# Patient Record
Sex: Female | Born: 1940
Health system: Southern US, Community
[De-identification: ages and names within clinical notes are randomized; demographics above are authoritative.]

## PROBLEM LIST (undated history)

## (undated) DIAGNOSIS — I739 Peripheral vascular disease, unspecified: Secondary | ICD-10-CM

## (undated) DIAGNOSIS — I809 Phlebitis and thrombophlebitis of unspecified site: Secondary | ICD-10-CM

## (undated) DIAGNOSIS — J189 Pneumonia, unspecified organism: Secondary | ICD-10-CM

## (undated) DIAGNOSIS — K219 Gastro-esophageal reflux disease without esophagitis: Secondary | ICD-10-CM

## (undated) DIAGNOSIS — I10 Essential (primary) hypertension: Secondary | ICD-10-CM

## (undated) DIAGNOSIS — D649 Anemia, unspecified: Secondary | ICD-10-CM

## (undated) DIAGNOSIS — Z87828 Personal history of other (healed) physical injury and trauma: Secondary | ICD-10-CM

## (undated) DIAGNOSIS — T7840XA Allergy, unspecified, initial encounter: Secondary | ICD-10-CM

## (undated) DIAGNOSIS — M1990 Unspecified osteoarthritis, unspecified site: Secondary | ICD-10-CM

## (undated) DIAGNOSIS — L0390 Cellulitis, unspecified: Secondary | ICD-10-CM

## (undated) DIAGNOSIS — S46009A Unspecified injury of muscle(s) and tendon(s) of the rotator cuff of unspecified shoulder, initial encounter: Secondary | ICD-10-CM

## (undated) HISTORY — DX: Phlebitis and thrombophlebitis of unspecified site: I80.9

## (undated) HISTORY — PX: COLONOSCOPY: SHX174

## (undated) HISTORY — DX: Allergy, unspecified, initial encounter: T78.40XA

## (undated) HISTORY — DX: Essential (primary) hypertension: I10

## (undated) HISTORY — DX: Pneumonia, unspecified organism: J18.9

## (undated) HISTORY — DX: Unspecified injury of muscle(s) and tendon(s) of the rotator cuff of unspecified shoulder, initial encounter: S46.009A

## (undated) HISTORY — DX: Personal history of other (healed) physical injury and trauma: Z87.828

---

## 1979-12-13 HISTORY — PX: ABDOMINAL HYSTERECTOMY: SHX81

## 2005-01-18 ENCOUNTER — Ambulatory Visit: Payer: Self-pay

## 2005-03-17 ENCOUNTER — Ambulatory Visit: Payer: Self-pay

## 2005-12-02 ENCOUNTER — Ambulatory Visit: Payer: Self-pay

## 2006-05-10 ENCOUNTER — Ambulatory Visit: Payer: Self-pay | Admitting: Internal Medicine

## 2006-07-18 ENCOUNTER — Ambulatory Visit: Payer: Self-pay | Admitting: Gastroenterology

## 2007-01-12 HISTORY — PX: OTHER SURGICAL HISTORY: SHX169

## 2007-04-09 ENCOUNTER — Ambulatory Visit: Payer: Self-pay | Admitting: Internal Medicine

## 2008-05-16 ENCOUNTER — Ambulatory Visit: Payer: Self-pay | Admitting: Internal Medicine

## 2008-05-16 ENCOUNTER — Ambulatory Visit: Payer: Self-pay | Admitting: Family Medicine

## 2008-08-25 ENCOUNTER — Ambulatory Visit: Payer: Self-pay | Admitting: Internal Medicine

## 2010-11-08 ENCOUNTER — Ambulatory Visit: Payer: Self-pay | Admitting: Internal Medicine

## 2011-09-25 ENCOUNTER — Ambulatory Visit: Payer: Self-pay | Admitting: Orthopedic Surgery

## 2011-12-03 ENCOUNTER — Emergency Department: Payer: Self-pay | Admitting: Emergency Medicine

## 2012-12-12 DIAGNOSIS — I809 Phlebitis and thrombophlebitis of unspecified site: Secondary | ICD-10-CM

## 2012-12-12 HISTORY — DX: Phlebitis and thrombophlebitis of unspecified site: I80.9

## 2013-08-28 ENCOUNTER — Encounter: Payer: Self-pay | Admitting: General Surgery

## 2013-08-28 ENCOUNTER — Ambulatory Visit (INDEPENDENT_AMBULATORY_CARE_PROVIDER_SITE_OTHER): Payer: Medicare Other | Admitting: General Surgery

## 2013-08-28 VITALS — BP 150/90 | HR 84 | Resp 16 | Ht 64.0 in | Wt 208.0 lb

## 2013-08-28 DIAGNOSIS — I83893 Varicose veins of bilateral lower extremities with other complications: Secondary | ICD-10-CM

## 2013-08-28 NOTE — Progress Notes (Signed)
Patient ID: Ann Spencer, female   DOB: 05-04-1941, 72 y.o.   MRN: [id number]  Chief Complaint  Patient presents with  . Other    phlebitis    HPI Ann Spencer is a 72 y.o. female. Here today for lower leg evaluation.  Was treated with meloxicam at [facility] for phlebitis in both legs left > right on 08-02-13. States her lower legs were red swollen, inflamed and sore. She states they are still sore and not as inflamed. She continues to wear her compression hose.   HPI  Past Medical History  Diagnosis Date  . Phlebitis 2014    Past Surgical History  Procedure Laterality Date  . Abdominal hysterectomy  1981  . Vein closure Bilateral Feb 2008    History reviewed. No pertinent family history.  Social History History  Substance Use Topics  . Smoking status: Former Smoker -- 30 years    Quit date: 12/12/1993  . Smokeless tobacco: Never Used  . Alcohol Use: No    Allergies  Allergen Reactions  . Penicillins     Current Outpatient Prescriptions  Medication Sig Dispense Refill  . aspirin 81 MG tablet Take 81 mg by mouth daily.      . Calcium Carbonate (CALTRATE 600 PO) Take 1 tablet by mouth daily.      . ferrous sulfate 325 (65 FE) MG EC tablet Take 325 mg by mouth daily with breakfast.      . Multiple Vitamin (MULTIVITAMIN) capsule Take 1 capsule by mouth daily.      Marland Kitchen omeprazole (PRILOSEC) 20 MG capsule Take 20 mg by mouth daily.       No current facility-administered medications for this visit.    Review of Systems Review of Systems  Constitutional: Negative.   Respiratory: Negative.   Cardiovascular: Negative.     Blood pressure 150/90, pulse 84, resp. rate 16, height 5\' 4"  (1.626 m), weight 208 lb (94.348 kg).  Physical Exam Physical Exam  Constitutional: She is oriented to person, place, and time. She appears well-developed and well-nourished.  Neck: No tracheal deviation present. No thyromegaly present.  Cardiovascular: Normal rate, regular  rhythm and normal heart sounds.   Pulses:      Dorsalis pedis pulses are 2+ on the right side, and 2+ on the left side.       Posterior tibial pulses are 2+ on the right side, and 2+ on the left side.  Edema noted left leg.  Left side inner aspect has an area of induration with mild discoloration and tenderness.  Pulmonary/Chest: Effort normal and breath sounds normal.  Neurological: She is alert and oriented to person, place, and time.  Skin: Skin is warm and dry.    Data Reviewed Duplex study of left leg shows no DVT but she has recanalised GSV with reflux.  Assessment    Stasis dermatitis left leg with potential for ulceration.      Plan    Discussed Ann Spencer boot -she was agreeable. This is to be changed next week. Will reassess in 2 weeks.      Venous Duplex study of left lower extremity. Was performed. The deep system -CFV,FV and PV- are all normal with no clots seen, normal compression and doppler flow . Left GSV has recanalised and has reflux in excess of 3.75 secs.  LSV is normal.  Ann Spencer G 09/03/2013, 7:57 AM

## 2013-08-28 NOTE — Patient Instructions (Addendum)
The patient is aware to call back for any questions or concerns. Left leg Unna boot change in 1 week

## 2013-09-03 ENCOUNTER — Encounter: Payer: Self-pay | Admitting: General Surgery

## 2013-09-03 DIAGNOSIS — I8393 Asymptomatic varicose veins of bilateral lower extremities: Secondary | ICD-10-CM | POA: Insufficient documentation

## 2013-09-04 ENCOUNTER — Ambulatory Visit (INDEPENDENT_AMBULATORY_CARE_PROVIDER_SITE_OTHER): Payer: Medicare Other | Admitting: *Deleted

## 2013-09-04 DIAGNOSIS — I83893 Varicose veins of bilateral lower extremities with other complications: Secondary | ICD-10-CM

## 2013-09-04 NOTE — Progress Notes (Signed)
The patient came in today for unna boot dressing change.  Left  leg were washed with soap and water.  Unna boot, kerlix and coban applied.  Edema improving.The area of concern is improving. Less redness noted. Follow up as scheduled.

## 2013-09-04 NOTE — Patient Instructions (Signed)
The patient is aware to call back for any questions or concerns.  

## 2013-09-11 ENCOUNTER — Telehealth: Payer: Self-pay | Admitting: *Deleted

## 2013-09-11 ENCOUNTER — Ambulatory Visit (INDEPENDENT_AMBULATORY_CARE_PROVIDER_SITE_OTHER): Payer: Medicare Other | Admitting: General Surgery

## 2013-09-11 ENCOUNTER — Encounter: Payer: Self-pay | Admitting: General Surgery

## 2013-09-11 VITALS — BP 164/94 | HR 78 | Resp 16 | Ht 64.0 in | Wt 206.0 lb

## 2013-09-11 DIAGNOSIS — I83893 Varicose veins of bilateral lower extremities with other complications: Secondary | ICD-10-CM

## 2013-09-11 NOTE — Telephone Encounter (Signed)
Need to talk with her about appointments

## 2013-09-11 NOTE — Progress Notes (Signed)
Patient ID: Ann Spencer, female   DOB: Mar 11, 1941, 72 y.o.   MRN: [id number]  Chief Complaint  Patient presents with  . Follow-up    2 week follow up of unnaboot    HPI Ann Spencer is a 72 y.o. female who presents for a 2 week follow up of unnaboot. The patient states the soreness is almost completely gone. She is doing well overall.   HPI  Past Medical History  Diagnosis Date  . Phlebitis 2014    Past Surgical History  Procedure Laterality Date  . Abdominal hysterectomy  1981  . Vein closure Bilateral Feb 2008    History reviewed. No pertinent family history.  Social History History  Substance Use Topics  . Smoking status: Former Smoker -- 30 years    Quit date: 12/12/1993  . Smokeless tobacco: Never Used  . Alcohol Use: No    Allergies  Allergen Reactions  . Penicillins     Current Outpatient Prescriptions  Medication Sig Dispense Refill  . aspirin 81 MG tablet Take 81 mg by mouth daily.      . Calcium Carbonate (CALTRATE 600 PO) Take 1 tablet by mouth daily.      . ferrous sulfate 325 (65 FE) MG EC tablet Take 325 mg by mouth daily with breakfast.      . Multiple Vitamin (MULTIVITAMIN) capsule Take 1 capsule by mouth daily.      Marland Kitchen omeprazole (PRILOSEC) 20 MG capsule Take 20 mg by mouth daily.       No current facility-administered medications for this visit.    Review of Systems Review of Systems  Constitutional: Negative.   Respiratory: Negative.   Cardiovascular: Negative.     Blood pressure 164/94, pulse 78, resp. rate 16, height 5\' 4"  (1.626 m), weight 206 lb (93.441 kg).  Physical Exam Physical Exam  Constitutional: She is oriented to person, place, and time. She appears well-developed and well-nourished.  Neurological: She is alert and oriented to person, place, and time.  Skin: Skin is warm and dry.  Left leg with a complete resolution of edema. The involved medial skin induration and redness markedly improved. Feet are warm with strong  pedal pulses.  Data Reviewed Recent left leg Duplex study showed recanalisation of left GSV-prior RF ablation few yrs ago. The GSV has abnormal reflux.   Assessment    Recurrence of complications associated with GSV incompetence. VV CEAP class 5.    Plan    Repeat RF ablation of GSV. Discussed with pt and she is agreeable.       Su Duma G 09/11/2013, 6:36 PM

## 2013-09-11 NOTE — Patient Instructions (Addendum)
Patient advised to continue the use of compression hose.

## 2013-09-12 NOTE — Telephone Encounter (Signed)
Appointments are ok for the patient for the venous procedure and f/u ultrasound.

## 2013-10-15 ENCOUNTER — Ambulatory Visit (INDEPENDENT_AMBULATORY_CARE_PROVIDER_SITE_OTHER): Payer: Medicare Other | Admitting: General Surgery

## 2013-10-15 ENCOUNTER — Encounter: Payer: Self-pay | Admitting: General Surgery

## 2013-10-15 VITALS — BP 120/78 | HR 76 | Resp 14 | Ht 66.0 in | Wt 208.0 lb

## 2013-10-15 DIAGNOSIS — I83893 Varicose veins of bilateral lower extremities with other complications: Secondary | ICD-10-CM

## 2013-10-15 NOTE — Patient Instructions (Addendum)
Appointment for office procedure as discussed.

## 2013-10-15 NOTE — Progress Notes (Signed)
Patient ID: Ann Spencer, female   DOB: June 21, 1941, 72 y.o.   MRN: [id number] Patient here today for venous closure procedure left leg.    Patient was prepared for performing the venous ablation. After the left thigh was prepped and draped out ultrasound was used to locate the saphenous vein in the midthigh area where they became superficial. After instillation of local anesthetic attempt was made to enter this vein with a needle and this was to unsuccessful given that there was significant fibrotic thickening of the vein and the needle kept sliding off after needle was successfully entered into the vein the ligament was noted to be extremely small and the needle would not stay within.  At this point it is felt that this vein had been so fibrotic and narrowed in its psyllium and is not suitable for repeat ablation. Likely the best option is to ligate the greater saphenous vein at the saphenofemoral junction. This was discussed with the patient she is agreeable.

## 2013-10-17 ENCOUNTER — Ambulatory Visit: Payer: Self-pay | Admitting: General Surgery

## 2013-10-23 ENCOUNTER — Encounter: Payer: Self-pay | Admitting: General Surgery

## 2013-11-06 ENCOUNTER — Encounter: Payer: Self-pay | Admitting: General Surgery

## 2013-11-06 ENCOUNTER — Ambulatory Visit (INDEPENDENT_AMBULATORY_CARE_PROVIDER_SITE_OTHER): Payer: Medicare Other | Admitting: General Surgery

## 2013-11-06 VITALS — BP 126/70 | HR 84 | Resp 14 | Ht 65.0 in | Wt 208.0 lb

## 2013-11-06 DIAGNOSIS — I83893 Varicose veins of bilateral lower extremities with other complications: Secondary | ICD-10-CM

## 2013-11-06 NOTE — Patient Instructions (Addendum)
Stay moving throughout today. Wear compression hose. Don't sit for long periods of time. Tramadol prescription 1 tab every 6 hours #20 given to take as needed for pain

## 2013-11-06 NOTE — Progress Notes (Signed)
Left leg ligation saphenous vein.  Procedure: Ligaion left GSV near SF junction.  Anesthetic: 1% xylocaine mixed with 0.5% marcaine, 13ml.  Prep- Chloraprep.  Procedure: Transverse incision 4 cm made over the proximal GSV on left identified with [place]. Dissected down to expose the GSV. Bleeding controlled with cautery.                     The GSV was clamped, cut and doubly ligated with 3-0 vicryl on both sides. Deeper tissue closed with 3-0 vicryl. Skin closed with 4-0 vicryl subcuticular stitch.  Dressing: Dermabond.  Complications : none.  Rx given for Tramadol 50 mg , 1 q6h prn, # 20.

## 2013-11-14 ENCOUNTER — Ambulatory Visit: Payer: Medicare Other | Admitting: General Surgery

## 2013-11-20 ENCOUNTER — Encounter: Payer: Self-pay | Admitting: General Surgery

## 2013-11-20 ENCOUNTER — Ambulatory Visit (INDEPENDENT_AMBULATORY_CARE_PROVIDER_SITE_OTHER): Payer: Medicare Other | Admitting: General Surgery

## 2013-11-20 ENCOUNTER — Encounter: Payer: Self-pay | Admitting: *Deleted

## 2013-11-20 VITALS — BP 130/68 | HR 76 | Resp 12 | Ht 65.0 in | Wt 204.0 lb

## 2013-11-20 DIAGNOSIS — I83893 Varicose veins of bilateral lower extremities with other complications: Secondary | ICD-10-CM

## 2013-11-20 NOTE — Patient Instructions (Signed)
Patient to return in 2 days  

## 2013-11-20 NOTE — Progress Notes (Signed)
This is a 72 year old female here today for her post op left leg ligation saphenous vein. Patient states her right ankle is red and swollen.  Left leg is healing well.  Exam shows clean healing incision left groin/upper thight area. No signs of infection. Left thigh and leg with redness or tenderness. In right leg posterolateral aboce ankle there is a 5cm area mild erythema. Skin induration and tenderness. Consistent with stasi dermatitis.  Pt advise use of una boot and compression . 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

## 2013-11-22 ENCOUNTER — Ambulatory Visit (INDEPENDENT_AMBULATORY_CARE_PROVIDER_SITE_OTHER): Payer: Medicare Other | Admitting: General Surgery

## 2013-11-22 ENCOUNTER — Encounter: Payer: Self-pay | Admitting: General Surgery

## 2013-11-22 VITALS — BP 124/70 | HR 80 | Resp 14 | Ht 66.0 in | Wt 206.0 lb

## 2013-11-22 DIAGNOSIS — I83893 Varicose veins of bilateral lower extremities with other complications: Secondary | ICD-10-CM

## 2013-11-22 NOTE — Patient Instructions (Signed)
Patient to return in one week to see nurse

## 2013-11-22 NOTE — Progress Notes (Signed)
This is a 72 year old female here today for right leg unnaboot. Unaboot applied to the right leg from the foot to just below the knee and reenforced with a Coban. She is to followup next week on Thursday 11/28/13

## 2013-11-28 ENCOUNTER — Ambulatory Visit (INDEPENDENT_AMBULATORY_CARE_PROVIDER_SITE_OTHER): Payer: Medicare Other | Admitting: General Surgery

## 2013-11-28 ENCOUNTER — Encounter: Payer: Self-pay | Admitting: General Surgery

## 2013-11-28 VITALS — BP 120/70 | HR 76 | Resp 14 | Ht 66.0 in | Wt 212.0 lb

## 2013-11-28 DIAGNOSIS — I83893 Varicose veins of bilateral lower extremities with other complications: Secondary | ICD-10-CM

## 2013-11-28 NOTE — Progress Notes (Signed)
This is a 72 year old female here today for her unna boots.  Right leg stasis has improved. The area of skin induration lateral above ankle has resolved.  Pt can now resume her compression hose.

## 2013-11-28 NOTE — Patient Instructions (Addendum)
Patient to return in one month. 

## 2013-11-29 ENCOUNTER — Encounter: Payer: Self-pay | Admitting: General Surgery

## 2013-12-12 DIAGNOSIS — S46009A Unspecified injury of muscle(s) and tendon(s) of the rotator cuff of unspecified shoulder, initial encounter: Secondary | ICD-10-CM

## 2013-12-12 HISTORY — DX: Unspecified injury of muscle(s) and tendon(s) of the rotator cuff of unspecified shoulder, initial encounter: S46.009A

## 2013-12-20 LAB — HM MAMMOGRAPHY: HM MAMMO: NORMAL

## 2013-12-23 ENCOUNTER — Encounter: Payer: Self-pay | Admitting: General Surgery

## 2013-12-23 ENCOUNTER — Ambulatory Visit (INDEPENDENT_AMBULATORY_CARE_PROVIDER_SITE_OTHER): Payer: Medicare Other | Admitting: General Surgery

## 2013-12-23 VITALS — BP 140/76 | HR 74 | Resp 14 | Ht 66.0 in

## 2013-12-23 DIAGNOSIS — I83893 Varicose veins of bilateral lower extremities with other complications: Secondary | ICD-10-CM

## 2013-12-23 NOTE — Patient Instructions (Addendum)
Patient to count ine to use her compression hose. Patient to return in three months -exam and venous Duplex.

## 2013-12-23 NOTE — Progress Notes (Signed)
This is a 73 year old female here today following up from left GSV ligation. Patient states her right leg is doing well. Pt reports no leg pain. Continues to wear her compression hose. Exam shows no visible VV in left thigh/leg. Scant edema bilateral.  Left groin incision is well healed. Mild skin change in left leg lower 1/3 unchanged from before-non tender.  Venous insufficiency, under control,atbpreent with comperssion hose.

## 2013-12-24 ENCOUNTER — Encounter: Payer: Self-pay | Admitting: General Surgery

## 2013-12-27 DIAGNOSIS — L738 Other specified follicular disorders: Secondary | ICD-10-CM | POA: Diagnosis not present

## 2013-12-27 DIAGNOSIS — L678 Other hair color and hair shaft abnormalities: Secondary | ICD-10-CM | POA: Diagnosis not present

## 2014-01-07 DIAGNOSIS — J209 Acute bronchitis, unspecified: Secondary | ICD-10-CM | POA: Diagnosis not present

## 2014-01-09 ENCOUNTER — Emergency Department: Payer: Self-pay | Admitting: Emergency Medicine

## 2014-01-09 DIAGNOSIS — S0100XA Unspecified open wound of scalp, initial encounter: Secondary | ICD-10-CM | POA: Diagnosis not present

## 2014-01-09 DIAGNOSIS — S0990XA Unspecified injury of head, initial encounter: Secondary | ICD-10-CM | POA: Diagnosis not present

## 2014-01-09 DIAGNOSIS — S0083XA Contusion of other part of head, initial encounter: Secondary | ICD-10-CM | POA: Diagnosis not present

## 2014-01-09 DIAGNOSIS — S0993XA Unspecified injury of face, initial encounter: Secondary | ICD-10-CM | POA: Diagnosis not present

## 2014-01-09 DIAGNOSIS — T07XXXA Unspecified multiple injuries, initial encounter: Secondary | ICD-10-CM | POA: Diagnosis not present

## 2014-01-09 DIAGNOSIS — S0003XA Contusion of scalp, initial encounter: Secondary | ICD-10-CM | POA: Diagnosis not present

## 2014-01-09 DIAGNOSIS — S199XXA Unspecified injury of neck, initial encounter: Secondary | ICD-10-CM | POA: Diagnosis not present

## 2014-01-09 DIAGNOSIS — S0180XA Unspecified open wound of other part of head, initial encounter: Secondary | ICD-10-CM | POA: Diagnosis not present

## 2014-02-14 ENCOUNTER — Emergency Department: Payer: Self-pay | Admitting: Emergency Medicine

## 2014-02-14 DIAGNOSIS — Z87891 Personal history of nicotine dependence: Secondary | ICD-10-CM | POA: Diagnosis not present

## 2014-02-14 DIAGNOSIS — R05 Cough: Secondary | ICD-10-CM | POA: Diagnosis not present

## 2014-02-14 DIAGNOSIS — J209 Acute bronchitis, unspecified: Secondary | ICD-10-CM | POA: Diagnosis not present

## 2014-02-14 DIAGNOSIS — R059 Cough, unspecified: Secondary | ICD-10-CM | POA: Diagnosis not present

## 2014-02-14 DIAGNOSIS — R0602 Shortness of breath: Secondary | ICD-10-CM | POA: Diagnosis not present

## 2014-02-14 LAB — CBC
HCT: 40.3 % (ref 35.0–47.0)
HGB: 13.4 g/dL (ref 12.0–16.0)
MCH: 32.8 pg (ref 26.0–34.0)
MCHC: 33.2 g/dL (ref 32.0–36.0)
MCV: 99 fL (ref 80–100)
Platelet: 212 10*3/uL (ref 150–440)
RBC: 4.08 10*6/uL (ref 3.80–5.20)
RDW: 12.6 % (ref 11.5–14.5)
WBC: 6.2 10*3/uL (ref 3.6–11.0)

## 2014-02-14 LAB — BASIC METABOLIC PANEL
ANION GAP: 8 (ref 7–16)
BUN: 13 mg/dL (ref 4–21)
BUN: 13 mg/dL (ref 7–18)
CALCIUM: 9.4 mg/dL (ref 8.5–10.1)
CO2: 26 mmol/L (ref 21–32)
Chloride: 104 mmol/L (ref 98–107)
Creatinine: 0.8 mg/dL (ref 0.5–1.1)
Creatinine: 0.8 mg/dL (ref 0.60–1.30)
EGFR (African American): >60
GLUCOSE: 105 mg/dL — AB (ref 65–99)
Osmolality: 276 (ref 275–301)
Potassium: 4.2 mmol/L (ref 3.4–5.3)
Potassium: 4.2 mmol/L (ref 3.5–5.1)
Sodium: 138 mmol/L (ref 136–145)
Sodium: 138 mmol/L (ref 137–147)

## 2014-02-14 LAB — TROPONIN I: Troponin-I: <0.02 ng/mL

## 2014-02-14 LAB — CBC AND DIFFERENTIAL
HCT: 40 % (ref 36–46)
Hemoglobin: 13.4 g/dL (ref 12.0–16.0)
PLATELETS: 212 10*3/uL (ref 150–399)
WBC: 6.2 10*3/mL

## 2014-02-14 LAB — PRO B NATRIURETIC PEPTIDE: B-TYPE NATIURETIC PEPTID: 51 pg/mL (ref 0–125)

## 2014-03-24 ENCOUNTER — Ambulatory Visit (INDEPENDENT_AMBULATORY_CARE_PROVIDER_SITE_OTHER): Payer: Medicare Other | Admitting: General Surgery

## 2014-03-24 ENCOUNTER — Encounter: Payer: Self-pay | Admitting: General Surgery

## 2014-03-24 VITALS — BP 140/80 | HR 68 | Resp 12 | Ht 66.0 in | Wt 211.0 lb

## 2014-03-24 DIAGNOSIS — I83893 Varicose veins of bilateral lower extremities with other complications: Secondary | ICD-10-CM | POA: Diagnosis not present

## 2014-03-24 NOTE — Patient Instructions (Signed)
Patient to return in six months  

## 2014-03-24 NOTE — Progress Notes (Signed)
This is a 72 year old female here today following up from left GSV ligation. Patient states her right leg is doing well. She has not had any left leg symptoms since the procedure. Left leg shows mild stasis changes in inner area above ankle-no skin induration or tenderness. VV in thigh appear markedly reduced. No edema noted. Overall good results.  Continue with compression hose. RTC in 6 mos.

## 2014-03-26 ENCOUNTER — Encounter: Payer: Self-pay | Admitting: General Surgery

## 2014-06-26 DIAGNOSIS — M5030 Other cervical disc degeneration, unspecified cervical region: Secondary | ICD-10-CM | POA: Diagnosis not present

## 2014-06-26 DIAGNOSIS — M75120 Complete rotator cuff tear or rupture of unspecified shoulder, not specified as traumatic: Secondary | ICD-10-CM | POA: Diagnosis not present

## 2014-07-11 DIAGNOSIS — J019 Acute sinusitis, unspecified: Secondary | ICD-10-CM | POA: Diagnosis not present

## 2014-07-24 DIAGNOSIS — M75120 Complete rotator cuff tear or rupture of unspecified shoulder, not specified as traumatic: Secondary | ICD-10-CM | POA: Diagnosis not present

## 2014-09-15 ENCOUNTER — Ambulatory Visit: Payer: Self-pay | Admitting: General Surgery

## 2014-10-22 DIAGNOSIS — R05 Cough: Secondary | ICD-10-CM | POA: Diagnosis not present

## 2014-10-22 DIAGNOSIS — J209 Acute bronchitis, unspecified: Secondary | ICD-10-CM | POA: Diagnosis not present

## 2014-10-29 ENCOUNTER — Encounter: Payer: Self-pay | Admitting: *Deleted

## 2015-01-23 DIAGNOSIS — M5032 Other cervical disc degeneration, mid-cervical region: Secondary | ICD-10-CM | POA: Diagnosis not present

## 2015-01-23 DIAGNOSIS — M7582 Other shoulder lesions, left shoulder: Secondary | ICD-10-CM | POA: Diagnosis not present

## 2015-01-27 ENCOUNTER — Ambulatory Visit: Payer: Self-pay | Admitting: Orthopedic Surgery

## 2015-01-27 DIAGNOSIS — M47892 Other spondylosis, cervical region: Secondary | ICD-10-CM | POA: Diagnosis not present

## 2015-01-27 DIAGNOSIS — M2578 Osteophyte, vertebrae: Secondary | ICD-10-CM | POA: Diagnosis not present

## 2015-01-27 DIAGNOSIS — M5032 Other cervical disc degeneration, mid-cervical region: Secondary | ICD-10-CM | POA: Diagnosis not present

## 2015-01-27 DIAGNOSIS — M19012 Primary osteoarthritis, left shoulder: Secondary | ICD-10-CM | POA: Diagnosis not present

## 2015-01-27 DIAGNOSIS — M5030 Other cervical disc degeneration, unspecified cervical region: Secondary | ICD-10-CM | POA: Diagnosis not present

## 2015-01-27 DIAGNOSIS — M75112 Incomplete rotator cuff tear or rupture of left shoulder, not specified as traumatic: Secondary | ICD-10-CM | POA: Diagnosis not present

## 2015-01-27 DIAGNOSIS — M4802 Spinal stenosis, cervical region: Secondary | ICD-10-CM | POA: Diagnosis not present

## 2015-01-27 DIAGNOSIS — M5031 Other cervical disc degeneration,  high cervical region: Secondary | ICD-10-CM | POA: Diagnosis not present

## 2015-02-05 DIAGNOSIS — M5032 Other cervical disc degeneration, mid-cervical region: Secondary | ICD-10-CM | POA: Diagnosis not present

## 2015-02-05 DIAGNOSIS — M75122 Complete rotator cuff tear or rupture of left shoulder, not specified as traumatic: Secondary | ICD-10-CM | POA: Diagnosis not present

## 2015-02-13 DIAGNOSIS — M75122 Complete rotator cuff tear or rupture of left shoulder, not specified as traumatic: Secondary | ICD-10-CM | POA: Diagnosis not present

## 2015-02-18 ENCOUNTER — Encounter: Payer: Self-pay | Admitting: General Surgery

## 2015-02-18 ENCOUNTER — Ambulatory Visit (INDEPENDENT_AMBULATORY_CARE_PROVIDER_SITE_OTHER): Payer: Medicare Other | Admitting: General Surgery

## 2015-02-18 VITALS — BP 160/92 | HR 97 | Resp 14 | Ht 66.0 in | Wt 223.0 lb

## 2015-02-18 DIAGNOSIS — I8312 Varicose veins of left lower extremity with inflammation: Secondary | ICD-10-CM

## 2015-02-18 NOTE — Progress Notes (Signed)
Patient ID: Ann Spencer, female   DOB: [birth year], 74 y.o.   MRN: [id number]  Chief Complaint  Patient presents with  . Follow-up    left leg knot    HPI Ann Spencer is a 74 y.o. female. here today for a evalaution of a knot on her left leg. Patient noticed this about 2 weeks ago. She reports the area as sore to touch. She does complain of right knee pain and swelling that has been ongoing for a while. She continues to wear her compression hose.   HPI  Past Medical History  Diagnosis Date  . Phlebitis 2014  . Rotator cuff injury 12/2013    Past Surgical History  Procedure Laterality Date  . Abdominal hysterectomy  1981  . Vein closure Bilateral Feb 2008    History reviewed. No pertinent family history.  Social History History  Substance Use Topics  . Smoking status: Former Smoker -- 30 years    Quit date: 12/12/1993  . Smokeless tobacco: Never Used  . Alcohol Use: No    Allergies  Allergen Reactions  . Penicillins     Current Outpatient Prescriptions  Medication Sig Dispense Refill  . aspirin 81 MG tablet Take 81 mg by mouth daily.    . Calcium Carb-Cholecalciferol [phone] MG-UNIT TABS Take 1 tablet by mouth daily.    . Calcium Carbonate (CALTRATE 600 PO) Take 1 tablet by mouth daily.    . ferrous sulfate 325 (65 FE) MG EC tablet Take 325 mg by mouth daily with breakfast.    . Multiple Vitamin (MULTIVITAMIN) capsule Take 1 capsule by mouth daily.    Marland Kitchen omeprazole (PRILOSEC) 20 MG capsule Take 20 mg by mouth daily.     No current facility-administered medications for this visit.    Review of Systems Review of Systems  Constitutional: Negative.   Respiratory: Negative.   Cardiovascular: Positive for leg swelling (right knee).    Blood pressure 160/92, pulse 97, resp. rate 14, height 5\' 6"  (1.676 m), weight 223 lb (101.152 kg).  Physical Exam Physical Exam  Constitutional: She is oriented to person, place, and time. She appears well-developed and  well-nourished.  Eyes: Conjunctivae are normal. No scleral icterus.  Cardiovascular: Regular rhythm.   Pulses:      Dorsalis pedis pulses are 2+ on the right side, and 2+ on the left side.       Posterior tibial pulses are 2+ on the right side, and 2+ on the left side.  Scant edema right leg. Moderate edema left leg. Brown discolored tender indurated skin inner aspect left leg lower one third.  Neurological: She is alert and oriented to person, place, and time.  Skin: Skin is warm and dry.    Data Reviewed none  Assessment    Stasis dermatitis left leg, potential for ulceration. She had similar episode in right leg before and responded to [place] boot and compression.    Plan    Una boot left leg applied with coban compression.Patient to return weekly for una boot change.  To see me in 3 weeks       Kileen Lange G 02/18/2015, 6:21 PM

## 2015-02-18 NOTE — Patient Instructions (Addendum)
Patient to return in one week to see the nurse

## 2015-02-20 ENCOUNTER — Encounter: Payer: Self-pay | Admitting: Nurse Practitioner

## 2015-02-20 ENCOUNTER — Ambulatory Visit (INDEPENDENT_AMBULATORY_CARE_PROVIDER_SITE_OTHER): Payer: Medicare Other | Admitting: Nurse Practitioner

## 2015-02-20 VITALS — BP 155/82 | HR 87 | Temp 97.7°F | Resp 14 | Ht 65.0 in | Wt 223.8 lb

## 2015-02-20 DIAGNOSIS — I10 Essential (primary) hypertension: Secondary | ICD-10-CM | POA: Diagnosis not present

## 2015-02-20 DIAGNOSIS — Z23 Encounter for immunization: Secondary | ICD-10-CM | POA: Diagnosis not present

## 2015-02-20 DIAGNOSIS — M75102 Unspecified rotator cuff tear or rupture of left shoulder, not specified as traumatic: Secondary | ICD-10-CM | POA: Diagnosis not present

## 2015-02-20 DIAGNOSIS — Z7189 Other specified counseling: Secondary | ICD-10-CM

## 2015-02-20 DIAGNOSIS — I8393 Asymptomatic varicose veins of bilateral lower extremities: Secondary | ICD-10-CM | POA: Diagnosis not present

## 2015-02-20 DIAGNOSIS — Z7689 Persons encountering health services in other specified circumstances: Secondary | ICD-10-CM

## 2015-02-20 LAB — COMPREHENSIVE METABOLIC PANEL
ALBUMIN: 4.2 g/dL (ref 3.5–5.2)
ALK PHOS: 53 U/L (ref 39–117)
ALT: 26 U/L (ref 0–35)
AST: 19 U/L (ref 0–37)
BUN: 17 mg/dL (ref 6–23)
CALCIUM: 9.7 mg/dL (ref 8.4–10.5)
CHLORIDE: 100 meq/L (ref 96–112)
CO2: 32 meq/L (ref 19–32)
Creatinine, Ser: 0.81 mg/dL (ref 0.40–1.20)
GFR: 73.78 mL/min (ref >60.00)
GLUCOSE: 102 mg/dL — AB (ref 70–99)
POTASSIUM: 4.5 meq/L (ref 3.5–5.1)
Sodium: 136 mEq/L (ref 135–145)
TOTAL PROTEIN: 7.4 g/dL (ref 6.0–8.3)
Total Bilirubin: 0.5 mg/dL (ref 0.2–1.2)

## 2015-02-20 LAB — CBC WITH DIFFERENTIAL/PLATELET
BASOS PCT: 0.7 % (ref 0.0–3.0)
Basophils Absolute: 0 10*3/uL (ref 0.0–0.1)
EOS PCT: 3.1 % (ref 0.0–5.0)
Eosinophils Absolute: 0.1 10*3/uL (ref 0.0–0.7)
HCT: 44.2 % (ref 36.0–46.0)
Hemoglobin: 15.1 g/dL — ABNORMAL HIGH (ref 12.0–15.0)
LYMPHS PCT: 26.5 % (ref 12.0–46.0)
Lymphs Abs: 1.1 10*3/uL (ref 0.7–4.0)
MCHC: 34.1 g/dL (ref 30.0–36.0)
MCV: 97.8 fl (ref 78.0–100.0)
MONOS PCT: 9.7 % (ref 3.0–12.0)
Monocytes Absolute: 0.4 10*3/uL (ref 0.1–1.0)
Neutro Abs: 2.4 10*3/uL (ref 1.4–7.7)
Neutrophils Relative %: 60 % (ref 43.0–77.0)
PLATELETS: 191 10*3/uL (ref 150.0–400.0)
RBC: 4.52 Mil/uL (ref 3.87–5.11)
RDW: 12.6 % (ref 11.5–15.5)
WBC: 4 10*3/uL (ref 4.0–10.5)

## 2015-02-20 MED ORDER — HYDROCHLOROTHIAZIDE 12.5 MG PO TABS: 12.5000 mg | ORAL_TABLET | Freq: Every day | ORAL | Status: DC

## 2015-02-20 NOTE — Progress Notes (Signed)
Pre visit review using our clinic review tool, if applicable. No additional management support is needed unless otherwise documented below in the visit note. 

## 2015-02-20 NOTE — Progress Notes (Signed)
Subjective:    Patient ID: Ann Spencer, female    DOB: 1941-11-07, 75 y.o.   MRN: [id number]  HPI  Ann Spencer is a 74 yo female establishing care today. She is accompanied by her daughter who helps with her history.   1) New Pt info:   Diet- Eats at work   Exercise- No formal   Immunizations- Needs prevnar   Mammogram- N/A  Pap- Full hysterectomy   Bone Density- 2007 or 2008, takes Caltrate  Colonoscopy- Over 10 years ago   Eye Exam- UTD  Dental Exam- Full upper plate   2) Chronic Problems-  23rd march Probable for surgery see acute...  Phlebitis- Being cared for by Dr. Jamal Collin  3) Acute Problems-  Clearance for shoulder surgery- Rotator cuff on left, left hand dominant   EKG- 6 months ago- [facility]   MRI- 01/27/15- Supraspinatus tear and subscapularis split tear with medial dislocation of biceps long head into split.   * I have personally reviewed the MRI report and the above is the result found.   Other Providers-  Dr. Mack Guise- Shoulder surgeon  Dr. Sharyne Richters- Neck surgery   Dr. Jamal Collin- Veins- currently using compression on left lower leg   Review of Systems  Constitutional: Negative for fever, chills, diaphoresis and fatigue.  HENT: Negative for tinnitus and trouble swallowing.   Eyes: Negative for visual disturbance.  Respiratory: Negative for chest tightness, shortness of breath and wheezing.   Cardiovascular: Negative for chest pain, palpitations and leg swelling.  Gastrointestinal: Negative for nausea, vomiting and diarrhea.  Musculoskeletal: Positive for arthralgias.       Left shoulder pain  Skin: Negative for rash.  Neurological: Negative for dizziness, weakness, numbness and headaches.  Psychiatric/Behavioral: Negative for suicidal ideas and sleep disturbance. The patient is not nervous/anxious.    Past Medical History  Diagnosis Date  . Phlebitis 2014  . Rotator cuff injury 12/2013    History   Social History  . Marital Status: Widowed    Spouse  Name: N/A  . Number of Children: N/A  . Years of Education: N/A   Occupational History  . Not on file.   Social History Main Topics  . Smoking status: Former Smoker -- 30 years    Quit date: 12/12/1993  . Smokeless tobacco: Never Used  . Alcohol Use: 4.2 oz/week    7 Standard drinks or equivalent per week     Comment: Wine   . Drug Use: No  . Sexual Activity: No   Other Topics Concern  . Not on file   Social History Narrative   [place]   Lives with daughter and grandaughter    2 dogs lives inside   Enjoys reading    Past Surgical History  Procedure Laterality Date  . Vein closure Bilateral Feb 2008  . Abdominal hysterectomy  1981    Full    Family History  Problem Relation Age of Onset  . Varicose Veins Brother     Allergies  Allergen Reactions  . Penicillins     Current Outpatient Prescriptions on File Prior to Visit  Medication Sig Dispense Refill  . aspirin 81 MG tablet Take 81 mg by mouth daily.    . Calcium Carb-Cholecalciferol [phone] MG-UNIT TABS Take 1 tablet by mouth daily.    . Calcium Carbonate (CALTRATE 600 PO) Take 1 tablet by mouth daily.    . ferrous sulfate 325 (65 FE) MG EC tablet Take 325 mg by mouth daily with breakfast.    .  Multiple Vitamin (MULTIVITAMIN) capsule Take 1 capsule by mouth daily.    Marland Kitchen omeprazole (PRILOSEC) 20 MG capsule Take 20 mg by mouth daily.     No current facility-administered medications on file prior to visit.      Objective:   Physical Exam  Constitutional: She is oriented to person, place, and time. She appears well-developed and well-nourished. No distress.  BP 155/82 mmHg  Pulse 87  Temp(Src) 97.7 F (36.5 C)  Resp 14  Ht 5\' 5"  (1.651 m)  Wt 223 lb 13.4 oz (101.533 kg)  BMI 37.25 kg/m2  SpO2 96%   HENT:  Head: Normocephalic and atraumatic.  Right Ear: External ear normal.  Left Ear: External ear normal.  Eyes: Right eye exhibits no discharge. Left eye exhibits no discharge. No  scleral icterus.  Neck: Normal range of motion. Neck supple. No thyromegaly present.  Cardiovascular: Normal rate, regular rhythm, normal heart sounds and intact distal pulses.  Exam reveals no gallop and no friction rub.   No murmur heard. Pulmonary/Chest: Effort normal and breath sounds normal. No respiratory distress. She has no wheezes. She has no rales. She exhibits no tenderness.  Musculoskeletal: She exhibits tenderness. She exhibits no edema.  Decreased ROM with weakness and pain in left shoulder anterior and posterior. Having surgery soon.   Lymphadenopathy:    She has no cervical adenopathy.  Neurological: She is alert and oriented to person, place, and time. No cranial nerve deficit. She exhibits normal muscle tone. Coordination normal.  Skin: Skin is warm and dry. No rash noted. She is not diaphoretic. There is erythema.  Left lower leg in Coban today.   Psychiatric: She has a normal mood and affect. Her behavior is normal. Judgment and thought content normal.      Assessment & Plan:

## 2015-02-20 NOTE — Patient Instructions (Addendum)
Please visit the lab before leaving today.   Welcome to [facility]!   Try the HCTZ for you blood pressure.   Nurse visit Next week to check BP.

## 2015-02-23 ENCOUNTER — Telehealth: Payer: Self-pay | Admitting: Nurse Practitioner

## 2015-02-23 NOTE — Telephone Encounter (Signed)
Emmi emailed

## 2015-02-24 ENCOUNTER — Ambulatory Visit (INDEPENDENT_AMBULATORY_CARE_PROVIDER_SITE_OTHER): Payer: Medicare Other | Admitting: *Deleted

## 2015-02-24 DIAGNOSIS — M545 Low back pain: Secondary | ICD-10-CM | POA: Diagnosis not present

## 2015-02-24 DIAGNOSIS — G8929 Other chronic pain: Secondary | ICD-10-CM | POA: Diagnosis not present

## 2015-02-24 DIAGNOSIS — Z7689 Persons encountering health services in other specified circumstances: Secondary | ICD-10-CM | POA: Insufficient documentation

## 2015-02-24 DIAGNOSIS — M75102 Unspecified rotator cuff tear or rupture of left shoulder, not specified as traumatic: Secondary | ICD-10-CM

## 2015-02-24 DIAGNOSIS — Z79899 Other long term (current) drug therapy: Secondary | ICD-10-CM | POA: Diagnosis not present

## 2015-02-24 DIAGNOSIS — M47812 Spondylosis without myelopathy or radiculopathy, cervical region: Secondary | ICD-10-CM | POA: Diagnosis not present

## 2015-02-24 DIAGNOSIS — Z01818 Encounter for other preprocedural examination: Secondary | ICD-10-CM | POA: Diagnosis not present

## 2015-02-24 DIAGNOSIS — I10 Essential (primary) hypertension: Secondary | ICD-10-CM | POA: Insufficient documentation

## 2015-02-24 DIAGNOSIS — M25511 Pain in right shoulder: Secondary | ICD-10-CM | POA: Diagnosis not present

## 2015-02-24 DIAGNOSIS — M75120 Complete rotator cuff tear or rupture of unspecified shoulder, not specified as traumatic: Secondary | ICD-10-CM | POA: Insufficient documentation

## 2015-02-24 DIAGNOSIS — M542 Cervicalgia: Secondary | ICD-10-CM | POA: Diagnosis not present

## 2015-02-24 NOTE — Assessment & Plan Note (Signed)
Having surgery this month with Dr. Mack Guise. Filled out form and faxed to their office. Asked daughter to find out if they need an EKG or can use one within 6 months.

## 2015-02-24 NOTE — Progress Notes (Signed)
Pt presents for EKG, needs for preoperative clearance per Morey Hummingbird. Pt doing well without complaints. EKG performed without difficulty, reviewed by Morey Hummingbird at appointment. Pt notified of results,  verbalized understanding

## 2015-02-24 NOTE — Assessment & Plan Note (Addendum)
Will try HCTZ for fluid and BP. Goal below 150/90.  Lab Results  Component Value Date   CREATININE 0.81 02/20/2015   Lab Results  Component Value Date   K 4.5 02/20/2015

## 2015-02-24 NOTE — Assessment & Plan Note (Signed)
Discussed acute and chronic issues. Reviewed health maintenance measures, PFSHx, and immunizations. Obtain labs for HTN CBC w/ diff and CMET.

## 2015-02-24 NOTE — Assessment & Plan Note (Signed)
Sees Dr. Jamal Collin for evaluation and treatment.

## 2015-02-25 ENCOUNTER — Ambulatory Visit: Payer: Self-pay | Admitting: Orthopedic Surgery

## 2015-02-25 ENCOUNTER — Ambulatory Visit (INDEPENDENT_AMBULATORY_CARE_PROVIDER_SITE_OTHER): Payer: Medicare Other | Admitting: *Deleted

## 2015-02-25 DIAGNOSIS — M25512 Pain in left shoulder: Secondary | ICD-10-CM | POA: Diagnosis not present

## 2015-02-25 DIAGNOSIS — Z01812 Encounter for preprocedural laboratory examination: Secondary | ICD-10-CM | POA: Diagnosis not present

## 2015-02-25 DIAGNOSIS — Z87891 Personal history of nicotine dependence: Secondary | ICD-10-CM | POA: Diagnosis not present

## 2015-02-25 DIAGNOSIS — M79602 Pain in left arm: Secondary | ICD-10-CM | POA: Diagnosis not present

## 2015-02-25 DIAGNOSIS — I8312 Varicose veins of left lower extremity with inflammation: Secondary | ICD-10-CM

## 2015-02-25 NOTE — Progress Notes (Signed)
This is a 74 year old female here today for a unna boot dressing change.

## 2015-02-27 ENCOUNTER — Ambulatory Visit (INDEPENDENT_AMBULATORY_CARE_PROVIDER_SITE_OTHER): Payer: Medicare Other | Admitting: Nurse Practitioner

## 2015-02-27 ENCOUNTER — Encounter: Payer: Self-pay | Admitting: Nurse Practitioner

## 2015-02-27 VITALS — BP 138/82 | HR 95 | Temp 97.2°F | Resp 14 | Ht 65.0 in | Wt 224.0 lb

## 2015-02-27 DIAGNOSIS — I10 Essential (primary) hypertension: Secondary | ICD-10-CM

## 2015-02-27 NOTE — Assessment & Plan Note (Signed)
BP improved on HCTZ 12.5 mg. Will continue this regimen.   BP Readings from Last 3 Encounters:  02/27/15 138/82  02/20/15 155/82  02/18/15 160/92

## 2015-02-27 NOTE — Progress Notes (Signed)
   Subjective:    Patient ID: Ann Spencer, female    DOB: Dec 15, 1940, 74 y.o.   MRN: [id number]  HPI  Ann Spencer is a 74 yo female here for a 1 week follow up of BP.   1) She is currently controlled on HCTZ 12.5 mg and is undergoing surgery next week for shoulder. 03/04/15 is the date for surgery.    BP Readings from Last 3 Encounters:  02/27/15 138/82  02/20/15 155/82  02/18/15 160/92   Review of Systems  Constitutional: Negative for fever, chills, diaphoresis and fatigue.  Respiratory: Negative for chest tightness, shortness of breath and wheezing.   Cardiovascular: Negative for chest pain, palpitations and leg swelling.  Gastrointestinal: Negative for nausea, vomiting and diarrhea.  Skin: Negative for rash.  Neurological: Negative for dizziness, weakness, numbness and headaches.  Psychiatric/Behavioral: The patient is not nervous/anxious.        Objective:   Physical Exam  Constitutional: She is oriented to person, place, and time. She appears well-developed and well-nourished. No distress.  HENT:  Head: Normocephalic and atraumatic.  Right Ear: External ear normal.  Left Ear: External ear normal.  Cardiovascular: Normal rate, regular rhythm and normal heart sounds.  Exam reveals no gallop and no friction rub.   No murmur heard. Pulmonary/Chest: Effort normal and breath sounds normal. No respiratory distress. She has no wheezes. She has no rales. She exhibits no tenderness.  Neurological: She is alert and oriented to person, place, and time. No cranial nerve deficit. She exhibits normal muscle tone. Coordination normal.  Skin: Skin is warm and dry. No rash noted. She is not diaphoretic.  Psychiatric: She has a normal mood and affect. Her behavior is normal. Judgment and thought content normal.      Assessment & Plan:

## 2015-02-27 NOTE — Progress Notes (Signed)
Pre visit review using our clinic review tool, if applicable. No additional management support is needed unless otherwise documented below in the visit note. 

## 2015-03-03 ENCOUNTER — Ambulatory Visit (INDEPENDENT_AMBULATORY_CARE_PROVIDER_SITE_OTHER): Payer: Medicare Other | Admitting: *Deleted

## 2015-03-03 DIAGNOSIS — I8312 Varicose veins of left lower extremity with inflammation: Secondary | ICD-10-CM

## 2015-03-03 HISTORY — PX: SHOULDER SURGERY: SHX246

## 2015-03-03 NOTE — Patient Instructions (Signed)
As scheduled

## 2015-03-04 ENCOUNTER — Observation Stay: Payer: Self-pay | Admitting: Orthopedic Surgery

## 2015-03-04 DIAGNOSIS — I10 Essential (primary) hypertension: Secondary | ICD-10-CM | POA: Diagnosis not present

## 2015-03-04 DIAGNOSIS — M75122 Complete rotator cuff tear or rupture of left shoulder, not specified as traumatic: Secondary | ICD-10-CM | POA: Diagnosis not present

## 2015-03-04 DIAGNOSIS — M7522 Bicipital tendinitis, left shoulder: Secondary | ICD-10-CM | POA: Diagnosis not present

## 2015-03-04 DIAGNOSIS — M25512 Pain in left shoulder: Secondary | ICD-10-CM | POA: Diagnosis not present

## 2015-03-04 DIAGNOSIS — M19012 Primary osteoarthritis, left shoulder: Secondary | ICD-10-CM | POA: Diagnosis not present

## 2015-03-04 DIAGNOSIS — G8918 Other acute postprocedural pain: Secondary | ICD-10-CM | POA: Diagnosis not present

## 2015-03-04 DIAGNOSIS — M5031 Other cervical disc degeneration,  high cervical region: Secondary | ICD-10-CM | POA: Diagnosis not present

## 2015-03-04 DIAGNOSIS — S46212A Strain of muscle, fascia and tendon of other parts of biceps, left arm, initial encounter: Secondary | ICD-10-CM | POA: Diagnosis not present

## 2015-03-04 DIAGNOSIS — S43422A Sprain of left rotator cuff capsule, initial encounter: Secondary | ICD-10-CM | POA: Diagnosis not present

## 2015-03-04 DIAGNOSIS — M7542 Impingement syndrome of left shoulder: Secondary | ICD-10-CM | POA: Diagnosis not present

## 2015-03-04 DIAGNOSIS — Z79899 Other long term (current) drug therapy: Secondary | ICD-10-CM | POA: Diagnosis not present

## 2015-03-04 DIAGNOSIS — Z880 Allergy status to penicillin: Secondary | ICD-10-CM | POA: Diagnosis not present

## 2015-03-05 DIAGNOSIS — M7542 Impingement syndrome of left shoulder: Secondary | ICD-10-CM | POA: Diagnosis not present

## 2015-03-05 DIAGNOSIS — M75122 Complete rotator cuff tear or rupture of left shoulder, not specified as traumatic: Secondary | ICD-10-CM | POA: Diagnosis not present

## 2015-03-05 DIAGNOSIS — M7522 Bicipital tendinitis, left shoulder: Secondary | ICD-10-CM | POA: Diagnosis not present

## 2015-03-05 DIAGNOSIS — M5031 Other cervical disc degeneration,  high cervical region: Secondary | ICD-10-CM | POA: Diagnosis not present

## 2015-03-05 DIAGNOSIS — I10 Essential (primary) hypertension: Secondary | ICD-10-CM | POA: Diagnosis not present

## 2015-03-05 DIAGNOSIS — M19012 Primary osteoarthritis, left shoulder: Secondary | ICD-10-CM | POA: Diagnosis not present

## 2015-03-05 LAB — CREATININE, SERUM
CREATININE: 1 mg/dL
EGFR (African American): >60
EGFR (Non-African Amer.): 56 — ABNORMAL LOW

## 2015-03-06 DIAGNOSIS — Z4889 Encounter for other specified surgical aftercare: Secondary | ICD-10-CM | POA: Diagnosis not present

## 2015-03-06 DIAGNOSIS — I10 Essential (primary) hypertension: Secondary | ICD-10-CM | POA: Diagnosis not present

## 2015-03-06 DIAGNOSIS — M6281 Muscle weakness (generalized): Secondary | ICD-10-CM | POA: Diagnosis not present

## 2015-03-06 DIAGNOSIS — M810 Age-related osteoporosis without current pathological fracture: Secondary | ICD-10-CM | POA: Diagnosis not present

## 2015-03-12 ENCOUNTER — Encounter: Payer: Self-pay | Admitting: General Surgery

## 2015-03-12 ENCOUNTER — Ambulatory Visit (INDEPENDENT_AMBULATORY_CARE_PROVIDER_SITE_OTHER): Payer: Medicare Other | Admitting: General Surgery

## 2015-03-12 VITALS — BP 140/70 | HR 74 | Resp 14 | Ht 66.0 in | Wt 220.0 lb

## 2015-03-12 DIAGNOSIS — I8312 Varicose veins of left lower extremity with inflammation: Secondary | ICD-10-CM

## 2015-03-12 NOTE — Progress Notes (Signed)
This is a 74 year old female following up with dermatitis.  Left leg discoloration and edema in lower third is markedly decreased. No edema noted. Pulses are intact. Marked improvement of stasis changes in left leg. Resume use of compression hose.  4yr f/u

## 2015-03-13 DIAGNOSIS — Z4889 Encounter for other specified surgical aftercare: Secondary | ICD-10-CM | POA: Diagnosis not present

## 2015-03-13 DIAGNOSIS — I10 Essential (primary) hypertension: Secondary | ICD-10-CM | POA: Diagnosis not present

## 2015-03-13 DIAGNOSIS — M810 Age-related osteoporosis without current pathological fracture: Secondary | ICD-10-CM | POA: Diagnosis not present

## 2015-03-13 DIAGNOSIS — M6281 Muscle weakness (generalized): Secondary | ICD-10-CM | POA: Diagnosis not present

## 2015-03-17 DIAGNOSIS — I10 Essential (primary) hypertension: Secondary | ICD-10-CM | POA: Diagnosis not present

## 2015-03-17 DIAGNOSIS — Z4889 Encounter for other specified surgical aftercare: Secondary | ICD-10-CM | POA: Diagnosis not present

## 2015-03-17 DIAGNOSIS — M810 Age-related osteoporosis without current pathological fracture: Secondary | ICD-10-CM | POA: Diagnosis not present

## 2015-03-17 DIAGNOSIS — M6281 Muscle weakness (generalized): Secondary | ICD-10-CM | POA: Diagnosis not present

## 2015-03-19 DIAGNOSIS — M6281 Muscle weakness (generalized): Secondary | ICD-10-CM | POA: Diagnosis not present

## 2015-03-19 DIAGNOSIS — I10 Essential (primary) hypertension: Secondary | ICD-10-CM | POA: Diagnosis not present

## 2015-03-19 DIAGNOSIS — M810 Age-related osteoporosis without current pathological fracture: Secondary | ICD-10-CM | POA: Diagnosis not present

## 2015-03-19 DIAGNOSIS — Z4889 Encounter for other specified surgical aftercare: Secondary | ICD-10-CM | POA: Diagnosis not present

## 2015-03-24 DIAGNOSIS — M25512 Pain in left shoulder: Secondary | ICD-10-CM | POA: Diagnosis not present

## 2015-03-24 DIAGNOSIS — M25612 Stiffness of left shoulder, not elsewhere classified: Secondary | ICD-10-CM | POA: Diagnosis not present

## 2015-03-27 DIAGNOSIS — M25612 Stiffness of left shoulder, not elsewhere classified: Secondary | ICD-10-CM | POA: Diagnosis not present

## 2015-03-27 DIAGNOSIS — M25512 Pain in left shoulder: Secondary | ICD-10-CM | POA: Diagnosis not present

## 2015-03-30 DIAGNOSIS — M5412 Radiculopathy, cervical region: Secondary | ICD-10-CM | POA: Diagnosis not present

## 2015-03-30 DIAGNOSIS — M542 Cervicalgia: Secondary | ICD-10-CM | POA: Diagnosis not present

## 2015-03-30 DIAGNOSIS — M47812 Spondylosis without myelopathy or radiculopathy, cervical region: Secondary | ICD-10-CM | POA: Diagnosis not present

## 2015-03-31 DIAGNOSIS — M25512 Pain in left shoulder: Secondary | ICD-10-CM | POA: Diagnosis not present

## 2015-03-31 DIAGNOSIS — M25612 Stiffness of left shoulder, not elsewhere classified: Secondary | ICD-10-CM | POA: Diagnosis not present

## 2015-04-02 DIAGNOSIS — M25612 Stiffness of left shoulder, not elsewhere classified: Secondary | ICD-10-CM | POA: Diagnosis not present

## 2015-04-02 DIAGNOSIS — M25512 Pain in left shoulder: Secondary | ICD-10-CM | POA: Diagnosis not present

## 2015-04-07 DIAGNOSIS — M25512 Pain in left shoulder: Secondary | ICD-10-CM | POA: Diagnosis not present

## 2015-04-07 DIAGNOSIS — M25612 Stiffness of left shoulder, not elsewhere classified: Secondary | ICD-10-CM | POA: Diagnosis not present

## 2015-04-09 DIAGNOSIS — M25512 Pain in left shoulder: Secondary | ICD-10-CM | POA: Diagnosis not present

## 2015-04-09 DIAGNOSIS — M25612 Stiffness of left shoulder, not elsewhere classified: Secondary | ICD-10-CM | POA: Diagnosis not present

## 2015-04-12 NOTE — Op Note (Signed)
PATIENT NAME:  Ann Spencer, Ann Spencer MR#:  [id number] DATE OF BIRTH:  07/12/1941  DATE OF PROCEDURE:  03/04/2015   PREOPERATIVE DIAGNOSIS: Left full-thickness rotator cuff tear with shoulder impingement and acromioclavicular joint arthrosis.   POSTOPERATIVE DIAGNOSIS: Left shoulder full-thickness rotator cuff tear involving the supra and infraspinatus, partial tear of the biceps tendon with advance biceps tendinitis, subacromial impingement, and acromioclavicular arthrosis.   PROCEDURE: Left shoulder arthroscopic subacromial decompression, distal clavicle excision, and mini open rotator cuff repair and biceps tenodesis.   ANESTHESIA: General with left interscalene block.   SURGEON: Thornton Park, MD,     ESTIMATED BLOOD LOSS: Minimal.   COMPLICATIONS: None.   IMPLANTS: ArthroCare Magnum 2 anchors x 6 and Magnum M anchors x 2.   INDICATIONS FOR PROCEDURE:  Ann Spencer is a 74 year old left-hand dominant female who has had tremendous left shoulder pain with significant limitation of motion. The patient had an MRI of the left shoulder performed on 01/27/2015 at [facility].  This showed a full-thickness tear involving the supraspinatus with retraction.  The patient wants to proceed with rotator cuff repair given her pain and disability.  I reviewed the risks and benefits of the procedure with the patient and her daughter in my office prior to the date of surgery.  I explained to them the details of surgery and the postoperative course. They understand that the risks include, but are not limited to, infection, bleeding, nerve or blood vessel injury, shoulder stiffness, persistent pain or limitation of motion of the left shoulder, hardware failure, re-tear of the tendon and the need for further surgery.  Medical risks include, but are not limited to DVT and pulmonary embolism, myocardial infarction, stroke, pneumonia, respiratory failure and death. The patient has been given medical clearance from her primary  care office.  I have reviewed all preoperative labs and radiographic studies in preparation for this case.   PROCEDURE NOTE: The patient was met in the preoperative area. Her daughters and granddaughters were at the bedside.  I answered all of their questions. I signed the patient's left shoulder with the word "yes" and my initials according to the hospital's correct site of surgery protocol.  I verbally confirmed with the patient that this was the correct site of surgery, as well as confirmed it with my office notes and the radiographic studies.  The patient was brought to the Operating Room and underwent a left interscalene block.  She then underwent general endotracheal intubation. She was positioned in a beach chair position. All bony prominences were padded including the lower extremities. A spider arm positioner was used for this case. A timeout was performed to verify the patient's name, date of birth, medical record number, correct site of surgery and correct procedure to be performed.  It was also used to verify the patient had received antibiotics and that all appropriate instruments, implants, and radiographic studies were available in the room. Once all in attendance were in agreement, the case began.   The patient had an examination under anesthesia. She had full passive range of motion without instability to load and shift testing, both anteriorly and posteriorly. She had a negative sulcus sign.   The patient had her bony landmarks drawn out with a surgical marker along with the proposed arthroscopy incisions.  These were pre-injected with 1% lidocaine plain.  An 11 blade was used to establish the posterior portal through which the arthroscope was placed into the glenohumeral joint. An anterior portal was then established using an 18-gauge spinal  needle for localization. A 5.5 mm arthroscopic cannula was placed through the anterior portal and a full diagnostic examination of the shoulder was  undertaken.   Findings on arthroscopy included extensive fraying of the labrum. The patient had severe biceps tendinosis with partial-thickness tear. There is extensive tendinosis of the subscapularis with a longitudinal split tear within it, but no full-thickness tear causing retraction that I could see. The patient also had a full-thickness tear with retraction of the supraspinatus extending into the infraspinatus. The glenohumeral joint surfaces had mild chondromalacia but no full-thickness chondral loss.  The inferior recess had no HAGL lesion or loose bodies.   A 4-0 resector shaver blade was placed through the anterior portal. The frayed edges of the labrum were debrided.  The lateral portal was then established again using an 18-gauge spinal needle for localization. This allowed for placement of a Smart stitch within the biceps tendon. An arthroscopic scissor was then placed into the anterior portal and a biceps tenotomy was performed.  The biceps was sutured to allow for identification for later biceps tenodesis. The 4-0 resector shaver blade was used to debride the torn edges of the rotator cuff from the articular side. The arthroscope was withdrawn from the glenohumeral joint and placed in the subacromial space. Extensive bursitis was encountered. Through the lateral portal, a 4-0 resector shaver blade and 90 degree ArthroCare wand were used to perform extensive bursectomy.  A 5.5 mm resector shaver blade was then used to perform a subacromial decompression from the lateral portal.   The 5.5 mm resector shaver blade was then placed through the anterior portal and a distal clavicle excision was performed. Arthroscopic images of these of the subacromial decompression and distal clavicle excision were taken.   Three smart stitches were then placed within the lateral border of the rotator cuff.  Images of the rotator cuff were taken from the posterior and lateral portals to understand its morphology.  This was a large crescentic tear.  The subacromial space was then copiously irrigated and lavaged and all arthroscopic instruments were then removed.   A saber-type incision was made along the lateral border of the acromion. The deltoid muscle was then identified and split in line with its fibers. The Smart stitches which had been placed into the biceps and the lateral border of the rotator cuff were then brought out through this deltoid split.   A second Smart stitch was placed lower down in the biceps tendon. The intra-articular portion of the biceps, which was frayed and included advanced tendinosis was then resected using a #15 blade.  A biceps tenodesis was then performed through the mini open incision. The biceps was reattached at the top of intertuberous groove using a Magnum 2 anchor.  A 5.5 mm resector shaver blade was then used to debride the greater tuberosity of any remaining torn fibers of the rotator cuff.  The crescentic tear involved not only the supraspinatus, but extended into the infraspinatus. The 2 Magnum M anchors were placed at the articular margin of the humeral head. The 4 suture limbs from each of these Magnum M anchors were then passed medially through the rotator cuff using a first-pass suture passer.  These sutures were then clamped with a hemostat for later medial row repair.  A fourth Smart stitch was then placed into the lateral border of the rotator cuff.  The lateral row required 4 Magnum 2 anchors for adequate fixation. One Magnum 2 anchor misfired during placement, so a total of  5 anchors were placed lateral to the greater tuberosity but only 4 were required for fixation.  The 2 central Smart stitches were placed laterally and the posterior-most Smart stitch was brought across and fixed anteriorly. The anterior most was crisscrossed with this 1 to be anchored posteriorly. This allowed for the best approximation of the rotator cuff on the greater tuberosity footprint without  any dog ears.  Once the lateral aspect of the rotator cuff was well fixed, the medial row fixation was then performed using an arthroscopic knot-tying technique, using sutures from the 2 Magnum M anchors. Once all sutures were in place, final arthroscopic images of the rotator cuff repair were taken through the deltoid split incision as well as from the glenohumeral joint arthroscopically.  The repair had excellent approximation to the greater tuberosity.  The biceps tenodesis was well fixated and arthroscopic images of this was also performed.  The wound was copiously irrigated.  All instruments were removed including the self-retaining shoulder retractor which had been used for visualization of the rotator cuff through the mini open incision.  The deltoid fascia was closed with an interrupted 0 Vicryl suture.  The subcutaneous tissue of all incisions including the arthroscopic portals was closed with 2-0 Vicryl.  The skin of the arthroscopic portals were then closed with 4-0 nylon and the saber incision was closed with a running 4-0 undyed Monocryl. Steri-Strips were applied along with Xeroform.  The patient had 0.25% Marcaine plain infused into the subacromial space and the glenohumeral joint.  A dry sterile dressing was applied along with TENS unit leads and a Polar Care sleeve and an abduction sling. The patient was then awoken and brought to the PACU in stable condition.  She is being admitted postoperatively overnight for pain control.  I spoke with her daughter in the postoperative consultation room to let her know the case had gone without complication.  The patient was stable in the recovery room.      ____________________________ Timoteo Gaul, MD klk:DT D: 03/05/2015 11:09:43 ET T: 03/05/2015 11:51:50 ET JOB#: [id number]  cc: Timoteo Gaul, MD, <Dictator> Timoteo Gaul MD ELECTRONICALLY SIGNED 03/09/2015 11:48

## 2015-04-13 DIAGNOSIS — M47812 Spondylosis without myelopathy or radiculopathy, cervical region: Secondary | ICD-10-CM | POA: Diagnosis not present

## 2015-04-13 DIAGNOSIS — M542 Cervicalgia: Secondary | ICD-10-CM | POA: Diagnosis not present

## 2015-04-13 DIAGNOSIS — Z79899 Other long term (current) drug therapy: Secondary | ICD-10-CM | POA: Diagnosis not present

## 2015-04-13 DIAGNOSIS — M545 Low back pain: Secondary | ICD-10-CM | POA: Diagnosis not present

## 2015-04-13 DIAGNOSIS — G8929 Other chronic pain: Secondary | ICD-10-CM | POA: Diagnosis not present

## 2015-04-13 DIAGNOSIS — M25511 Pain in right shoulder: Secondary | ICD-10-CM | POA: Diagnosis not present

## 2015-04-14 DIAGNOSIS — M25512 Pain in left shoulder: Secondary | ICD-10-CM | POA: Diagnosis not present

## 2015-04-14 DIAGNOSIS — M25612 Stiffness of left shoulder, not elsewhere classified: Secondary | ICD-10-CM | POA: Diagnosis not present

## 2015-04-16 DIAGNOSIS — M25612 Stiffness of left shoulder, not elsewhere classified: Secondary | ICD-10-CM | POA: Diagnosis not present

## 2015-04-16 DIAGNOSIS — M25512 Pain in left shoulder: Secondary | ICD-10-CM | POA: Diagnosis not present

## 2015-04-21 DIAGNOSIS — M25612 Stiffness of left shoulder, not elsewhere classified: Secondary | ICD-10-CM | POA: Diagnosis not present

## 2015-04-21 DIAGNOSIS — M25512 Pain in left shoulder: Secondary | ICD-10-CM | POA: Diagnosis not present

## 2015-04-23 DIAGNOSIS — M25612 Stiffness of left shoulder, not elsewhere classified: Secondary | ICD-10-CM | POA: Diagnosis not present

## 2015-04-23 DIAGNOSIS — M25512 Pain in left shoulder: Secondary | ICD-10-CM | POA: Diagnosis not present

## 2015-04-24 DIAGNOSIS — D1809 Hemangioma of other sites: Secondary | ICD-10-CM | POA: Diagnosis not present

## 2015-04-24 DIAGNOSIS — M545 Low back pain: Secondary | ICD-10-CM | POA: Diagnosis not present

## 2015-04-24 DIAGNOSIS — M5127 Other intervertebral disc displacement, lumbosacral region: Secondary | ICD-10-CM | POA: Diagnosis not present

## 2015-04-24 DIAGNOSIS — M47816 Spondylosis without myelopathy or radiculopathy, lumbar region: Secondary | ICD-10-CM | POA: Diagnosis not present

## 2015-04-24 DIAGNOSIS — M47897 Other spondylosis, lumbosacral region: Secondary | ICD-10-CM | POA: Diagnosis not present

## 2015-04-24 DIAGNOSIS — N281 Cyst of kidney, acquired: Secondary | ICD-10-CM | POA: Diagnosis not present

## 2015-04-24 DIAGNOSIS — M5126 Other intervertebral disc displacement, lumbar region: Secondary | ICD-10-CM | POA: Diagnosis not present

## 2015-04-24 DIAGNOSIS — M4696 Unspecified inflammatory spondylopathy, lumbar region: Secondary | ICD-10-CM | POA: Diagnosis not present

## 2015-05-05 DIAGNOSIS — M25512 Pain in left shoulder: Secondary | ICD-10-CM | POA: Diagnosis not present

## 2015-05-05 DIAGNOSIS — M25612 Stiffness of left shoulder, not elsewhere classified: Secondary | ICD-10-CM | POA: Diagnosis not present

## 2015-05-07 DIAGNOSIS — M25512 Pain in left shoulder: Secondary | ICD-10-CM | POA: Diagnosis not present

## 2015-05-07 DIAGNOSIS — M25612 Stiffness of left shoulder, not elsewhere classified: Secondary | ICD-10-CM | POA: Diagnosis not present

## 2015-05-12 DIAGNOSIS — M25562 Pain in left knee: Secondary | ICD-10-CM | POA: Diagnosis not present

## 2015-05-12 DIAGNOSIS — M179 Osteoarthritis of knee, unspecified: Secondary | ICD-10-CM | POA: Diagnosis not present

## 2015-05-12 DIAGNOSIS — M25569 Pain in unspecified knee: Secondary | ICD-10-CM | POA: Diagnosis not present

## 2015-05-12 DIAGNOSIS — R2991 Unspecified symptoms and signs involving the musculoskeletal system: Secondary | ICD-10-CM | POA: Diagnosis not present

## 2015-05-12 DIAGNOSIS — M545 Low back pain: Secondary | ICD-10-CM | POA: Diagnosis not present

## 2015-05-12 DIAGNOSIS — M129 Arthropathy, unspecified: Secondary | ICD-10-CM | POA: Diagnosis not present

## 2015-05-14 DIAGNOSIS — M25512 Pain in left shoulder: Secondary | ICD-10-CM | POA: Diagnosis not present

## 2015-05-14 DIAGNOSIS — M25612 Stiffness of left shoulder, not elsewhere classified: Secondary | ICD-10-CM | POA: Diagnosis not present

## 2015-05-29 DIAGNOSIS — M25612 Stiffness of left shoulder, not elsewhere classified: Secondary | ICD-10-CM | POA: Diagnosis not present

## 2015-05-29 DIAGNOSIS — M25812 Other specified joint disorders, left shoulder: Secondary | ICD-10-CM | POA: Diagnosis not present

## 2015-06-04 DIAGNOSIS — M25512 Pain in left shoulder: Secondary | ICD-10-CM | POA: Diagnosis not present

## 2015-06-04 DIAGNOSIS — M25612 Stiffness of left shoulder, not elsewhere classified: Secondary | ICD-10-CM | POA: Diagnosis not present

## 2015-06-08 DIAGNOSIS — M25612 Stiffness of left shoulder, not elsewhere classified: Secondary | ICD-10-CM | POA: Diagnosis not present

## 2015-06-08 DIAGNOSIS — M25512 Pain in left shoulder: Secondary | ICD-10-CM | POA: Diagnosis not present

## 2015-06-12 DIAGNOSIS — M25512 Pain in left shoulder: Secondary | ICD-10-CM | POA: Diagnosis not present

## 2015-06-12 DIAGNOSIS — M25612 Stiffness of left shoulder, not elsewhere classified: Secondary | ICD-10-CM | POA: Diagnosis not present

## 2015-06-14 ENCOUNTER — Other Ambulatory Visit: Payer: Self-pay | Admitting: Nurse Practitioner

## 2015-06-16 DIAGNOSIS — M25512 Pain in left shoulder: Secondary | ICD-10-CM | POA: Diagnosis not present

## 2015-06-16 DIAGNOSIS — M25612 Stiffness of left shoulder, not elsewhere classified: Secondary | ICD-10-CM | POA: Diagnosis not present

## 2015-06-19 DIAGNOSIS — M25612 Stiffness of left shoulder, not elsewhere classified: Secondary | ICD-10-CM | POA: Diagnosis not present

## 2015-06-19 DIAGNOSIS — M25512 Pain in left shoulder: Secondary | ICD-10-CM | POA: Diagnosis not present

## 2015-06-24 DIAGNOSIS — M25512 Pain in left shoulder: Secondary | ICD-10-CM | POA: Diagnosis not present

## 2015-06-24 DIAGNOSIS — M25612 Stiffness of left shoulder, not elsewhere classified: Secondary | ICD-10-CM | POA: Diagnosis not present

## 2015-06-26 DIAGNOSIS — M25512 Pain in left shoulder: Secondary | ICD-10-CM | POA: Diagnosis not present

## 2015-06-26 DIAGNOSIS — M25612 Stiffness of left shoulder, not elsewhere classified: Secondary | ICD-10-CM | POA: Diagnosis not present

## 2015-07-06 DIAGNOSIS — M2390 Unspecified internal derangement of unspecified knee: Secondary | ICD-10-CM | POA: Diagnosis not present

## 2015-07-06 DIAGNOSIS — M1990 Unspecified osteoarthritis, unspecified site: Secondary | ICD-10-CM | POA: Diagnosis not present

## 2015-07-06 DIAGNOSIS — M25562 Pain in left knee: Secondary | ICD-10-CM | POA: Diagnosis not present

## 2015-07-06 DIAGNOSIS — S0990XA Unspecified injury of head, initial encounter: Secondary | ICD-10-CM | POA: Diagnosis not present

## 2015-07-06 DIAGNOSIS — M542 Cervicalgia: Secondary | ICD-10-CM | POA: Diagnosis not present

## 2015-07-06 DIAGNOSIS — M545 Low back pain: Secondary | ICD-10-CM | POA: Diagnosis not present

## 2015-07-06 DIAGNOSIS — M47812 Spondylosis without myelopathy or radiculopathy, cervical region: Secondary | ICD-10-CM | POA: Diagnosis not present

## 2015-07-06 DIAGNOSIS — M179 Osteoarthritis of knee, unspecified: Secondary | ICD-10-CM | POA: Diagnosis not present

## 2015-07-06 DIAGNOSIS — G8929 Other chronic pain: Secondary | ICD-10-CM | POA: Diagnosis not present

## 2015-07-06 DIAGNOSIS — M129 Arthropathy, unspecified: Secondary | ICD-10-CM | POA: Diagnosis not present

## 2015-07-06 DIAGNOSIS — M25569 Pain in unspecified knee: Secondary | ICD-10-CM | POA: Diagnosis not present

## 2015-07-06 DIAGNOSIS — Z79899 Other long term (current) drug therapy: Secondary | ICD-10-CM | POA: Diagnosis not present

## 2015-07-08 DIAGNOSIS — M25612 Stiffness of left shoulder, not elsewhere classified: Secondary | ICD-10-CM | POA: Diagnosis not present

## 2015-07-08 DIAGNOSIS — M25512 Pain in left shoulder: Secondary | ICD-10-CM | POA: Diagnosis not present

## 2015-07-10 DIAGNOSIS — M25612 Stiffness of left shoulder, not elsewhere classified: Secondary | ICD-10-CM | POA: Diagnosis not present

## 2015-07-10 DIAGNOSIS — M25512 Pain in left shoulder: Secondary | ICD-10-CM | POA: Diagnosis not present

## 2015-07-22 ENCOUNTER — Encounter: Payer: Self-pay | Admitting: Family Medicine

## 2015-07-22 ENCOUNTER — Ambulatory Visit (INDEPENDENT_AMBULATORY_CARE_PROVIDER_SITE_OTHER): Payer: Medicare Other | Admitting: Family Medicine

## 2015-07-22 VITALS — BP 116/76 | HR 76 | Temp 97.7°F | Ht 66.0 in

## 2015-07-22 DIAGNOSIS — B349 Viral infection, unspecified: Secondary | ICD-10-CM

## 2015-07-22 DIAGNOSIS — R05 Cough: Secondary | ICD-10-CM | POA: Diagnosis not present

## 2015-07-22 DIAGNOSIS — B9789 Other viral agents as the cause of diseases classified elsewhere: Secondary | ICD-10-CM

## 2015-07-22 DIAGNOSIS — R059 Cough, unspecified: Secondary | ICD-10-CM | POA: Insufficient documentation

## 2015-07-22 DIAGNOSIS — J988 Other specified respiratory disorders: Secondary | ICD-10-CM

## 2015-07-22 MED ORDER — HYDROCODONE-HOMATROPINE 5-1.5 MG/5ML PO SYRP: 5.0000 mL | ORAL_SOLUTION | Freq: Three times a day (TID) | ORAL | Status: DC | PRN

## 2015-07-22 NOTE — Progress Notes (Signed)
   Subjective:  Patient ID: Ann Spencer, female    DOB: [birth year]  Age: 74 y.o. MRN: [id number]  CC: Congestion/Sore throat  HPI  74 year old female with a past medical history of hypertension presents to clinic today with complaints of congestion and sore throat.  Patient reports that she has had a cough and congestion for the past 2-3 days. She states the cough is productive. She states that her symptoms appear to be worsening. No exacerbating factors. She has had some relief with over-the-counter Mucinex. No associated fevers, chills, shortness of breath. Patient does report that she has had recent sick contact in her 52-year-old great granddaughter. She states she was recently diagnosed with a respiratory infection and treated with amoxicillin.  Social Hx - Former smoker.   Review of Systems  Constitutional: Negative for fever and appetite change.  HENT: Positive for congestion and sore throat.   Respiratory: Positive for cough. Negative for shortness of breath.    Objective:  Ht 5\' 6"  (1.676 m)  BP/Weight 03/12/2015 02/27/2015 08/06/4157  Systolic BP 309 407 680  Diastolic BP 70 82 82  Wt. (Lbs) 220 224 223.84  BMI 35.53 37.28 37.25   Physical Exam  Constitutional: She appears well-developed and well-nourished. No distress.  HENT:  Head: Normocephalic and atraumatic.  Right Ear: External ear normal.  Left Ear: External ear normal.  Mouth/Throat: No oropharyngeal exudate.  Normal TMs bilaterally. Oropharynx mildly erythematous.  Neck: Neck supple.  Cardiovascular: Normal rate and regular rhythm.   No murmur heard. Pulmonary/Chest: Effort normal and breath sounds normal. No respiratory distress. She has no wheezes. She has no rales.  Abdominal: Soft. She exhibits no distension. There is no tenderness. There is no rebound and no guarding.  Musculoskeletal: She exhibits no edema.  Lymphadenopathy:    She has no cervical adenopathy.  Vitals reviewed.   Lab Results    Component Value Date   WBC 4.0 02/20/2015   HGB 15.1* 02/20/2015   HCT 44.2 02/20/2015   PLT 191.0 02/20/2015   GLUCOSE 102* 02/20/2015   ALT 26 02/20/2015   AST 19 02/20/2015   NA 136 02/20/2015   K 4.5 02/20/2015   CL 100 02/20/2015   CREATININE 1.00 03/05/2015   BUN 17 02/20/2015   CO2 32 02/20/2015    Assessment & Plan:   Problem List Items Addressed This Visit    None      No orders of the defined types were placed in this encounter.     Follow-up: No Follow-up on file.    Thersa Salt, DO

## 2015-07-22 NOTE — Assessment & Plan Note (Signed)
New problem. History consistent with viral illness. Physical exam unremarkable today. Advised supportive care with increased fluid intake and rest. Mucinex as needed. Patient given prescription for Hycodan for cough. Patient to call she worsens or fails to improve.

## 2015-07-22 NOTE — Progress Notes (Signed)
Pre visit review using our clinic review tool, if applicable. No additional management support is needed unless otherwise documented below in the visit note. 

## 2015-07-22 NOTE — Patient Instructions (Signed)
Use the cough syrup as indicated.  Call if you worsen or fail to improve over the next several days.  Take care  Dr. Lacinda Axon   Viral Infections A viral infection can be caused by different types of viruses.Most viral infections are not serious and resolve on their own. However, some infections may cause severe symptoms and may lead to further complications. SYMPTOMS Viruses can frequently cause:  Minor sore throat.  Aches and pains.  Headaches.  Runny nose.  Different types of rashes.  Watery eyes.  Tiredness.  Cough.  Loss of appetite.  Gastrointestinal infections, resulting in nausea, vomiting, and diarrhea. These symptoms do not respond to antibiotics because the infection is not caused by bacteria. However, you might catch a bacterial infection following the viral infection. This is sometimes called a "superinfection." Symptoms of such a bacterial infection may include:  Worsening sore throat with pus and difficulty swallowing.  Swollen neck glands.  Chills and a high or persistent fever.  Severe headache.  Tenderness over the sinuses.  Persistent overall ill feeling (malaise), muscle aches, and tiredness (fatigue).  Persistent cough.  Yellow, green, or brown mucus production with coughing. HOME CARE INSTRUCTIONS   Only take over-the-counter or prescription medicines for pain, discomfort, diarrhea, or fever as directed by your caregiver.  Drink enough water and fluids to keep your urine clear or pale yellow. Sports drinks can provide valuable electrolytes, sugars, and hydration.  Get plenty of rest and maintain proper nutrition. Soups and broths with crackers or rice are fine. SEEK IMMEDIATE MEDICAL CARE IF:   You have severe headaches, shortness of breath, chest pain, neck pain, or an unusual rash.  You have uncontrolled vomiting, diarrhea, or you are unable to keep down fluids.  You or your child has an oral temperature above 102 F (38.9 C), not  controlled by medicine.  Your baby is older than 3 months with a rectal temperature of 102 F (38.9 C) or higher.  Your baby is 56 months old or younger with a rectal temperature of 100.4 F (38 C) or higher. MAKE SURE YOU:   Understand these instructions.  Will watch your condition.  Will get help right away if you are not doing well or get worse. Document Released: 09/07/2005 Document Revised: 02/20/2012 Document Reviewed: 04/04/2011 [facility] Patient Information 2015 [place], [place]. This information is not intended to replace advice given to you by your health care provider. Make sure you discuss any questions you have with your health care provider.

## 2015-07-30 DIAGNOSIS — M179 Osteoarthritis of knee, unspecified: Secondary | ICD-10-CM | POA: Diagnosis not present

## 2015-07-30 DIAGNOSIS — M545 Low back pain: Secondary | ICD-10-CM | POA: Diagnosis not present

## 2015-07-30 DIAGNOSIS — M542 Cervicalgia: Secondary | ICD-10-CM | POA: Diagnosis not present

## 2015-07-30 DIAGNOSIS — G8929 Other chronic pain: Secondary | ICD-10-CM | POA: Diagnosis not present

## 2015-07-30 DIAGNOSIS — S0990XA Unspecified injury of head, initial encounter: Secondary | ICD-10-CM | POA: Diagnosis not present

## 2015-07-30 DIAGNOSIS — Z79899 Other long term (current) drug therapy: Secondary | ICD-10-CM | POA: Diagnosis not present

## 2015-07-30 DIAGNOSIS — M25562 Pain in left knee: Secondary | ICD-10-CM | POA: Diagnosis not present

## 2015-07-30 DIAGNOSIS — M1990 Unspecified osteoarthritis, unspecified site: Secondary | ICD-10-CM | POA: Diagnosis not present

## 2015-07-30 DIAGNOSIS — M25569 Pain in unspecified knee: Secondary | ICD-10-CM | POA: Diagnosis not present

## 2015-07-30 DIAGNOSIS — M129 Arthropathy, unspecified: Secondary | ICD-10-CM | POA: Diagnosis not present

## 2015-07-30 DIAGNOSIS — M47812 Spondylosis without myelopathy or radiculopathy, cervical region: Secondary | ICD-10-CM | POA: Diagnosis not present

## 2015-07-30 DIAGNOSIS — M2390 Unspecified internal derangement of unspecified knee: Secondary | ICD-10-CM | POA: Diagnosis not present

## 2015-11-04 ENCOUNTER — Encounter: Payer: Self-pay | Admitting: Family Medicine

## 2015-11-04 ENCOUNTER — Ambulatory Visit (INDEPENDENT_AMBULATORY_CARE_PROVIDER_SITE_OTHER): Payer: Medicare Other | Admitting: Family Medicine

## 2015-11-04 VITALS — BP 128/88 | HR 93 | Temp 98.0°F | Ht 66.0 in | Wt 223.4 lb

## 2015-11-04 DIAGNOSIS — J069 Acute upper respiratory infection, unspecified: Secondary | ICD-10-CM | POA: Diagnosis not present

## 2015-11-04 LAB — POCT RAPID STREP A (OFFICE): Rapid Strep A Screen: NEGATIVE

## 2015-11-04 NOTE — Progress Notes (Signed)
   Subjective:  Patient ID: Ann Spencer, female    DOB: 1941/05/06  Age: 74 y.o. MRN: [id number]  CC: Sore throat, congestion, chills, headache  HPI:  74 year old female presents to clinic today with the above complaints.  Patient states that she began not feeling well and yesterday she developed sore throat. She subsequently developed chills, congestion, and headache. No known fever. Patient is concerned that she may have strep throat.  She is taking Sudafed for her symptoms with little relief. No known exacerbating factors.  Social Hx   Social History   Social History  . Marital Status: Widowed    Spouse Name: N/A  . Number of Children: N/A  . Years of Education: N/A   Social History Main Topics  . Smoking status: Former Smoker -- 30 years    Quit date: 12/12/1993  . Smokeless tobacco: Never Used  . Alcohol Use: 4.2 oz/week    7 Standard drinks or equivalent per week     Comment: Wine   . Drug Use: No  . Sexual Activity: No   Other Topics Concern  . None   Social History Narrative   [employer]   Lives with daughter and grandaughter    2 dogs lives inside   Enjoys reading   Review of Systems  Constitutional: Positive for chills. Negative for fever.  HENT: Positive for congestion and sore throat.   Neurological: Positive for headaches.   Objective:  BP 128/88 mmHg  Pulse 93  Temp(Src) 98 F (36.7 C) (Oral)  Ht 5\' 6"  (1.676 m)  Wt 223 lb 6 oz (101.322 kg)  BMI 36.07 kg/m2  SpO2 96%  BP/Weight 11/04/2015 07/22/2015 [id number]  Systolic BP 0000000 [id number]  Diastolic BP 88 76 70  Wt. (Lbs) 223.38 - 220  BMI 36.07 - 35.53   Physical Exam  Constitutional: She appears well-developed. No distress.  HENT:  Head: Normocephalic and atraumatic.  Normal TMs bilaterally. Oropharynx with moderate erythema. No exudate.  Eyes: Conjunctivae are normal.  Neck: Neck supple.  Cardiovascular: Normal rate and regular rhythm.   Pulmonary/Chest: Effort normal and  breath sounds normal. No respiratory distress. She has no wheezes. She has no rales.  Lymphadenopathy:    She has no cervical adenopathy.  Neurological: She is alert.  Vitals reviewed.  Rapid strep - negative.  Assessment & Plan:   Problem List Items Addressed This Visit    URI (upper respiratory infection) - Primary    Patient's exam unremarkable except for moderate oropharyngeal erythema. Given duration of symptoms and negative strep test this is likely viral in origin. Advised supportive care. Sending throat culture.      Relevant Orders   POCT rapid strep A (Completed)   Culture, Group A Strep     Follow-up: Return if symptoms worsen or fail to improve.  [place]

## 2015-11-04 NOTE — Assessment & Plan Note (Signed)
Patient's exam unremarkable except for moderate oropharyngeal erythema. Given duration of symptoms and negative strep test this is likely viral in origin. Advised supportive care. Sending throat culture.

## 2015-11-04 NOTE — Progress Notes (Signed)
Pre visit review using our clinic review tool, if applicable. No additional management support is needed unless otherwise documented below in the visit note. 

## 2015-11-04 NOTE — Patient Instructions (Signed)
Your strep test was negative.  This is likely viral in origin.  I advise supportive care with rest, tylenol/motrin.  Follow up if you fail to improve or worsen.  Take care  Dr. Lacinda Axon

## 2015-11-06 LAB — CULTURE, GROUP A STREP: Organism ID, Bacteria: NORMAL

## 2015-11-17 ENCOUNTER — Ambulatory Visit: Payer: Medicare Other | Attending: Pain Medicine | Admitting: Pain Medicine

## 2015-11-17 ENCOUNTER — Encounter: Payer: Self-pay | Admitting: Pain Medicine

## 2015-11-17 VITALS — BP 158/100 | HR 90 | Temp 97.8°F | Resp 18 | Ht 63.0 in | Wt 223.0 lb

## 2015-11-17 DIAGNOSIS — I868 Varicose veins of other specified sites: Secondary | ICD-10-CM | POA: Insufficient documentation

## 2015-11-17 DIAGNOSIS — Z87891 Personal history of nicotine dependence: Secondary | ICD-10-CM | POA: Diagnosis not present

## 2015-11-17 DIAGNOSIS — M1712 Unilateral primary osteoarthritis, left knee: Secondary | ICD-10-CM

## 2015-11-17 DIAGNOSIS — M2392 Unspecified internal derangement of left knee: Secondary | ICD-10-CM

## 2015-11-17 DIAGNOSIS — M545 Low back pain, unspecified: Secondary | ICD-10-CM

## 2015-11-17 DIAGNOSIS — M25562 Pain in left knee: Secondary | ICD-10-CM | POA: Insufficient documentation

## 2015-11-17 DIAGNOSIS — M810 Age-related osteoporosis without current pathological fracture: Secondary | ICD-10-CM | POA: Diagnosis not present

## 2015-11-17 DIAGNOSIS — Z87828 Personal history of other (healed) physical injury and trauma: Secondary | ICD-10-CM

## 2015-11-17 DIAGNOSIS — I10 Essential (primary) hypertension: Secondary | ICD-10-CM | POA: Diagnosis not present

## 2015-11-17 DIAGNOSIS — I70209 Unspecified atherosclerosis of native arteries of extremities, unspecified extremity: Secondary | ICD-10-CM | POA: Diagnosis not present

## 2015-11-17 DIAGNOSIS — M75102 Unspecified rotator cuff tear or rupture of left shoulder, not specified as traumatic: Secondary | ICD-10-CM | POA: Insufficient documentation

## 2015-11-17 DIAGNOSIS — M47812 Spondylosis without myelopathy or radiculopathy, cervical region: Secondary | ICD-10-CM

## 2015-11-17 DIAGNOSIS — M47816 Spondylosis without myelopathy or radiculopathy, lumbar region: Secondary | ICD-10-CM

## 2015-11-17 DIAGNOSIS — X58XXXA Exposure to other specified factors, initial encounter: Secondary | ICD-10-CM | POA: Insufficient documentation

## 2015-11-17 DIAGNOSIS — M542 Cervicalgia: Secondary | ICD-10-CM

## 2015-11-17 DIAGNOSIS — M25561 Pain in right knee: Secondary | ICD-10-CM

## 2015-11-17 DIAGNOSIS — M25569 Pain in unspecified knee: Secondary | ICD-10-CM | POA: Diagnosis present

## 2015-11-17 DIAGNOSIS — G8929 Other chronic pain: Secondary | ICD-10-CM | POA: Diagnosis not present

## 2015-11-17 DIAGNOSIS — M129 Arthropathy, unspecified: Secondary | ICD-10-CM | POA: Diagnosis not present

## 2015-11-17 HISTORY — DX: Personal history of other (healed) physical injury and trauma: Z87.828

## 2015-11-17 NOTE — Progress Notes (Signed)
Patient's Name: Ann Spencer MRN: [id number] DOB: 06/30/1941 DOS: 11/17/2015  Primary Reason(s) for Visit: Evaluation of uncontrolled established, chronic problem CC: Knee Pain   HPI:   Ann Spencer is a 74 y.o. year old, female patient, who returns today as an established patient. She has Varicose veins of both lower extremities; Encounter to establish care; Essential hypertension; Rotator cuff tear (Left); URI (upper respiratory infection); Chronic knee pain (Left); Chronic knee osteoarthritis (Left); Chronic pain; Arthritis of knee (Left); Arthropathy of knee (Left); Knee derangement (Left); Chronic low back pain; Chronic neck pain; Cervical spondylosis; Lumbar spondylosis; History of head injury; Osteoporosis; and Atherosclerotic peripheral vascular disease ([place]) on her problem list.. Her primarily concern today is the Knee Pain     The patient comes into clinic today after last time being seen on 07/30/2015. Previously she had a left intra-articular knee injection done with steroids which did provide patient with significant relief of the pain. Unfortunately, this has come back. The patient is here today to see if we can schedule her for a repeat injection. She has only had one of these injections in the past 12 months and therefore we can go ahead and proceed with it. The patient has been warned about the downside of repeating the injection to often. She understands the risks and possible complications. We'll go ahead and schedule her for a left-sided intra-articular steroid knee injection under fluoroscopic guidance, no sedation.  Today's Pain Score: 4  Reported level of pain is compatible with clinical observation Pain Type: Chronic pain Pain Location: Knee Pain Orientation: Left Pain Descriptors / Indicators: Aching (when walking, feels like bone on bone) Pain Frequency: Intermittent       Pharmacotherapy Review: The patient's medication management is currently not under our  care.  Last Available Lab Work: Office Visit on 11/04/2015  Component Date Value Ref Range Status  . Rapid Strep A Screen 11/04/2015 Negative  Negative Final  . Organism ID, Bacteria 11/04/2015 Normal Upper Respiratory Flora   Final  . Organism ID, Bacteria 11/04/2015 No Beta Hemolytic Streptococci Isolated   Final    Allergies: Ann Spencer is allergic to penicillins.  Meds: The patient has a current medication list which includes the following prescription(s): aspirin, calcium carb-cholecalciferol, calcium carbonate, cholecalciferol, ferrous sulfate, hydrochlorothiazide, multivitamin, and omeprazole. Requested Prescriptions    No prescriptions requested or ordered in this encounter    ROS: Constitutional: Afebrile, no chills, well hydrated and well nourished Gastrointestinal: negative Musculoskeletal:negative Neurological: negative Behavioral/Psych: negative  PFSH: Medical:  Ann Spencer  has a past medical history of Phlebitis (2014) and Rotator cuff injury (12/2013). Family: family history includes Varicose Veins in her brother. Surgical:  has past surgical history that includes vein closure (Bilateral, Feb 2008); Abdominal hysterectomy (1981); and Shoulder surgery (Left, 03/03/15). Tobacco:  reports that she quit smoking about 21 years ago. She has never used smokeless tobacco. Alcohol:  reports that she drinks about 4.2 oz of alcohol per week. Drug:  reports that she does not use illicit drugs.  Physical Exam: Vitals:  Today's Vitals   11/17/15 1119 11/17/15 1120  BP:  158/100  Pulse: 90   Temp: 97.8 F (36.6 C)   Resp: 18   Height: 5\' 3"  (1.6 m)   Weight: 223 lb (101.152 kg)   SpO2: 93%   PainSc: 4  4   PainLoc: Knee   Calculated BMI: Body mass index is 39.51 kg/(m^2). General appearance: alert, cooperative, appears stated age, mild distress and morbidly obese Eyes: PERLA  Respiratory: No evidence respiratory distress, no audible rales or ronchi and no use of  accessory muscles of respiration Neck: no adenopathy, no carotid bruit, no JVD, supple, symmetrical, trachea midline and thyroid not enlarged, symmetric, no tenderness/mass/nodules  Cervical Spine ROM: Adequate for flexion, extension, rotation, and lateral bending Palpation: No palpable trigger points  Upper Extremities ROM: Adequate bilaterally Strength: 5/5 for all flexors and extensors of the upper extremity, bilaterally Pulses: Palpable bilaterally Neurologic: No allodynia, No hyperesthesia, No hyperpathia and No sensory abnormalities  Lumbar Spine ROM: Adequate for flexion, extension, rotation, and lateral bending Palpation: No palpable trigger points Lumbar Hyperextension and rotation: Non-contributory Patrick's Maneuver: Non-contributory  Lower Extremities ROM: Adequate bilaterally Strength: 5/5 for all flexors and extensors of the lower extremity, bilaterally Pulses: Palpable bilaterally Neurologic: No allodynia, No hyperesthesia, No hyperpathia, No sensory abnormalities and No antalgic gait or posture. The patient percent with left lower extremity swelling with some evidence of fluid accumulation. There is no redness, increased temperature, or discharge.  Assessment: Encounter Diagnosis:  Primary Diagnosis: Chronic pain of left knee [M25.562, G89.29]  Plan: Interventional: Return to clinic's for a left-sided intra-articular knee injection. No sedation. Fluoroscopic guidance may not be necessary.  Ann Spencer was seen today for knee pain.  Diagnoses and all orders for this visit:  Chronic pain of left knee -     KNEE INJECTION; Future  Primary osteoarthritis of left knee -     KNEE INJECTION; Future  Chronic pain  Arthritis of knee (Left)  Arthropathy of knee (Left)  Knee derangement (Left)  Chronic low back pain  Chronic neck pain  Cervical spondylosis  Lumbar spondylosis  History of head injury  Osteoporosis  Atherosclerotic peripheral vascular  disease [facility])     Patient Instructions  Knee Injection A knee injection is a procedure to get medicine into your knee joint. Your health care provider puts a needle into the joint and injects medicine with an attached syringe. The injected medicine may relieve the pain, swelling, and stiffness of arthritis. The injected medicine may also help to lubricate and cushion your knee joint. You may need more than one injection. LET [facility] CARE PROVIDER KNOW ABOUT:  Any allergies you have.  All medicines you are taking, including vitamins, herbs, eye drops, creams, and over-the-counter medicines.  Previous problems you or members of your family have had with the use of anesthetics.  Any blood disorders you have.  Previous surgeries you have had.  Any medical conditions you may have. RISKS AND COMPLICATIONS Generally, this is a safe procedure. However, problems may occur, including:  Infection.  Bleeding.  Worsening symptoms.  Damage to the area around your knee.  Allergic reaction to any of the medicines.  Skin reactions from repeated injections. BEFORE THE PROCEDURE  Ask your health care provider about changing or stopping your regular medicines. This is especially important if you are taking diabetes medicines or blood thinners.  Plan to have someone take you home after the procedure. PROCEDURE  You will sit or lie down in a position for your knee to be treated.  The skin over your kneecap will be cleaned with a germ-killing solution (antiseptic).  You will be given a medicine that numbs the area (local anesthetic). You may feel some stinging.  After your knee becomes numb, you will have a second injection. This is the medicine. This needle is carefully placed between your kneecap and your knee. The medicine is injected into the joint space.  At the end of  the procedure, the needle will be removed.  A bandage (dressing) may be placed over the injection site. The  procedure may vary among health care providers and hospitals. AFTER THE PROCEDURE  You may have to move your knee through its full range of motion. This helps to get all of the medicine into your joint space.  Your blood pressure, heart rate, breathing rate, and blood oxygen level will be monitored often until the medicines you were given have worn off.  You will be watched to make sure that you do not have a reaction to the injected medicine.   This information is not intended to replace advice given to you by your health care provider. Make sure you discuss any questions you have with your health care provider.   Document Released: 02/19/2007 Document Revised: 12/19/2014 Document Reviewed: 10/08/2014 Elsevier Interactive Patient Education 2016 [employer].    Medications discontinued today:  Medications Discontinued During This Encounter  Medication Reason  . HYDROcodone-homatropine (HYCODAN) 5-1.5 MG/5ML syrup Error  . TURMERIC PO Error   Medications administered today:  Ann Spencer had no medications administered during this visit.  Primary Care Physician: Rubbie Battiest, NP Location: [facility] Facility Note by: Kathlen Brunswick. Dossie Arbour, M.D, DABA, DABAPM, DABPM, DABIPP, FIPP

## 2015-11-17 NOTE — Patient Instructions (Signed)
Knee Injection A knee injection is a procedure to get medicine into your knee joint. Your health care provider puts a needle into the joint and injects medicine with an attached syringe. The injected medicine may relieve the pain, swelling, and stiffness of arthritis. The injected medicine may also help to lubricate and cushion your knee joint. You may need more than one injection. LET YOUR HEALTH CARE PROVIDER KNOW ABOUT:  Any allergies you have.  All medicines you are taking, including vitamins, herbs, eye drops, creams, and over-the-counter medicines.  Previous problems you or members of your family have had with the use of anesthetics.  Any blood disorders you have.  Previous surgeries you have had.  Any medical conditions you may have. RISKS AND COMPLICATIONS Generally, this is a safe procedure. However, problems may occur, including:  Infection.  Bleeding.  Worsening symptoms.  Damage to the area around your knee.  Allergic reaction to any of the medicines.  Skin reactions from repeated injections. BEFORE THE PROCEDURE  Ask your health care provider about changing or stopping your regular medicines. This is especially important if you are taking diabetes medicines or blood thinners.  Plan to have someone take you home after the procedure. PROCEDURE  You will sit or lie down in a position for your knee to be treated.  The skin over your kneecap will be cleaned with a germ-killing solution (antiseptic).  You will be given a medicine that numbs the area (local anesthetic). You may feel some stinging.  After your knee becomes numb, you will have a second injection. This is the medicine. This needle is carefully placed between your kneecap and your knee. The medicine is injected into the joint space.  At the end of the procedure, the needle will be removed.  A bandage (dressing) may be placed over the injection site. The procedure may vary among health care providers  and hospitals. AFTER THE PROCEDURE  You may have to move your knee through its full range of motion. This helps to get all of the medicine into your joint space.  Your blood pressure, heart rate, breathing rate, and blood oxygen level will be monitored often until the medicines you were given have worn off.  You will be watched to make sure that you do not have a reaction to the injected medicine.   This information is not intended to replace advice given to you by your health care provider. Make sure you discuss any questions you have with your health care provider.   Document Released: 02/19/2007 Document Revised: 12/19/2014 Document Reviewed: 10/08/2014 Elsevier Interactive Patient Education 2016 [employer]  

## 2015-11-17 NOTE — Progress Notes (Signed)
Safety precautions to be maintained throughout the outpatient stay will include: orient to surroundings, keep bed in low position, maintain call bell within reach at all times, provide assistance with transfer out of bed and ambulation.  

## 2015-11-19 ENCOUNTER — Other Ambulatory Visit: Payer: Self-pay

## 2015-11-24 ENCOUNTER — Encounter: Payer: Self-pay | Admitting: Pain Medicine

## 2015-11-24 ENCOUNTER — Ambulatory Visit: Payer: Medicare Other | Attending: Pain Medicine | Admitting: Pain Medicine

## 2015-11-24 VITALS — BP 149/93 | HR 86 | Temp 97.8°F | Resp 18 | Ht 63.0 in | Wt 223.0 lb

## 2015-11-24 DIAGNOSIS — M810 Age-related osteoporosis without current pathological fracture: Secondary | ICD-10-CM | POA: Insufficient documentation

## 2015-11-24 DIAGNOSIS — M545 Low back pain: Secondary | ICD-10-CM | POA: Insufficient documentation

## 2015-11-24 DIAGNOSIS — M25569 Pain in unspecified knee: Secondary | ICD-10-CM | POA: Diagnosis not present

## 2015-11-24 DIAGNOSIS — M75102 Unspecified rotator cuff tear or rupture of left shoulder, not specified as traumatic: Secondary | ICD-10-CM | POA: Diagnosis not present

## 2015-11-24 DIAGNOSIS — I868 Varicose veins of other specified sites: Secondary | ICD-10-CM | POA: Diagnosis not present

## 2015-11-24 DIAGNOSIS — X58XXXA Exposure to other specified factors, initial encounter: Secondary | ICD-10-CM | POA: Insufficient documentation

## 2015-11-24 DIAGNOSIS — M542 Cervicalgia: Secondary | ICD-10-CM | POA: Insufficient documentation

## 2015-11-24 DIAGNOSIS — M2392 Unspecified internal derangement of left knee: Secondary | ICD-10-CM

## 2015-11-24 DIAGNOSIS — I10 Essential (primary) hypertension: Secondary | ICD-10-CM | POA: Diagnosis not present

## 2015-11-24 DIAGNOSIS — M25562 Pain in left knee: Secondary | ICD-10-CM

## 2015-11-24 DIAGNOSIS — G8929 Other chronic pain: Secondary | ICD-10-CM | POA: Insufficient documentation

## 2015-11-24 DIAGNOSIS — M1712 Unilateral primary osteoarthritis, left knee: Secondary | ICD-10-CM | POA: Insufficient documentation

## 2015-11-24 MED ORDER — ROPIVACAINE HCL 2 MG/ML IJ SOLN: 4.0000 mL | Freq: Once | INTRAMUSCULAR | Status: DC

## 2015-11-24 MED ORDER — LIDOCAINE HCL (PF) 1 % IJ SOLN: 10.0000 mL | Freq: Once | INTRAMUSCULAR | Status: DC

## 2015-11-24 MED ORDER — METHYLPREDNISOLONE ACETATE 80 MG/ML IJ SUSP: 80.0000 mg | Freq: Once | INTRAMUSCULAR | Status: DC

## 2015-11-24 MED ORDER — METHYLPREDNISOLONE ACETATE 80 MG/ML IJ SUSP
INTRAMUSCULAR | Status: AC
  Administered 2015-11-24: 11:00:00
  Filled 2015-11-24: qty 1

## 2015-11-24 MED ORDER — ROPIVACAINE HCL 2 MG/ML IJ SOLN
INTRAMUSCULAR | Status: AC
Start: 2015-11-24 — End: 2015-11-24
  Administered 2015-11-24: 11:00:00
  Filled 2015-11-24: qty 10

## 2015-11-24 MED ORDER — LIDOCAINE HCL (PF) 1 % IJ SOLN
INTRAMUSCULAR | Status: AC
  Administered 2015-11-24: 11:00:00
  Filled 2015-11-24: qty 5

## 2015-11-24 NOTE — Patient Instructions (Signed)

## 2015-11-24 NOTE — Progress Notes (Signed)
Safety precautions to be maintained throughout the outpatient stay will include: orient to surroundings, keep bed in low position, maintain call bell within reach at all times, provide assistance with transfer out of bed and ambulation.  

## 2015-11-24 NOTE — Progress Notes (Signed)
Patient's Name: Ann Spencer MRN: [id number] DOB: 21-Nov-1941 DOS: 11/24/2015  Primary Reason(s) for Visit: Interventional Pain Management Treatment. CC: Knee Pain I did  Pre-Procedure Assessment: Ann Spencer is a 74 y.o. year old, female patient, seen today for interventional treatment. She has Varicose veins of both lower extremities; Encounter to establish care; Essential hypertension; Rotator cuff tear (Left); URI (upper respiratory infection); Chronic knee pain (Left); Chronic knee osteoarthritis (Left); Chronic pain; Arthritis of knee (Left); Arthropathy of knee (Left); Knee derangement (Left); Chronic low back pain; Chronic neck pain; Cervical spondylosis; Lumbar spondylosis; History of head injury; Osteoporosis; and Atherosclerotic peripheral vascular disease ([place]) on her problem list.. Her primarily concern today is the Knee Pain Verification of the correct person, correct site (including marking of site), and correct procedure were performed and confirmed by the patient. Today's Vitals   11/24/15 1045 11/24/15 1047 11/24/15 1128  BP: 173/91  149/93  Pulse: 84  86  Temp: 97.8 F (36.6 C)    TempSrc: Oral    Resp: 20  18  Height: $Remove'5\' 3"'WuetiPq$  (1.6 m)    Weight: 223 lb (101.152 kg)    SpO2: 97%  98%  PainSc:  5  5   Calculated BMI: Body mass index is 39.51 kg/(m^2). Allergies: She is allergic to penicillins.. Primary Diagnosis: Derangement of left knee [M23.92]  Procedure: Type: Palliative Intra-Articular Knee Injection Region: Lateral  Knee Region Level: Knee Joint Laterality: Left  Indications: Chronic Pain Knee Joint Pain  Consent: Secured. Under the influence of no sedatives a written informed consent was obtained, after having provided information on the risks and possible complications. To fulfill our ethical and legal obligations, as recommended by the American Medical Association's Code of Ethics, we have provided information to the patient about our clinical  impression; the nature and purpose of the treatment or procedure; the risks, benefits, and possible complications of the intervention; alternatives; the risk(s) and benefit(s) of the alternative treatment(s) or procedure(s); and the risk(s) and benefit(s) of doing nothing. The patient was provided information about the risks and possible complications associated with the procedure. These include, but are not limited to, failure to achieve desired goals, infection, bleeding, organ or nerve damage, allergic reactions, paralysis, and death. In the case of intra- or periarticular procedures these may include, but are not limited to, failure to achieve desired goals, infection, bleeding (hemarthrosis), organ or nerve damage, allergic reactions, and death. In addition, the patient was informed that Medicine is not an exact science; therefore, there is also the possibility of unforeseen risks and possible complications that may result in a catastrophic outcome. The patient indicated having understood very clearly. We have given the patient no guarantees and we have made no promises. Enough time was given to the patient to ask questions, all of which were answered to the patient's satisfaction.  Pre-Procedure Preparation: Safety Precautions: Allergies reviewed. Appropriate site, procedure, and patient were confirmed by following the Joint Commission's Universal Protocol (UP.01.01.01), in the form of a "Time Out". The patient was asked to confirm marked site and procedure, before commencing. The patient was asked about blood thinners, or active infections, both of which were denied. Patient was assessed for positional comfort and all pressure points were checked before starting procedure. Monitoring:  As per clinic protocol. Infection Control Precautions: Sterile technique used. Standard Universal Precautions were taken as recommended by the Department of [facility] for Disease Control and Prevention (CDC).  Standard pre-surgical skin prep was conducted. Respiratory hygiene and cough etiquette  was practiced. Hand hygiene observed. Safe injection practices and needle disposal techniques followed. SDV (single dose vial) medications used. Medications properly checked for expiration dates and contaminants. Personal protective equipment (PPE) used: Sterile surgical gloves.  Anesthesia, Analgesia, Anxiolysis: Type: Local Anesthesia Local Anesthetic(s): Lidocaine 1% Route: Subcutaneous IV Access: None. Sedation: Declined. Indication(s): Patient declined.  Description of Procedure Process:  Time-out: "Time-out" completed before starting procedure, as per protocol. Position: Sitting Target Area: Knee Joint Approach: Lateral approach. Area Prepped: Entire knee area, from the mid-thigh to the mid-shin. Prepping solution: ChloraPrep (2% chlorhexidine gluconate and 70% isopropyl alcohol) Safety Precautions: Aspiration looking for blood return was conducted prior to all injections. At no point did we inject any substances, as a needle was being advanced. No attempts were made at seeking any paresthesias. Safe injection practices and needle disposal techniques used. Medications properly checked for expiration dates. SDV (single dose vial) medications used. Description of the Procedure: Protocol guidelines were followed. The patient was placed in position over the fluoroscopy table. The target area was identified and the area prepped in the usual manner. Skin desensitized using vapocoolant spray. Skin & deeper tissues infiltrated with local anesthetic. Appropriate amount of time allowed to pass for local anesthetics to take effect. The procedure needles were then advanced to the target area. Proper needle placement secured. Negative aspiration confirmed. Solution injected in intermittent fashion, asking for systemic symptoms every 0.5cc of injectate. The needles were then removed and the area cleansed, making sure to  leave some of the prepping solution back to take advantage of its long term bactericidal properties. EBL: None Materials & Medications Used:  Needle(s) Used: 22g - 1.5" Needle(s) Medications Administered today: We administered lidocaine (PF), ropivacaine (PF) 2 mg/ml (0.2%), and methylPREDNISolone acetate.Please see chart orders for dosing details.  Imaging Guidance:  Type of Imaging Technique: None Indication(s): Not Applicable Exposure Time: Not Applicable Contrast: None used. Fluoroscopic Guidance: Not used or required. Ultrasound Guidance: Not used or required. Interpretation: N/A  Antibiotics:  Type:  Antibiotics Given (last 72 hours)    None      Indication(s): No indications identified.  Post-operative Assessment:  Complications: No immediate post-treatment complications were observed. Disposition: Return to clinic for follow-up evaluation. The patient tolerated the entire procedure well. A repeat set of vitals were taken after the procedure and the patient was kept under observation following institutional policy, for this type of procedure. The patient was discharged home, once institutional criteria were met. The patient was provided with post-procedure discharge instructions, including a section on how to identify potential problems. Should any problems arise concerning this procedure, the patient was given instructions to immediately contact us, at any time, without hesitation. In any case, we plan to contact the patient by telephone for a follow-up status report regarding this interventional procedure. Comments:  No additional relevant information.  Primary Care Physician: Rubbie Battiest, NP Location: [facility] Out[facility] Pain Management Facility Note by: Kathlen Brunswick. Dossie Arbour, M.D, DABA, DABAPM, DABPM, DABIPP, FIPP  Disclaimer:  Medicine is not an exact science. The only guarantee in medicine is that nothing is guaranteed. It is important to note that the decision to proceed  with this intervention was based on the information collected from the patient. The Data and conclusions were drawn from the patient's questionnaire, the interview, and the physical examination. Because the information was provided in large part by the patient, it cannot be guaranteed that it has not been purposely or unconsciously manipulated. Every effort has been made to obtain  as much relevant data as possible for this evaluation. It is important to note that the conclusions that lead to this procedure are derived in large part from the available data. Always take into account that the treatment will also be dependent on availability of resources and existing treatment guidelines, considered by other Pain Management Practitioners as being common knowledge and practice, at the time of the intervention. For Medico-Legal purposes, it is also important to point out that variation in procedural techniques and pharmacological choices are the acceptable norm. The indications, contraindications, technique, and results of the above procedure should only be interpreted and judged by a Board-Certified Interventional Pain Specialist with extensive familiarity and expertise in the same exact procedure and technique. Attempts at providing opinions without similar or greater experience and expertise than that of the treating physician will be considered as inappropriate and unethical, and shall result in a formal complaint to the state medical board and applicable specialty societies. Daisyreviewed the middle ear

## 2015-11-25 ENCOUNTER — Telehealth: Payer: Self-pay | Admitting: *Deleted

## 2015-11-25 NOTE — Telephone Encounter (Signed)
Left voice mail

## 2015-12-17 ENCOUNTER — Ambulatory Visit: Payer: Medicare Other | Admitting: Pain Medicine

## 2015-12-24 ENCOUNTER — Encounter: Payer: Self-pay | Admitting: *Deleted

## 2016-02-03 ENCOUNTER — Ambulatory Visit
Admission: RE | Admit: 2016-02-03 | Discharge: 2016-02-03 | Disposition: A | Discharge status: Home / Self Care | Payer: Medicare Other | Source: Ambulatory Visit | Attending: Family Medicine | Admitting: Family Medicine

## 2016-02-03 ENCOUNTER — Encounter: Payer: Self-pay | Admitting: Family Medicine

## 2016-02-03 ENCOUNTER — Ambulatory Visit (INDEPENDENT_AMBULATORY_CARE_PROVIDER_SITE_OTHER): Payer: Medicare Other | Admitting: Family Medicine

## 2016-02-03 VITALS — BP 124/82 | HR 87 | Temp 97.7°F | Wt 227.2 lb

## 2016-02-03 DIAGNOSIS — S8991XA Unspecified injury of right lower leg, initial encounter: Secondary | ICD-10-CM | POA: Diagnosis not present

## 2016-02-03 DIAGNOSIS — M7989 Other specified soft tissue disorders: Secondary | ICD-10-CM

## 2016-02-03 DIAGNOSIS — M79661 Pain in right lower leg: Secondary | ICD-10-CM | POA: Diagnosis not present

## 2016-02-03 DIAGNOSIS — R600 Localized edema: Secondary | ICD-10-CM | POA: Diagnosis not present

## 2016-02-03 DIAGNOSIS — S99911A Unspecified injury of right ankle, initial encounter: Secondary | ICD-10-CM | POA: Diagnosis not present

## 2016-02-03 DIAGNOSIS — M25571 Pain in right ankle and joints of right foot: Secondary | ICD-10-CM | POA: Diagnosis not present

## 2016-02-03 MED ORDER — DOXYCYCLINE HYCLATE 100 MG PO TABS: 100.0000 mg | ORAL_TABLET | Freq: Two times a day (BID) | ORAL | Status: DC

## 2016-02-03 NOTE — Progress Notes (Signed)
Pre visit review using our clinic review tool, if applicable. No additional management support is needed unless otherwise documented below in the visit note. 

## 2016-02-03 NOTE — Patient Instructions (Signed)
Nice to meet you. Your leg swelling is concerning as it is not improved since your injury. We'll obtain an ultrasound to evaluate for blood clot. We will additionally treat you for a cellulitis with doxycycline. We will also obtain x-rays of your right lower leg and ankle to ensure that there is no fracture.  please take a probiotic while you are on the antibiotic for the skin infection. If you develop worsening pain or swelling , or chest pain, shortness of breath, fevers, or any new or changing symptoms medical attention.

## 2016-02-03 NOTE — Progress Notes (Signed)
Patient ID: Ann Spencer, female   DOB: 1941-02-16, 75 y.o.   MRN: [id number]  Ann Rumps, MD Phone: [phone]  Ann Spencer is a 75 y.o. female who presents today for  Same-day visit.  Right leg pain: notes she hurt herself 2 weeks ago when she walked in to her trash can. She notes immediate pain at that time. She did not fall or have a head injury or LOC. Notes the pain has persisted since that time. She has developed swelling in her lower right leg and calf. She notes erythema surrounding the wound and warmth to this area. No fevers. Denies history of DVT. Notes she is able to walk on it though it is painful. Has been using Neosporin on it.  PMH: former smoker.   ROS see HPI  Objective  Physical Exam Filed Vitals:   02/03/16 1058  BP: 124/82  Pulse: 87  Temp: 97.7 F (36.5 C)    BP Readings from Last 3 Encounters:  02/03/16 124/82  11/24/15 149/93  11/17/15 158/100   Wt Readings from Last 3 Encounters:  02/03/16 227 lb 3.2 oz (103.057 kg)  11/24/15 223 lb (101.152 kg)  11/17/15 223 lb (101.152 kg)    Physical Exam  Constitutional: She is well-developed, well-nourished, and in no distress.  HENT:  Head: Normocephalic and atraumatic.  Cardiovascular: Normal rate, regular rhythm and normal heart sounds.  Exam reveals no gallop and no friction rub.   No murmur heard. Pulmonary/Chest: Effort normal and breath sounds normal. No respiratory distress. She has no wheezes. She has no rales.  Musculoskeletal:  Right lower leg with swelling noted in distal portion of leg and calf, there is an anterior wound noted with surrounding erythema and warmth of ~5-6 cm diameter, mild induration, no fluctuance, there is tenderness in this area, some mild calf tenderness, no cords palpated, 2+ DP pulses on the right, left lower extremity normal on evaluation, 2+ DP pulses on the left Right calf 44 cm, left calf 43 cm  Neurological:  5 out of 5 strength in bilateral plantar  flexion and dorsiflexion, sensation to light touch intact in bilateral lower extremities  Skin: Skin is warm and dry. She is not diaphoretic.    Assessment/Plan: Please see individual problem list.  Right leg swelling Patient is status post injury 2 weeks ago to her right lower extremity. She's had significant swelling and some pain with this since that time. Concerning causes would be cellulitis given exam findings, DVT given swelling, and fracture given persistence of pain. Patient does appear to have a likely skin infection and thus we will treat with doxycycline given her penicillin allergy. She was advised on probiotics and yogurt while on antibiotics. We will obtain ultrasound of her right lower extremity to evaluate for DVT. We will obtain x-ray imaging of her right tibia/fibula and right ankle to rule out fracture. She is given return precautions.    Orders Placed This Encounter  Procedures  . US Venous Img Lower Unilateral Right    Standing Status: Future     Number of Occurrences:      Standing Expiration Date: 04/02/2017    Order Specific Question:  Reason for Exam (SYMPTOM  OR DIAGNOSIS REQUIRED)    Answer:  RLE swelling and pain s/p injury 2 weeks ago    Order Specific Question:  Preferred imaging location?    Answer:  [facility]  . DG Tibia/Fibula Right    Standing Status: Future     Number  of Occurrences:      Standing Expiration Date: 04/02/2017    Order Specific Question:  Reason for Exam (SYMPTOM  OR DIAGNOSIS REQUIRED)    Answer:  RLE anterior shin pain    Order Specific Question:  Preferred imaging location?    Answer:  [facility]  . DG Ankle Complete Right    Standing Status: Future     Number of Occurrences:      Standing Expiration Date: 04/02/2017    Order Specific Question:  Reason for Exam (SYMPTOM  OR DIAGNOSIS REQUIRED)    Answer:  right leg pain s/p injury 2 weeks ago    Order Specific Question:  Preferred imaging location?    Answer:   [facility] ordered this encounter  Medications  . doxycycline (VIBRA-TABS) 100 MG tablet    Sig: Take 1 tablet (100 mg total) by mouth 2 (two) times daily.    Dispense:  14 tablet    Refill:  0     Ann Spencer

## 2016-02-03 NOTE — Assessment & Plan Note (Addendum)
Patient is status post injury 2 weeks ago to her right lower extremity. She's had significant swelling and some pain with this since that time. Concerning causes would be cellulitis given exam findings, DVT given swelling, and fracture given persistence of pain. Patient does appear to have a likely skin infection and thus we will treat with doxycycline given her penicillin allergy. She was advised on probiotics and yogurt while on antibiotics. We will obtain ultrasound of her right lower extremity to evaluate for DVT. We will obtain x-ray imaging of her right tibia/fibula and right ankle to rule out fracture. She is given return precautions.

## 2016-02-08 ENCOUNTER — Encounter: Payer: Self-pay | Admitting: Nurse Practitioner

## 2016-02-08 ENCOUNTER — Ambulatory Visit (INDEPENDENT_AMBULATORY_CARE_PROVIDER_SITE_OTHER): Payer: Medicare Other | Admitting: Nurse Practitioner

## 2016-02-08 VITALS — BP 108/72 | HR 82 | Temp 97.9°F | Resp 16 | Ht 63.0 in | Wt 221.2 lb

## 2016-02-08 DIAGNOSIS — M7989 Other specified soft tissue disorders: Secondary | ICD-10-CM

## 2016-02-08 DIAGNOSIS — J069 Acute upper respiratory infection, unspecified: Secondary | ICD-10-CM | POA: Diagnosis not present

## 2016-02-08 MED ORDER — SULFAMETHOXAZOLE-TRIMETHOPRIM 800-160 MG PO TABS: 1.0000 | ORAL_TABLET | Freq: Two times a day (BID) | ORAL | Status: DC

## 2016-02-08 MED ORDER — FLUTICASONE PROPIONATE 50 MCG/ACT NA SUSP: 2.0000 | Freq: Every day | NASAL | Status: DC

## 2016-02-08 MED ORDER — PROMETHAZINE-CODEINE 6.25-10 MG/5ML PO SYRP: 5.0000 mL | ORAL_SOLUTION | Freq: Every evening | ORAL | Status: DC | PRN

## 2016-02-08 NOTE — Progress Notes (Signed)
Patient ID: Ann Spencer, female    DOB: 10-20-1941  Age: 75 y.o. MRN: [id number]  CC: Follow-up and Sinusitis   HPI Ann Spencer [facility] presents for follow up of follow up of leg wound and URI symptoms   1) Pt was seen by Dr. Caryl Bis on 02/03/16 for Right leg swelling after walking into her trash can. He was concerned for cellulitis. Doxycyline led to her feeling ill. She describes "making her sick", but denies vomiting. Feels nauseated. Denies diarrhea.  Pt was eating yogurt while on Doxycyline, denies lying down soon after taking.   [place] Right LE Venous- No DVT  X-rays- Soft tissue swelling, degenerative changes to right knee/ankle, PVD noted  2) Coughing a lot  Sinus pressure frontal and maxillary  Denies any treatment to date  Pt denies fever, chills, sweats   History Ann Spencer has a past medical history of Phlebitis (2014) and Rotator cuff injury (12/2013).   She has past surgical history that includes vein closure (Bilateral, Feb 2008); Abdominal hysterectomy (1981); and Shoulder surgery (Left, 03/03/15).   Her family history includes Varicose Veins in her brother.She reports that she quit smoking about 22 years ago. She has never used smokeless tobacco. She reports that she drinks about 4.2 oz of alcohol per week. She reports that she does not use illicit drugs.  Outpatient Prescriptions Prior to Visit  Medication Sig Dispense Refill  . aspirin 81 MG tablet Take 81 mg by mouth daily.    . Calcium Carb-Cholecalciferol [phone] MG-UNIT TABS Take 1 tablet by mouth daily.    . Calcium Carbonate (CALTRATE 600 PO) Take 1 tablet by mouth daily.    . Cholecalciferol (D3 ADULT PO) Take 1 tablet by mouth daily.    . ferrous sulfate 325 (65 FE) MG EC tablet Take 325 mg by mouth daily with breakfast.    . hydrochlorothiazide (HYDRODIURIL) 12.5 MG tablet TAKE ONE TABLET BY MOUTH ONCE DAILY 30 tablet 5  . Multiple Vitamin (MULTIVITAMIN) capsule Take 1 capsule by mouth daily.    Marland Kitchen omeprazole  (PRILOSEC) 20 MG capsule Take 20 mg by mouth daily.    Marland Kitchen doxycycline (VIBRA-TABS) 100 MG tablet Take 1 tablet (100 mg total) by mouth 2 (two) times daily. 14 tablet 0   Facility-Administered Medications Prior to Visit  Medication Dose Route Frequency Provider Last Rate Last Dose  . lidocaine (PF) (XYLOCAINE) 1 % injection 10 mL  10 mL Infiltration Once Milinda Pointer, MD      . methylPREDNISolone acetate (DEPO-MEDROL) injection 80 mg  80 mg Intra-articular Once Milinda Pointer, MD      . ropivacaine (PF) 2 mg/ml (0.2%) (NAROPIN) epidural 4 mL  4 mL Other Once Milinda Pointer, MD        ROS Review of Systems  Constitutional: Positive for fatigue. Negative for fever, chills and diaphoresis.  HENT: Positive for congestion and sinus pressure. Negative for ear pain, postnasal drip and rhinorrhea.   Respiratory: Positive for cough. Negative for chest tightness, shortness of breath and wheezing.   Cardiovascular: Negative for chest pain, palpitations and leg swelling.  Gastrointestinal: Positive for nausea. Negative for vomiting and diarrhea.  Skin: Positive for color change. Negative for rash.  Neurological: Negative for dizziness and headaches.    Objective:  BP 108/72 mmHg  Pulse 82  Temp(Src) 97.9 F (36.6 C) (Oral)  Resp 16  Ht 5\' 3"  (1.6 m)  Wt 221 lb 3.2 oz (100.336 kg)  BMI 39.19 kg/m2  SpO2 94% 97% on Repeat  Physical Exam  Constitutional: She is oriented to person, place, and time. She appears well-developed and well-nourished. No distress.  HENT:  Head: Normocephalic and atraumatic.  Right Ear: External ear normal.  Left Ear: External ear normal.  Mouth/Throat: No oropharyngeal exudate.  TM's clear bilaterally Maxillary sinuses tender to palpation   Eyes: EOM are normal. Pupils are equal, round, and reactive to light. Right eye exhibits no discharge. Left eye exhibits no discharge. No scleral icterus.  Neck: Normal range of motion. Neck supple.  Cardiovascular:  Normal rate, regular rhythm and normal heart sounds.  Exam reveals no gallop and no friction rub.   No murmur heard. Pulmonary/Chest: Effort normal and breath sounds normal. No respiratory distress. She has no wheezes. She has no rales. She exhibits no tenderness.  Lymphadenopathy:    She has no cervical adenopathy.  Neurological: She is alert and oriented to person, place, and time. No cranial nerve deficit. She exhibits normal muscle tone. Coordination normal.  Skin: Skin is warm and dry. No rash noted. She is not diaphoretic. There is erythema.  PVD noted R>L leg 3.5 cm x 4 cm of erythema left not warm 4 mm scab centrally  Psychiatric: She has a normal mood and affect. Her behavior is normal. Judgment and thought content normal.    Assessment & Plan:   Ann Spencer was seen today for follow-up and sinusitis.  Diagnoses and all orders for this visit:  Right leg swelling  URI (upper respiratory infection)  Other orders -     sulfamethoxazole-trimethoprim (BACTRIM DS,SEPTRA DS) 800-160 MG tablet; Take 1 tablet by mouth 2 (two) times daily. -     promethazine-codeine (PHENERGAN WITH CODEINE) 6.25-10 MG/5ML syrup; Take 5 mLs by mouth at bedtime as needed for cough. -     fluticasone (FLONASE) 50 MCG/ACT nasal spray; Place 2 sprays into both nostrils daily.   I have discontinued Ann Spencer doxycycline. I am also having her start on sulfamethoxazole-trimethoprim, promethazine-codeine, and fluticasone. Additionally, I am having her maintain her omeprazole, ferrous sulfate, multivitamin, Calcium Carbonate (CALTRATE 600 PO), aspirin, Calcium Carb-Cholecalciferol, Cholecalciferol (D3 ADULT PO), and hydrochlorothiazide. We will continue to administer methylPREDNISolone acetate, lidocaine (PF), and ropivacaine (PF) 2 mg/ml (0.2%).  Meds ordered this encounter  Medications  . sulfamethoxazole-trimethoprim (BACTRIM DS,SEPTRA DS) 800-160 MG tablet    Sig: Take 1 tablet by mouth 2 (two) times daily.     Dispense:  10 tablet    Refill:  0    Order Specific Question:  Supervising Provider    Answer:  Deborra Medina L [[id number]]  . promethazine-codeine (PHENERGAN WITH CODEINE) 6.25-10 MG/5ML syrup    Sig: Take 5 mLs by mouth at bedtime as needed for cough.    Dispense:  120 mL    Refill:  0    Order Specific Question:  Supervising Provider    Answer:  Deborra Medina L [[id number]]  . fluticasone (FLONASE) 50 MCG/ACT nasal spray    Sig: Place 2 sprays into both nostrils daily.    Dispense:  16 g    Refill:  6    Order Specific Question:  Supervising Provider    Answer:  Crecencio Mc [[id number]]     Follow-up: Return in about 2 weeks (around 02/22/2016) for Follow up.

## 2016-02-08 NOTE — Patient Instructions (Addendum)
Please take a probiotic ( Align, Floraque or Culturelle) while you are on the antibiotic to prevent a serious antibiotic associated diarrhea  Called clostirudium dificile colitis and a vaginal yeast infection. Generics are fine!  Bactrim DS twice daily for 5 days.   Flonase as needed for nasal concerns.   Follow up in 2 weeks.

## 2016-02-10 ENCOUNTER — Ambulatory Visit (INDEPENDENT_AMBULATORY_CARE_PROVIDER_SITE_OTHER): Payer: Medicare Other | Admitting: General Surgery

## 2016-02-10 ENCOUNTER — Encounter: Payer: Self-pay | Admitting: General Surgery

## 2016-02-10 VITALS — BP 130/74 | HR 86 | Resp 12 | Ht 66.0 in | Wt 217.0 lb

## 2016-02-10 DIAGNOSIS — I8312 Varicose veins of left lower extremity with inflammation: Secondary | ICD-10-CM

## 2016-02-10 NOTE — Patient Instructions (Signed)
Follow up 2 weeks.

## 2016-02-10 NOTE — Progress Notes (Signed)
Patient ID: Ann Spencer, female   DOB: July 12, 1941, 75 y.o.   MRN: [id number]  Chief Complaint  Patient presents with  . Follow-up    leg pain and redness    HPI Ann Spencer is a 75 y.o. female here today for a evaluation of redness and pain in her right leg. She injured her leg when she fell on 01-21-16.  Patient states it turned red and she has cellulitis. Today her left lower leg started turning red. She does wear her compression hose daily. Currently she is on septra. Hospital performed ultrasound and xray of ankle area.  Currently on an antibiotic for upper respiratory infection.  I have reviewed the history of present illness with the patient.    HPI  Past Medical History  Diagnosis Date  . Phlebitis 2014  . Rotator cuff injury 12/2013    Past Surgical History  Procedure Laterality Date  . Vein closure Bilateral Feb 2008  . Abdominal hysterectomy  1981    Full  . Shoulder surgery Left 03/03/15    Family History  Problem Relation Age of Onset  . Varicose Veins Brother     Social History Social History  Substance Use Topics  . Smoking status: Former Smoker -- 30 years    Quit date: 12/12/1993  . Smokeless tobacco: Never Used  . Alcohol Use: 4.2 oz/week    7 Standard drinks or equivalent per week     Comment: Wine     Allergies  Allergen Reactions  . Penicillins     Current Outpatient Prescriptions  Medication Sig Dispense Refill  . aspirin 81 MG tablet Take 81 mg by mouth daily.    . Calcium Carb-Cholecalciferol [phone] MG-UNIT TABS Take 1 tablet by mouth daily.    . Calcium Carbonate (CALTRATE 600 PO) Take 1 tablet by mouth daily.    . Cholecalciferol (D3 ADULT PO) Take 1 tablet by mouth daily.    . ferrous sulfate 325 (65 FE) MG EC tablet Take 325 mg by mouth daily with breakfast.    . fluticasone (FLONASE) 50 MCG/ACT nasal spray Place 2 sprays into both nostrils daily. 16 g 6  . hydrochlorothiazide (HYDRODIURIL) 12.5 MG tablet TAKE ONE TABLET BY  MOUTH ONCE DAILY 30 tablet 5  . Multiple Vitamin (MULTIVITAMIN) capsule Take 1 capsule by mouth daily.    Marland Kitchen omeprazole (PRILOSEC) 20 MG capsule Take 20 mg by mouth daily.    . promethazine-codeine (PHENERGAN WITH CODEINE) 6.25-10 MG/5ML syrup Take 5 mLs by mouth at bedtime as needed for cough. 120 mL 0  . sulfamethoxazole-trimethoprim (BACTRIM DS,SEPTRA DS) 800-160 MG tablet Take 1 tablet by mouth 2 (two) times daily. 10 tablet 0   Current Facility-Administered Medications  Medication Dose Route Frequency Provider Last Rate Last Dose  . lidocaine (PF) (XYLOCAINE) 1 % injection 10 mL  10 mL Infiltration Once Milinda Pointer, MD      . methylPREDNISolone acetate (DEPO-MEDROL) injection 80 mg  80 mg Intra-articular Once Milinda Pointer, MD      . ropivacaine (PF) 2 mg/ml (0.2%) (NAROPIN) epidural 4 mL  4 mL Other Once Milinda Pointer, MD        Review of Systems Review of Systems  Constitutional: Negative.   Respiratory: Negative.   Cardiovascular: Negative.     Blood pressure 130/74, pulse 86, resp. rate 12, height 5\' 6"  (1.676 m), weight 217 lb (98.431 kg).  Physical Exam Physical Exam  Constitutional: She is oriented to person, place, and time. She appears  well-developed and well-nourished.  Eyes: Conjunctivae are normal. No scleral icterus.  Neck: Neck supple.  Cardiovascular: Normal rate and normal heart sounds.   Pulses:      Dorsalis pedis pulses are 2+ on the right side, and 2+ on the left side.       Posterior tibial pulses are 2+ on the left side.  Bilateral edema   Lymphadenopathy:    She has no cervical adenopathy.  Neurological: She is alert and oriented to person, place, and time.  Skin: Skin is warm and dry.     both the areas of redness in the legs appear to be exacerbation of stasis dermatitis  Data Reviewed Notes reviewed  Assessment    Venous insufficiency with stasis changes in both legs.     Plan   Bilateral una boot and compression     PCP:   Lorane Gell  This information has been scribed by Karie Fetch RNBC. Marland Kitchen    Kyle Stansell G 02/10/2016, 1:37 PM

## 2016-02-14 NOTE — Assessment & Plan Note (Signed)
New onset Will treat conservatively due to probable viral nature Sent flonase to pharmacy Pt on bactrim DS for leg Phenergan w/ codeine (for nausea/cough) syrup printed, signed, and given to pt to take to pharmacy  Cautioned on drowsy effects and how much to take- STRONGLY advised her to use fall precautions at home. FU prn worsening/failure to improve.

## 2016-02-14 NOTE — Assessment & Plan Note (Signed)
Improving. Pt cannot tolerate Doxycyline. Switching to Bactrim DS. Advised oral probiotics vs. Yogurt. Pt seeing Dr. Jamal Collin soon. Will follow

## 2016-02-17 ENCOUNTER — Ambulatory Visit (INDEPENDENT_AMBULATORY_CARE_PROVIDER_SITE_OTHER): Payer: Medicare Other | Admitting: *Deleted

## 2016-02-17 DIAGNOSIS — I8312 Varicose veins of left lower extremity with inflammation: Secondary | ICD-10-CM | POA: Diagnosis not present

## 2016-02-17 NOTE — Progress Notes (Signed)
The patient came in today for unna boot dressing change.  Both legs were washed with soap and water.  Unna boots, kerlix and coban applied.  Edema improving.The area of concern is improving, no redness noted. Small scab right lower leg remains. Follow up as scheduled.

## 2016-02-17 NOTE — Patient Instructions (Signed)
The patient is aware to call back for any questions or concerns.  

## 2016-02-24 ENCOUNTER — Encounter: Payer: Self-pay | Admitting: General Surgery

## 2016-02-24 ENCOUNTER — Ambulatory Visit (INDEPENDENT_AMBULATORY_CARE_PROVIDER_SITE_OTHER): Payer: Medicare Other | Admitting: General Surgery

## 2016-02-24 VITALS — BP 132/78 | HR 80 | Resp 12 | Ht 66.0 in | Wt 222.0 lb

## 2016-02-24 DIAGNOSIS — I8312 Varicose veins of left lower extremity with inflammation: Secondary | ICD-10-CM

## 2016-02-24 NOTE — Patient Instructions (Signed)
Patient to return as needed. 

## 2016-02-24 NOTE — Progress Notes (Signed)
This is a 75 year old female here today for her follow up unna boot. Patient has no complaints I have reviewed the history of present illness with the patient. Marked reduction in edema in both legs. The stasis discoloration has also significantly reduced. The abrasion on the right leg has fully healed. Pulses equal and intact. Patient can resume use of compression hose. Follow up PRN  Patient to return as needed. PCP:  Lorane Gell  This information has been scribed by Gaspar Cola CMA.

## 2016-02-25 ENCOUNTER — Ambulatory Visit (INDEPENDENT_AMBULATORY_CARE_PROVIDER_SITE_OTHER): Payer: Medicare Other | Admitting: Nurse Practitioner

## 2016-02-25 ENCOUNTER — Encounter: Payer: Self-pay | Admitting: Nurse Practitioner

## 2016-02-25 VITALS — BP 116/80 | HR 92 | Temp 98.2°F | Wt 221.0 lb

## 2016-02-25 DIAGNOSIS — M810 Age-related osteoporosis without current pathological fracture: Secondary | ICD-10-CM

## 2016-02-25 DIAGNOSIS — Z1211 Encounter for screening for malignant neoplasm of colon: Secondary | ICD-10-CM

## 2016-02-25 DIAGNOSIS — M7989 Other specified soft tissue disorders: Secondary | ICD-10-CM

## 2016-02-25 NOTE — Progress Notes (Signed)
Pre visit review using our clinic review tool, if applicable. No additional management support is needed unless otherwise documented below in the visit note. 

## 2016-02-25 NOTE — Patient Instructions (Addendum)
You are doing great! I will see you when you need me.   We will get you set up for bone density and cologuard.

## 2016-02-29 ENCOUNTER — Ambulatory Visit: Payer: Medicare Other | Admitting: General Surgery

## 2016-03-06 DIAGNOSIS — Z1211 Encounter for screening for malignant neoplasm of colon: Secondary | ICD-10-CM | POA: Insufficient documentation

## 2016-03-06 NOTE — Assessment & Plan Note (Signed)
-   Ordered Cologuard.

## 2016-03-06 NOTE — Assessment & Plan Note (Signed)
Fully improved with help from Dr. Jamal Collin and the Ardelia Mems boot therapy Wearing compression hose and reports daily compliance  Very happy today

## 2016-03-06 NOTE — Progress Notes (Signed)
Patient ID: Ann Spencer, female    DOB: 1941/03/17  Age: 75 y.o. MRN: [id number]  CC: Follow-up   HPI Ann Spencer [facility] presents for follow up of stasis dermatitis.   1) Pt reports complete improvement of  leg stasis dermatitis with una boot per Dr. Jamal Collin She is wearing her compression hose She is very happy with everything and reports she is doing great  History Ann Spencer has a past medical history of Phlebitis (2014) and Rotator cuff injury (12/2013).   She has past surgical history that includes vein closure (Bilateral, Feb 2008); Abdominal hysterectomy (1981); and Shoulder surgery (Left, 03/03/15).   Her family history includes Varicose Veins in her brother.She reports that she quit smoking about 22 years ago. She has never used smokeless tobacco. She reports that she drinks about 4.2 oz of alcohol per week. She reports that she does not use illicit drugs.  Outpatient Prescriptions Prior to Visit  Medication Sig Dispense Refill  . aspirin 81 MG tablet Take 81 mg by mouth daily.    . Calcium Carbonate (CALTRATE 600 PO) Take 1 tablet by mouth daily.    . Cholecalciferol (D3 ADULT PO) Take 1 tablet by mouth daily.    . ferrous sulfate 325 (65 FE) MG EC tablet Take 325 mg by mouth daily with breakfast.    . fluticasone (FLONASE) 50 MCG/ACT nasal spray Place 2 sprays into both nostrils daily. 16 g 6  . hydrochlorothiazide (HYDRODIURIL) 12.5 MG tablet TAKE ONE TABLET BY MOUTH ONCE DAILY 30 tablet 5  . Multiple Vitamin (MULTIVITAMIN) capsule Take 1 capsule by mouth daily.    Marland Kitchen omeprazole (PRILOSEC) 20 MG capsule Take 20 mg by mouth daily.    . Calcium Carb-Cholecalciferol [phone] MG-UNIT TABS Take 1 tablet by mouth daily.    . promethazine-codeine (PHENERGAN WITH CODEINE) 6.25-10 MG/5ML syrup Take 5 mLs by mouth at bedtime as needed for cough. 120 mL 0  . sulfamethoxazole-trimethoprim (BACTRIM DS,SEPTRA DS) 800-160 MG tablet Take 1 tablet by mouth 2 (two) times daily. 10 tablet 0    Facility-Administered Medications Prior to Visit  Medication Dose Route Frequency Provider Last Rate Last Dose  . lidocaine (PF) (XYLOCAINE) 1 % injection 10 mL  10 mL Infiltration Once Milinda Pointer, MD      . methylPREDNISolone acetate (DEPO-MEDROL) injection 80 mg  80 mg Intra-articular Once Milinda Pointer, MD      . ropivacaine (PF) 2 mg/ml (0.2%) (NAROPIN) epidural 4 mL  4 mL Other Once Milinda Pointer, MD        ROS Review of Systems  Constitutional: Negative for fever, chills, diaphoresis and fatigue.  Cardiovascular: Negative for leg swelling.  Skin: Negative for color change, pallor, rash and wound.    Objective:  BP 116/80 mmHg  Pulse 92  Temp(Src) 98.2 F (36.8 C) (Oral)  Wt 221 lb (100.245 kg)  Physical Exam  Constitutional: She is oriented to person, place, and time. She appears well-developed and well-nourished. No distress.  Musculoskeletal: Normal range of motion. She exhibits no edema or tenderness.  Wearing compression hose today  Neurological: She is alert and oriented to person, place, and time.  Skin: Skin is warm and dry. She is not diaphoretic.  Psychiatric: She has a normal mood and affect. Her behavior is normal. Judgment and thought content normal.      Assessment & Plan:   Ann Spencer was seen today for follow-up.  Diagnoses and all orders for this visit:  Special screening for malignant neoplasms, colon -  Cologuard  Right leg swelling  Osteoporosis -     DG Bone Density; Future   I have discontinued Ms. Dowen's Calcium Carb-Cholecalciferol, sulfamethoxazole-trimethoprim, and promethazine-codeine. I am also having her maintain her omeprazole, ferrous sulfate, multivitamin, Calcium Carbonate (CALTRATE 600 PO), aspirin, Cholecalciferol (D3 ADULT PO), hydrochlorothiazide, and fluticasone. We will continue to administer methylPREDNISolone acetate, lidocaine (PF), and ropivacaine (PF) 2 mg/ml (0.2%).  No orders of the defined types  were placed in this encounter.     Follow-up: Return if symptoms worsen or fail to improve.

## 2016-03-06 NOTE — Assessment & Plan Note (Signed)
Needs repeat DXA

## 2016-04-01 ENCOUNTER — Encounter: Payer: Self-pay | Admitting: Nurse Practitioner

## 2016-04-08 ENCOUNTER — Telehealth: Payer: Self-pay | Admitting: Nurse Practitioner

## 2016-04-08 NOTE — Telephone Encounter (Signed)
Left msg to call office to schedule AWV with Denisa/msn °

## 2016-04-14 ENCOUNTER — Ambulatory Visit: Payer: Medicare Other

## 2016-04-20 NOTE — Telephone Encounter (Signed)
Left msg to call office to schedule AWV with Denisa/msn °

## 2016-05-26 ENCOUNTER — Telehealth: Payer: Self-pay

## 2016-05-31 ENCOUNTER — Encounter: Payer: Self-pay | Admitting: Pain Medicine

## 2016-05-31 ENCOUNTER — Ambulatory Visit: Payer: Medicare Other | Attending: Pain Medicine | Admitting: Pain Medicine

## 2016-05-31 VITALS — BP 135/86 | HR 87 | Temp 97.7°F | Resp 18 | Ht 65.0 in | Wt 220.0 lb

## 2016-05-31 DIAGNOSIS — M542 Cervicalgia: Secondary | ICD-10-CM | POA: Diagnosis not present

## 2016-05-31 DIAGNOSIS — M25552 Pain in left hip: Secondary | ICD-10-CM | POA: Insufficient documentation

## 2016-05-31 DIAGNOSIS — M25562 Pain in left knee: Secondary | ICD-10-CM | POA: Diagnosis not present

## 2016-05-31 DIAGNOSIS — M75102 Unspecified rotator cuff tear or rupture of left shoulder, not specified as traumatic: Secondary | ICD-10-CM | POA: Insufficient documentation

## 2016-05-31 DIAGNOSIS — M1712 Unilateral primary osteoarthritis, left knee: Secondary | ICD-10-CM | POA: Insufficient documentation

## 2016-05-31 DIAGNOSIS — M47812 Spondylosis without myelopathy or radiculopathy, cervical region: Secondary | ICD-10-CM | POA: Diagnosis not present

## 2016-05-31 DIAGNOSIS — I70209 Unspecified atherosclerosis of native arteries of extremities, unspecified extremity: Secondary | ICD-10-CM | POA: Diagnosis not present

## 2016-05-31 DIAGNOSIS — M810 Age-related osteoporosis without current pathological fracture: Secondary | ICD-10-CM | POA: Insufficient documentation

## 2016-05-31 DIAGNOSIS — I10 Essential (primary) hypertension: Secondary | ICD-10-CM | POA: Insufficient documentation

## 2016-05-31 DIAGNOSIS — M47816 Spondylosis without myelopathy or radiculopathy, lumbar region: Secondary | ICD-10-CM | POA: Diagnosis not present

## 2016-05-31 DIAGNOSIS — G8929 Other chronic pain: Secondary | ICD-10-CM | POA: Diagnosis not present

## 2016-05-31 DIAGNOSIS — I868 Varicose veins of other specified sites: Secondary | ICD-10-CM | POA: Diagnosis not present

## 2016-05-31 DIAGNOSIS — Z6836 Body mass index (BMI) 36.0-36.9, adult: Secondary | ICD-10-CM | POA: Diagnosis not present

## 2016-05-31 DIAGNOSIS — J069 Acute upper respiratory infection, unspecified: Secondary | ICD-10-CM | POA: Diagnosis not present

## 2016-05-31 DIAGNOSIS — M25569 Pain in unspecified knee: Secondary | ICD-10-CM | POA: Diagnosis present

## 2016-05-31 MED ORDER — ROPIVACAINE HCL 2 MG/ML IJ SOLN
INTRAMUSCULAR | Status: AC
  Administered 2016-05-31: 11:00:00
  Filled 2016-05-31: qty 10

## 2016-05-31 MED ORDER — ROPIVACAINE HCL 2 MG/ML IJ SOLN
4.0000 mL | Freq: Once | INTRAMUSCULAR | Status: DC
  Filled 2016-05-31: qty 10

## 2016-05-31 MED ORDER — LIDOCAINE HCL (PF) 1 % IJ SOLN
INTRAMUSCULAR | Status: AC
  Administered 2016-05-31: 11:00:00
  Filled 2016-05-31: qty 10

## 2016-05-31 MED ORDER — METHYLPREDNISOLONE ACETATE 80 MG/ML IJ SUSP
80.0000 mg | Freq: Once | INTRAMUSCULAR | Status: DC
  Filled 2016-05-31: qty 1

## 2016-05-31 MED ORDER — METHYLPREDNISOLONE ACETATE 80 MG/ML IJ SUSP
INTRAMUSCULAR | Status: AC
  Administered 2016-05-31: 11:00:00
  Filled 2016-05-31: qty 1

## 2016-05-31 MED ORDER — LIDOCAINE HCL (PF) 1 % IJ SOLN: 10.0000 mL | Freq: Once | INTRAMUSCULAR | Status: DC

## 2016-05-31 NOTE — Patient Instructions (Signed)
Knee Injection A knee injection is a procedure to get medicine into your knee joint. Your health care provider puts a needle into the joint and injects medicine with an attached syringe. The injected medicine may relieve the pain, swelling, and stiffness of arthritis. The injected medicine may also help to lubricate and cushion your knee joint. You may need more than one injection. LET [facility] CARE PROVIDER KNOW ABOUT:  Any allergies you have.  All medicines you are taking, including vitamins, herbs, eye drops, creams, and over-the-counter medicines.  Previous problems you or members of your family have had with the use of anesthetics.  Any blood disorders you have.  Previous surgeries you have had.  Any medical conditions you may have. RISKS AND COMPLICATIONS Generally, this is a safe procedure. However, problems may occur, including:  Infection.  Bleeding.  Worsening symptoms.  Damage to the area around your knee.  Allergic reaction to any of the medicines.  Skin reactions from repeated injections. BEFORE THE PROCEDURE  Ask your health care provider about changing or stopping your regular medicines. This is especially important if you are taking diabetes medicines or blood thinners.  Plan to have someone take you home after the procedure. PROCEDURE  You will sit or lie down in a position for your knee to be treated.  The skin over your kneecap will be cleaned with a germ-killing solution (antiseptic).  You will be given a medicine that numbs the area (local anesthetic). You may feel some stinging.  After your knee becomes numb, you will have a second injection. This is the medicine. This needle is carefully placed between your kneecap and your knee. The medicine is injected into the joint space.  At the end of the procedure, the needle will be removed.  A bandage (dressing) may be placed over the injection site. The procedure may vary among health care providers  and hospitals. AFTER THE PROCEDURE  You may have to move your knee through its full range of motion. This helps to get all of the medicine into your joint space.  Your blood pressure, heart rate, breathing rate, and blood oxygen level will be monitored often until the medicines you were given have worn off.  You will be watched to make sure that you do not have a reaction to the injected medicine.   This information is not intended to replace advice given to you by your health care provider. Make sure you discuss any questions you have with your health care provider.   Document Released: 02/19/2007 Document Revised: 12/19/2014 Document Reviewed: 10/08/2014 [employer] Interactive Patient Education 2016 [employer] Pain Management Discharge Instructions  General Discharge Instructions :  If you need to reach your doctor call: Monday-Friday 8:00 am - 4:00 pm at [phone] or toll free [phone].  After clinic hours [phone] to have operator reach doctor.  Bring all of your medication bottles to all your appointments in the pain clinic.  To cancel or reschedule your appointment with Pain Management please remember to call 24 hours in advance to avoid a fee.  Refer to the educational materials which you have been given on: General Risks, I had my Procedure. Discharge Instructions, Post Sedation.  Post Procedure Instructions:  The drugs you were given will stay in your system until tomorrow, so for the next 24 hours you should not drive, make any legal decisions or drink any alcoholic beverages.  You may eat anything you prefer, but it is better to start with liquids then  soups and crackers, and gradually work up to solid foods.  Please notify your doctor immediately if you have any unusual bleeding, trouble breathing or pain that is not related to your normal pain.  Depending on the type of procedure that was done, some parts of your body may feel week and/or numb.  This  usually clears up by tonight or the next day.  Walk with the use of an assistive device or accompanied by an adult for the 24 hours.  You may use ice on the affected area for the first 24 hours.  Put ice in a Ziploc bag and cover with a towel and place against area 15 minutes on 15 minutes off.  You may switch to heat after 24 hours.

## 2016-05-31 NOTE — Progress Notes (Signed)
Patient's Name: [facility]  Patient type: Established  MRN: [id number]  Service setting: Ambulatory outpatient  DOB: December 18, 1940  Location: [facility]  DOS: 05/31/2016  Primary Care Physician: Rubbie Battiest, NP  Note by: Kathlen Brunswick. Dossie Arbour, M.D, DABA, DABAPM, DABPM, DABIPP, FIPP  Referring Physician: Rubbie Battiest, NP  Specialty: Board-Certified Interventional Pain Management  Last Visit to Pain Management: 05/26/2016   Procedure Note: Left intra-articular Knee injection (Local + Steroid). Primary Reason(s) for Visit: Encounter for post-procedure evaluation of chronic illness with mild to moderate exacerbation CC: Knee Pain   HPI  Ms. Alcindor is a 75 y.o. year old, female patient, who returns today as an established patient. She has Varicose veins of both lower extremities; Encounter to establish care; Essential hypertension; Rotator cuff tear (Left); URI (upper respiratory infection); Chronic knee pain (Left); Chronic knee osteoarthritis (Left); Chronic pain; Arthritis of knee (Left); Arthropathy of knee (Left); Knee derangement (Left); Chronic low back pain; Chronic neck pain; Cervical spondylosis; Lumbar spondylosis; History of head injury; Osteoporosis; Atherosclerotic peripheral vascular disease ([place]); Right leg swelling; and Special screening for malignant neoplasms, colon on her problem list.. Her primarily concern today is the Knee Pain   Pain Assessment: Self-Reported Pain Score: 7/10  Reported level is compatible with observation       Pain Type: Chronic pain Pain Location: Knee Pain Orientation: Left Pain Descriptors / Indicators: Sharp, Shooting Pain Frequency: Intermittent  The patient comes into the clinics today for post-procedure evaluation on the interventional treatment done on 11/24/2015. The patient is here requesting a repeat left intra-articular steroid injection of her knee. Since the last one didn't work for her well for her we will  go ahead and repeat it. Today we cord the following topics: 1. Working on bringing her BMI below 30. 2. Limiting the local anesthetic and steroid injections of any joint to no more than 2 per year. 3. Looking at the possibility of doing a series of 5 Hyalgan knee injections (viscosupplementation therapy). 4. The patient asked my opinion about "Flexogenix" therapies. I looked at their website and covert the material that they have they're to offer. 5. We looked at the alternative of diagnostic genicular nerve blocks with possible follow-up using radiofrequency ablation.   Date of Last Visit: 11/24/15 Service Provided on Last Visit: Procedure (left knee injection)  Post-Procedure Assessment  Procedure done on last visit: Left intra-articular knee injection without fluoroscopy or IV sedation using local anesthetic and steroids. Done on 11/24/2015. Side-effects or Adverse reactions: None reported Sedation: No sedation used  Results: Ultra-Short Term Relief (First 1 hour after procedure): 100 %  Analgesia during this period is likely to be Local Anesthetic and/or IV Sedative (Analgesic/Anxiolitic) related Short Term Relief (Initial 4-6 hrs after procedure): 100 % Complete relief confirms area to be the source of pain Long Term Relief : 90 % Long-term benefit would suggest an inflammatory etiology to the pain  the patient indicates that it provided her relief of the pain that lasted for at least 4 months. She indicates that around April she began to feel some of the discomfort coming back. At this point is hurting again and she comes in to see what the alternatives are. Current Relief (Now): 25% according to the patient is still better than it was before but the pain is almost ready coming back. Recurrance of pain could suggest persistent aggravating factors. Today we spoke about her BMI and how she needs to bring it down to less  than 30. Interpretation of Results: Clearly this therapy continues to  be effective for her as long as she doesn't develop any major intra-articular pathology. For the time being it would seem that it is primarily inflammatory. The patient has been warned about doing more than 2 steroid injections in any large joint per year due to the theory that it may actually speed up the degeneration of the cartilage.  Laboratory Chemistry  Inflammation Markers No results found for: ESRSEDRATE, CRP  Renal Function Lab Results  Component Value Date   BUN 17 02/20/2015   CREATININE 1.00 03/05/2015   GFRAA >60 03/05/2015   GFRNONAA 56* 03/05/2015    Hepatic Function Lab Results  Component Value Date   AST 19 02/20/2015   ALT 26 02/20/2015   ALBUMIN 4.2 02/20/2015    Electrolytes Lab Results  Component Value Date   NA 136 02/20/2015   K 4.5 02/20/2015   CL 100 02/20/2015   CALCIUM 9.7 02/20/2015    Pain Modulating Vitamins No results found for: VD25OH, VD125OH2TOT, BW4665LD3, TT0177LT9, VITAMINB12  Coagulation Parameters Lab Results  Component Value Date   PLT 191.0 02/20/2015    Note: Labs Reviewed.  Recent Diagnostic Imaging  Dg Tibia/fibula Right  02/03/2016  CLINICAL DATA:  Injury.  Swelling and pain. EXAM: RIGHT TIBIA AND FIBULA - 2 VIEW COMPARISON:  No recent. FINDINGS: Diffuse soft tissue swelling. No acute bony joint abnormality identified. No evidence of fracture or dislocation. Degenerative changes right knee and ankle. Peripheral vascular calcification. IMPRESSION: 1. Diffuse soft tissue swelling. Degenerative changes right knee and ankle. No acute bony abnormality. 2.  Peripheral vascular disease. Electronically Signed   By: Marcello Moores  Register   On: 02/03/2016 14:48   Dg Ankle Complete Right  02/03/2016  CLINICAL DATA:  Injury 2 weeks ago to right lower extremity, fell on February 9th with significant swelling and pain remaining anterior and lateral ankle EXAM: RIGHT ANKLE - COMPLETE 3+ VIEW COMPARISON:  None. FINDINGS: Small bone fragments off  the tip of the lateral malleolus appear corticated. The do not appear to be of acute origin. The mortise is intact. No other osseous abnormalities. No significant soft tissue swelling or joint effusion. IMPRESSION: No acute abnormalities Electronically Signed   By: Skipper Cliche M.D.   On: 02/03/2016 14:45   US Venous Img Lower Unilateral Right  02/03/2016  CLINICAL DATA:  Right lower extremity pain and edema following trauma 2 weeks prior EXAM: RIGHT LOWER EXTREMITY VENOUS DUPLEX ULTRASOUND TECHNIQUE: Gray-scale sonography with graded compression, as well as color Doppler and duplex ultrasound were performed to evaluate the right lower extremity deep venous system from the level of the common femoral vein and including the common femoral, femoral, profunda femoral, popliteal and calf veins including the posterior tibial, peroneal and gastrocnemius veins when visible. The superficial great saphenous vein was also interrogated. Spectral Doppler was utilized to evaluate flow at rest and with distal augmentation maneuvers in the common femoral, femoral and popliteal veins. COMPARISON:  December 02, 2005 FINDINGS: Contralateral Common Femoral Vein: Respiratory phasicity is normal and symmetric with the symptomatic side. No evidence of thrombus. Normal compressibility. Common Femoral Vein: No evidence of thrombus. Normal compressibility, respiratory phasicity and response to augmentation. Saphenofemoral Junction: No evidence of thrombus. Normal compressibility and flow on color Doppler imaging. Profunda Femoral Vein: No evidence of thrombus. Normal compressibility and flow on color Doppler imaging. Femoral Vein: No evidence of thrombus. Normal compressibility, respiratory phasicity and response to augmentation. Popliteal Vein: No evidence of  thrombus. Normal compressibility, respiratory phasicity and response to augmentation. Calf Veins: No evidence of thrombus. Normal compressibility and flow on color Doppler  imaging. Superficial Great Saphenous Vein: No evidence of thrombus. Normal compressibility and flow on color Doppler imaging. Venous Reflux:  None. Other Findings:  None. IMPRESSION: No evidence of right lower extremity deep venous thrombosis. Left common femoral vein also patent. Electronically Signed   By: Lowella Grip III M.D.   On: 02/03/2016 13:52    Meds  The patient has a current medication list which includes the following prescription(s): aspirin, calcium carbonate, cholecalciferol, ferrous sulfate, hydrochlorothiazide, multivitamin, and omeprazole, and the following Facility-Administered Medications: lidocaine (pf), methylprednisolone acetate, and ropivacaine (pf) 2 mg/ml (0.2%).  Current Outpatient Prescriptions on File Prior to Visit  Medication Sig  . aspirin 81 MG tablet Take 81 mg by mouth daily.  . Calcium Carbonate (CALTRATE 600 PO) Take 1 tablet by mouth daily.  . Cholecalciferol (D3 ADULT PO) Take 1 tablet by mouth daily.  . ferrous sulfate 325 (65 FE) MG EC tablet Take 325 mg by mouth daily with breakfast.  . hydrochlorothiazide (HYDRODIURIL) 12.5 MG tablet TAKE ONE TABLET BY MOUTH ONCE DAILY  . Multiple Vitamin (MULTIVITAMIN) capsule Take 1 capsule by mouth daily.  Marland Kitchen omeprazole (PRILOSEC) 20 MG capsule Take 20 mg by mouth daily.   No current facility-administered medications on file prior to visit.    ROS  Constitutional: Denies any fever or chills Gastrointestinal: No reported hemesis, hematochezia, vomiting, or acute GI distress Musculoskeletal: Denies any acute onset joint swelling, redness, loss of ROM, or weakness Neurological: No reported episodes of acute onset apraxia, aphasia, dysarthria, agnosia, amnesia, paralysis, loss of coordination, or loss of consciousness  Allergies  Ms. Evans is allergic to penicillins.  [place]  Medical:  Ms. Alviar  has a past medical history of Phlebitis (2014) and Rotator cuff injury (12/2013). Family: family history  includes Varicose Veins in her brother. Surgical:  has past surgical history that includes vein closure (Bilateral, Feb 2008); Abdominal hysterectomy (1981); and Shoulder surgery (Left, 03/03/15). Tobacco:  reports that she quit smoking about 22 years ago. She has never used smokeless tobacco. Alcohol:  reports that she drinks about 4.2 oz of alcohol per week. Drug:  reports that she does not use illicit drugs.  Constitutional Exam  Vitals: Blood pressure 135/86, pulse 87, temperature 97.7 F (36.5 C), resp. rate 18, height '5\' 5"'$  (1.651 m), weight 220 lb (99.791 kg), SpO2 98 %. General appearance: Well nourished, well developed, and well hydrated. In no acute distress Calculated BMI/Body habitus: Body mass index is 36.61 kg/(m^2). (35-39.9 kg/m2) Severe obesity (Class II) - 136% higher incidence of chronic pain. Patient instructed to bring her BMI down below 30. Psych/Mental status: Alert and oriented x 3 (person, place, & time) Eyes: PERLA Respiratory: No evidence of acute respiratory distress  Cervical Spine Exam  Inspection: No masses, redness, or swelling Alignment: Symmetrical ROM: Functional: ROM is within functional limits [facility]) Stability: No instability detected Muscle strength & Tone: Functionally intact Sensory: Unimpaired Palpation: No complaints of tenderness  Upper Extremity (UE) Exam    Side: Right upper extremity  Side: Left upper extremity  Inspection: No masses, redness, swelling, or asymmetry  Inspection: No masses, redness, swelling, or asymmetry  ROM:  ROM:  Functional: ROM is within functional limits [facility])  Functional: ROM is within functional limits [facility])  Muscle strength & Tone: Functionally intact  Muscle strength & Tone: Functionally intact  Sensory: Unimpaired  Sensory: Unimpaired  Palpation: Non-contributory  Palpation: Non-contributory   Thoracic Spine Exam  Inspection: No masses, redness, or swelling Alignment: Symmetrical ROM: Functional: ROM is  within functional limits [facility]) Stability: No instability detected Sensory: Unimpaired Muscle strength & Tone: Functionally intact Palpation: No complaints of tenderness  Lumbar Spine Exam  Inspection: No masses, redness, or swelling Alignment: Symmetrical ROM: Functional: ROM is within functional limits [facility]) Stability: No instability detected Muscle strength & Tone: Functionally intact Sensory: Unimpaired Palpation: No complaints of tenderness Provocative Tests: Lumbar Hyperextension and rotation test: deferred Patrick's Maneuver: deferred  Gait & Posture Assessment  Ambulation: Unassisted Gait: Unaffected Posture: WNL  Lower Extremity Exam    Side: Right lower extremity  Side: Left lower extremity  Inspection: No masses, redness, swelling, or asymmetry ROM:  Inspection: No masses, redness, swelling, or asymmetry ROM:  Functional: ROM is within functional limits [facility])  Functional: ROM is within functional limits [facility])  Muscle strength & Tone: Functionally intact  Muscle strength & Tone: Functionally intact  Sensory: Unimpaired  Sensory: Unimpaired  Palpation: Non-contributory  Palpation: Non-contributory   Post-operative Assessment:   Complications: No immediate post-treatment complications were observed. Disposition: Return to clinic for follow-up evaluation. The patient tolerated the entire procedure well. A repeat set of vitals were taken after the procedure and the patient was kept under observation following institutional policy, for this type of procedure. The patient was discharged home, once institutional criteria were met. The patient was provided with post-procedure discharge instructions, including a section on how to identify potential problems. Should any problems arise concerning this procedure, the patient was given instructions to immediately contact us, at any time, without hesitation. In any case, we plan to contact the patient by telephone for a follow-up  status report regarding this interventional procedure. Comments:  No additional relevant information.  Medications administered during this visit: We administered ropivacaine (PF) 2 mg/ml (0.2%), lidocaine (PF), and methylPREDNISolone acetate.  Assessment & Plan  Primary Diagnosis & Pertinent Problem List: The primary encounter diagnosis was Primary osteoarthritis of left knee. A diagnosis of Chronic knee pain (Left) was also pertinent to this visit.  Visit Diagnosis: 1. Primary osteoarthritis of left knee   2. Chronic knee pain (Left)     Problem-specific Plan(s): No problem-specific assessment & plan notes found for this encounter.   Plan of Care   Problem List Items Addressed This Visit      High   Chronic knee osteoarthritis (Left) - Primary (Chronic)   Relevant Medications   methylPREDNISolone acetate (DEPO-MEDROL) injection 80 mg   ropivacaine (PF) 2 mg/ml (0.2%) (NAROPIN) epidural 4 mL   lidocaine (PF) (XYLOCAINE) 1 % injection 10 mL   methylPREDNISolone acetate (DEPO-MEDROL) 80 MG/ML injection (Completed)   Other Relevant Orders   KNEE INJECTION   GENICULAR NERVE BLOCK   KNEE INJECTION   Chronic knee pain (Left) (Chronic)   Relevant Medications   methylPREDNISolone acetate (DEPO-MEDROL) injection 80 mg   ropivacaine (PF) 2 mg/ml (0.2%) (NAROPIN) epidural 4 mL   lidocaine (PF) (XYLOCAINE) 1 % injection 10 mL   ropivacaine (PF) 2 mg/ml (0.2%) (NAROPIN) 2 MG/ML epidural (Completed)   lidocaine (PF) (XYLOCAINE) 1 % injection (Completed)   methylPREDNISolone acetate (DEPO-MEDROL) 80 MG/ML injection (Completed)   Other Relevant Orders   GENICULAR NERVE BLOCK   KNEE INJECTION       Pharmacotherapy (Medications Ordered): Meds ordered this encounter  Medications  . methylPREDNISolone acetate (DEPO-MEDROL) injection 80 mg    Sig:   . ropivacaine (PF) 2 mg/ml (0.2%) (  NAROPIN) epidural 4 mL    Sig:   . lidocaine (PF) (XYLOCAINE) 1 % injection 10 mL    Sig:   .  ropivacaine (PF) 2 mg/ml (0.2%) (NAROPIN) 2 MG/ML epidural    Sig:     Hensley, Norma: cabinet override  . lidocaine (PF) (XYLOCAINE) 1 % injection    Sig:     Hensley, Norma: cabinet override  . methylPREDNISolone acetate (DEPO-MEDROL) 80 MG/ML injection    Sig:     Hensley, Norma: cabinet override    [employer] & Procedure Ordered: Orders Placed This Encounter  Procedures  . KNEE INJECTION    Scheduling Instructions:     Side: Left-sided (local anesthetic plus steroids)     Sedation: No Sedation.     Timeframe: Today    Order Specific Question:  Where will this procedure be performed?    Answer:  [facility]  . GENICULAR NERVE BLOCK    For knee pain.    Standing Status: Standing     Number of Occurrences: 1     Standing Expiration Date: 05/31/2017    Scheduling Instructions:     Side(s): Left Knee     Level(s): Superior-Lateral, Superior-Medial, and Inferior-Medial Genicular Nerves     Sedation: With Sedation.     Timeframe: PRN. The patient will call when ready to proceed.    Order Specific Question:  Where will this procedure be performed?    Answer:  [facility]  . KNEE INJECTION    Please order Hyalgan.    Standing Status: Future     Number of Occurrences:      Standing Expiration Date: 05/31/2017    Scheduling Instructions:     Side: Left-sided (Hyalgan) #1     Sedation: No Sedation.     Timeframe: PRN Procedure. Patient will call.    Order Specific Question:  Where will this procedure be performed?    Answer:  [facility]    Imaging Ordered: None  Interventional Therapies: Scheduled:  Palliative Left intra-articular knee injection w/o fluoroscopy or sedation.(Today)    Considering:   1. Series of 5 left-sided intra-articular Hyalgan knee injections, without fluoroscopy or IV sedation.  2. Diagnostic left sided genicular nerve block under fluoroscopic guidance, with or without sedation.  3. Possible left sided genicular nerve  radiofrequency ablation under fluoroscopic guidance and IV sedation the pending on the results of the diagnostic injection.    PRN Procedures:   1. Series of 5 left-sided intra-articular Hyalgan knee injections, without fluoroscopy or IV sedation.  2. Diagnostic left sided genicular nerve block under fluoroscopic guidance, with or without sedation.   Referral(s) or Consult(s): None at this time.  Medications administered during this visit: We administered ropivacaine (PF) 2 mg/ml (0.2%), lidocaine (PF), and methylPREDNISolone acetate.  Requested PM Follow-up: Return for Procedure (PRN - Patient will call), Post-Procedure Eval (2 weeks).  No future appointments.  Primary Care Physician: Carollee Leitz, NP Location: [facility] Note by: Sydnee Levans. Laban Emperor, M.D, DABA, DABAPM, DABPM, DABIPP, FIPP  Pain Score Disclaimer: We use the NRS-11 scale. This is a self-reported, subjective measurement of pain severity with only modest accuracy. It is used primarily to identify changes within a particular patient. It must be understood that outpatient pain scales are significantly less accurate that those used for research, where they can be applied under ideal controlled circumstances with minimal exposure to variables. In reality, the score is likely to be a combination of pain intensity  and pain affect, where pain affect describes the degree of emotional arousal or changes in action readiness caused by the sensory experience of pain. Factors such as social and work situation, setting, emotional state, anxiety levels, expectation, and prior pain experience may influence pain perception and show large inter-individual differences that may also be affected by time variables.  Patient instructions provided during this appointment: Patient Instructions  Knee Injection A knee injection is a procedure to get medicine into your knee joint. Your health care provider puts a needle  into the joint and injects medicine with an attached syringe. The injected medicine may relieve the pain, swelling, and stiffness of arthritis. The injected medicine may also help to lubricate and cushion your knee joint. You may need more than one injection. LET [facility] CARE PROVIDER KNOW ABOUT:  Any allergies you have.  All medicines you are taking, including vitamins, herbs, eye drops, creams, and over-the-counter medicines.  Previous problems you or members of your family have had with the use of anesthetics.  Any blood disorders you have.  Previous surgeries you have had.  Any medical conditions you may have. RISKS AND COMPLICATIONS Generally, this is a safe procedure. However, problems may occur, including:  Infection.  Bleeding.  Worsening symptoms.  Damage to the area around your knee.  Allergic reaction to any of the medicines.  Skin reactions from repeated injections. BEFORE THE PROCEDURE  Ask your health care provider about changing or stopping your regular medicines. This is especially important if you are taking diabetes medicines or blood thinners.  Plan to have someone take you home after the procedure. PROCEDURE  You will sit or lie down in a position for your knee to be treated.  The skin over your kneecap will be cleaned with a germ-killing solution (antiseptic).  You will be given a medicine that numbs the area (local anesthetic). You may feel some stinging.  After your knee becomes numb, you will have a second injection. This is the medicine. This needle is carefully placed between your kneecap and your knee. The medicine is injected into the joint space.  At the end of the procedure, the needle will be removed.  A bandage (dressing) may be placed over the injection site. The procedure may vary among health care providers and hospitals. AFTER THE PROCEDURE  You may have to move your knee through its full range of motion. This helps to get all  of the medicine into your joint space.  Your blood pressure, heart rate, breathing rate, and blood oxygen level will be monitored often until the medicines you were given have worn off.  You will be watched to make sure that you do not have a reaction to the injected medicine.   This information is not intended to replace advice given to you by your health care provider. Make sure you discuss any questions you have with your health care provider.   Document Released: 02/19/2007 Document Revised: 12/19/2014 Document Reviewed: 10/08/2014 Elsevier Interactive Patient Education 2016 [employer] Pain Management Discharge Instructions  General Discharge Instructions :  If you need to reach your doctor call: Monday-Friday 8:00 am - 4:00 pm at [phone] or toll free [phone].  After clinic hours [phone] to have operator reach doctor.  Bring all of your medication bottles to all your appointments in the pain clinic.  To cancel or reschedule your appointment with Pain Management please remember to call 24 hours in advance to avoid a fee.  Refer to the educational  materials which you have been given on: General Risks, I had my Procedure. Discharge Instructions, Post Sedation.  Post Procedure Instructions:  The drugs you were given will stay in your system until tomorrow, so for the next 24 hours you should not drive, make any legal decisions or drink any alcoholic beverages.  You may eat anything you prefer, but it is better to start with liquids then soups and crackers, and gradually work up to solid foods.  Please notify your doctor immediately if you have any unusual bleeding, trouble breathing or pain that is not related to your normal pain.  Depending on the type of procedure that was done, some parts of your body may feel week and/or numb.  This usually clears up by tonight or the next day.  Walk with the use of an assistive device or accompanied by an adult for the 24  hours.  You may use ice on the affected area for the first 24 hours.  Put ice in a Ziploc bag and cover with a towel and place against area 15 minutes on 15 minutes off.  You may switch to heat after 24 hours.

## 2016-05-31 NOTE — Progress Notes (Signed)
Safety precautions to be maintained throughout the outpatient stay will include: orient to surroundings, keep bed in low position, maintain call bell within reach at all times, provide assistance with transfer out of bed and ambulation.  

## 2016-06-30 DIAGNOSIS — M25512 Pain in left shoulder: Secondary | ICD-10-CM | POA: Diagnosis not present

## 2016-09-04 ENCOUNTER — Other Ambulatory Visit: Payer: Self-pay | Admitting: Nurse Practitioner

## 2016-09-04 DIAGNOSIS — I10 Essential (primary) hypertension: Secondary | ICD-10-CM

## 2016-09-05 NOTE — Telephone Encounter (Signed)
Please set her up with ES or JC or MA. She has not had cr checked in over a year so no refills until she has a cmet

## 2016-09-05 NOTE — Telephone Encounter (Signed)
Refilled 06/16/15. Last appointment 02/25/16. Pt has no future appointment with a new PCP. Please advise?

## 2016-09-06 NOTE — Telephone Encounter (Signed)
Pt schedule for appt on 09/22/16 and labs on 09/08/16 at 3 p.m.

## 2016-09-06 NOTE — Telephone Encounter (Signed)
Order placed

## 2016-09-08 ENCOUNTER — Other Ambulatory Visit (INDEPENDENT_AMBULATORY_CARE_PROVIDER_SITE_OTHER): Payer: Medicare Other

## 2016-09-08 DIAGNOSIS — I10 Essential (primary) hypertension: Secondary | ICD-10-CM | POA: Diagnosis not present

## 2016-09-08 LAB — COMPREHENSIVE METABOLIC PANEL
ALK PHOS: 65 U/L (ref 39–117)
ALT: 19 U/L (ref 0–35)
AST: 18 U/L (ref 0–37)
Albumin: 3.9 g/dL (ref 3.5–5.2)
BILIRUBIN TOTAL: 0.4 mg/dL (ref 0.2–1.2)
BUN: 23 mg/dL (ref 6–23)
CO2: 32 meq/L (ref 19–32)
Calcium: 9.4 mg/dL (ref 8.4–10.5)
Chloride: 101 mEq/L (ref 96–112)
Creatinine, Ser: 0.87 mg/dL (ref 0.40–1.20)
GFR: 67.65 mL/min (ref >60.00)
GLUCOSE: 103 mg/dL — AB (ref 70–99)
POTASSIUM: 4.4 meq/L (ref 3.5–5.1)
SODIUM: 139 meq/L (ref 135–145)
TOTAL PROTEIN: 7.2 g/dL (ref 6.0–8.3)

## 2016-09-09 ENCOUNTER — Telehealth: Payer: Self-pay | Admitting: Nurse Practitioner

## 2016-09-09 MED ORDER — HYDROCHLOROTHIAZIDE 12.5 MG PO TABS: 12.5000 mg | ORAL_TABLET | Freq: Every day | ORAL | 3 refills | Status: DC

## 2016-09-09 NOTE — Telephone Encounter (Signed)
Sent to pharmacy 

## 2016-09-09 NOTE — Telephone Encounter (Signed)
Pt called asking when her bp medication can be filled, she came in for lab work yesterday. Medication is hydrochlorothiazide (HYDRODIURIL) 12.5 MG tablet . She has not had any in a week. Thank you!  [place], [place]  Call pt @ [phone]

## 2016-09-09 NOTE — Telephone Encounter (Signed)
Patient has had CMET ok to refill HCTZ?

## 2016-09-22 ENCOUNTER — Ambulatory Visit (INDEPENDENT_AMBULATORY_CARE_PROVIDER_SITE_OTHER): Payer: Medicare Other | Admitting: Family Medicine

## 2016-09-22 ENCOUNTER — Encounter: Payer: Self-pay | Admitting: Family Medicine

## 2016-09-22 DIAGNOSIS — I8393 Asymptomatic varicose veins of bilateral lower extremities: Secondary | ICD-10-CM

## 2016-09-22 DIAGNOSIS — K219 Gastro-esophageal reflux disease without esophagitis: Secondary | ICD-10-CM

## 2016-09-22 DIAGNOSIS — I10 Essential (primary) hypertension: Secondary | ICD-10-CM

## 2016-09-22 NOTE — Progress Notes (Signed)
  Ann Rumps, MD Phone: [phone]  TRESA SCOGIN is a 75 y.o. female who presents today for a follow up.  HYPERTENSION  Disease Monitoring  Home BP Monitoring not checking Chest pain- no    Dyspnea- no Medications  Compliance-  ran out of her HCTZ about a month ago. Lightheadedness-  no  Edema- notes chronic varicose veins managed and followed by general surgery, notes this is stable  GERD: Patient's on Prilosec. No symptoms. No blood in her stool. No abdominal pain. Has been on Prilosec for 5-6 years. No history of EGD.  Varicose veins: Sees Dr. Jamal Collin. Has occasional stasis dermatitis with this. They typically wrap them when this occurs. No issues currently.   PMH: Former smoker   ROS see history of present illness  Objective  Physical Exam Vitals:   09/22/16 1601  BP: 138/86  Pulse: 77  Temp: 98.4 F (36.9 C)    BP Readings from Last 3 Encounters:  09/22/16 138/86  05/31/16 135/86  02/25/16 116/80   Wt Readings from Last 3 Encounters:  09/22/16 227 lb (103 kg)  05/31/16 220 lb (99.8 kg)  02/25/16 221 lb (100.2 kg)    Physical Exam  Constitutional: She is well-developed, well-nourished, and in no distress.  Cardiovascular: Normal rate, regular rhythm and normal heart sounds.   Pulmonary/Chest: Effort normal and breath sounds normal.  Musculoskeletal: She exhibits no edema.  Neurological: She is alert. Gait normal.  Skin: Skin is warm and dry.     Assessment/Plan: Please see individual problem list.  Essential hypertension At goal with no medication. We will have her check her blood pressure daily for the next 2 weeks and call us with the results. If well-controlled we will not continue medications. If greater than 150/90 persistently we will start back on HCTZ.  GERD (gastroesophageal reflux disease) Patient asymptomatic at this time. We'll have her stop her Prilosec after discussion of the risks of this medication long-term. If symptoms recur  she will let us know and we will refer to GI for EGD.  Varicose veins of both lower extremities Sees Dr. Jamal Collin for this. She'll continue to follow with him.   Ann Rumps, MD [place]

## 2016-09-22 NOTE — Assessment & Plan Note (Signed)
Patient asymptomatic at this time. We'll have her stop her Prilosec after discussion of the risks of this medication long-term. If symptoms recur she will let us know and we will refer to GI for EGD.

## 2016-09-22 NOTE — Assessment & Plan Note (Signed)
Sees Dr. Jamal Collin for this. She'll continue to follow with him.

## 2016-09-22 NOTE — Patient Instructions (Signed)
Nice to see you. Your blood pressure is fantastic. Please buy a blood pressure monitor and check this at home. Your goal is less than 150/90. Call us in 2 weeks and let us know what your pressures are doing. Please stop the Prilosec. When you call us in 2 weeks' please let us know if your symptoms have recurred.

## 2016-09-22 NOTE — Assessment & Plan Note (Signed)
At goal with no medication. We will have her check her blood pressure daily for the next 2 weeks and call us with the results. If well-controlled we will not continue medications. If greater than 150/90 persistently we will start back on HCTZ.

## 2016-09-22 NOTE — Progress Notes (Signed)
Pre visit review using our clinic review tool, if applicable. No additional management support is needed unless otherwise documented below in the visit note. 

## 2016-09-29 ENCOUNTER — Ambulatory Visit: Payer: Medicare Other | Attending: Pain Medicine | Admitting: Pain Medicine

## 2016-09-29 ENCOUNTER — Encounter: Payer: Self-pay | Admitting: Pain Medicine

## 2016-09-29 ENCOUNTER — Ambulatory Visit
Admit: 2016-09-29 | Discharge: 2016-09-29 | Disposition: A | Discharge status: Home / Self Care | Payer: Medicare Other | Source: Other Acute Inpatient Hospital | Attending: Pain Medicine | Admitting: Pain Medicine

## 2016-09-29 ENCOUNTER — Ambulatory Visit
Admission: RE | Admit: 2016-09-29 | Discharge: 2016-09-29 | Disposition: A | Discharge status: Home / Self Care | Payer: Medicare Other | Source: Ambulatory Visit | Attending: Pain Medicine | Admitting: Pain Medicine

## 2016-09-29 VITALS — BP 154/81 | HR 77 | Temp 97.9°F | Resp 16 | Ht 64.0 in | Wt 226.0 lb

## 2016-09-29 DIAGNOSIS — M75102 Unspecified rotator cuff tear or rupture of left shoulder, not specified as traumatic: Secondary | ICD-10-CM | POA: Insufficient documentation

## 2016-09-29 DIAGNOSIS — Z87891 Personal history of nicotine dependence: Secondary | ICD-10-CM | POA: Insufficient documentation

## 2016-09-29 DIAGNOSIS — M25562 Pain in left knee: Secondary | ICD-10-CM

## 2016-09-29 DIAGNOSIS — Z880 Allergy status to penicillin: Secondary | ICD-10-CM | POA: Insufficient documentation

## 2016-09-29 DIAGNOSIS — M25561 Pain in right knee: Secondary | ICD-10-CM | POA: Insufficient documentation

## 2016-09-29 DIAGNOSIS — G8929 Other chronic pain: Secondary | ICD-10-CM | POA: Diagnosis not present

## 2016-09-29 DIAGNOSIS — Z79899 Other long term (current) drug therapy: Secondary | ICD-10-CM | POA: Insufficient documentation

## 2016-09-29 DIAGNOSIS — M1712 Unilateral primary osteoarthritis, left knee: Secondary | ICD-10-CM

## 2016-09-29 DIAGNOSIS — I70209 Unspecified atherosclerosis of native arteries of extremities, unspecified extremity: Secondary | ICD-10-CM | POA: Diagnosis not present

## 2016-09-29 DIAGNOSIS — Z9889 Other specified postprocedural states: Secondary | ICD-10-CM | POA: Diagnosis not present

## 2016-09-29 DIAGNOSIS — Z7982 Long term (current) use of aspirin: Secondary | ICD-10-CM | POA: Diagnosis not present

## 2016-09-29 DIAGNOSIS — M545 Low back pain: Secondary | ICD-10-CM | POA: Insufficient documentation

## 2016-09-29 DIAGNOSIS — M129 Arthropathy, unspecified: Secondary | ICD-10-CM | POA: Diagnosis not present

## 2016-09-29 DIAGNOSIS — M170 Bilateral primary osteoarthritis of knee: Secondary | ICD-10-CM | POA: Insufficient documentation

## 2016-09-29 DIAGNOSIS — I10 Essential (primary) hypertension: Secondary | ICD-10-CM | POA: Diagnosis not present

## 2016-09-29 DIAGNOSIS — M542 Cervicalgia: Secondary | ICD-10-CM | POA: Diagnosis not present

## 2016-09-29 DIAGNOSIS — M179 Osteoarthritis of knee, unspecified: Secondary | ICD-10-CM | POA: Diagnosis not present

## 2016-09-29 NOTE — Progress Notes (Signed)
Patient's Name: Ann Spencer  MRN: [id number]  Referring Provider: Rubbie Battiest, NP  DOB: 23-Nov-1941  PCP: Tommi Rumps, MD  DOS: 09/29/2016  Note by: Kathlen Brunswick. Dossie Arbour, MD  Service setting: Ambulatory outpatient  Specialty: Interventional Pain Management  Location: [facility] (AMB) Pain Management Facility    Patient type: Established   Primary Reason(s) for Visit: Encounter for post-procedure evaluation of chronic illness with mild to moderate exacerbation CC: Knee Pain (left)  HPI  [name] is a 75 y.o. year old, female patient, who comes today for an initial evaluation. She has Varicose veins of both lower extremities; Encounter to establish care; Essential hypertension; Rotator cuff tear (Left); URI (upper respiratory infection); Bilateral chronic knee pain (B) (L>R); Chronic knee osteoarthritis (Left); Chronic pain; Arthritis of knee (Left); Arthropathy of knee (Left); Knee derangement (Left); Chronic low back pain; Chronic neck pain; Cervical spondylosis; Lumbar spondylosis; History of head injury; Osteoporosis; Atherosclerotic peripheral vascular disease ([place]); Right leg swelling; Special screening for malignant neoplasms, colon; and GERD (gastroesophageal reflux disease) on her problem list.. Her primarily concern today is the Knee Pain (left)  Pain Assessment: Self-Reported Pain Score: 6 /10 Clinically the patient looks like a 4/10 Reported level is inconsistent with clinical observations. Information on the proper use of the pain score provided to the patient today. Pain Type: Chronic pain Pain Location: Knee Pain Orientation: Left Pain Descriptors / Indicators: Aching, Constant, Throbbing Pain Frequency: Constant  Ms. Spencer comes in today for post-procedure evaluation after the treatment done on 05/31/2016.  Post-Procedure Assessment  Procedure done on 05/31/2016: Left intra-articular Knee injection (Local + Steroid). Complications experienced at the time of the procedure:  None Side-effects or Adverse reactions: None reported Sedation: No sedation used. When no sedatives are used, the analgesic levels obtained are directly associated with the effectiveness of the local anesthetics. On the other hand, when sedation is provided, the level of analgesia obtained during the initial 1 hour, immediately following the intervention, is believed to be the result of a combination of factors. These factors may include, but are not limited to: 1. The effectiveness of the local anesthetics used. 2. The effects of the analgesic(s) and/or anxiolytic(s) used. 3. The degree of discomfort experienced by the patient at the time of the procedure. 4. The patients ability and reliability in recalling and recording the events. 5. The presence and influence of possible secondary gains. Results: Relief during the 1st hour after the procedure: 100 % (Ultra-Short Term Relief) Interpretative note: No IV Analgesics or Anxiolytics given, therefore the benefit is completely due to Local Anesthetics Local Anesthesia: Long-acting (4-6 hours) anesthetics used. The analgesic levels attained during this period are directly associated to the localized infiltration of local anesthetics and therefore cary significant diagnostic value as to the etiological location or origin of the pain. Results: Relief during the next 4 to 6 hour after the procedure: 70 % (Short Term Relief) Interpretative note: Complete relief confirms area to be the source of pain Long-Term Therapy: Steroids used. Results: Extended relief following procedure: 30 % (had some relief for a couple of weeks) Interpretative note: Long-term benefit would suggest an inflammatory etiology to the pain.         Long-Term Benefits:  Current Relief (Now): 0%  Interpretative note: Recurrance of symptoms after a period of improvement would suggest involvement of an inflammatory process with persistent aggravating factors Interpretation of Results: No  long-term benefit and therefore today we will move on to viscoelastic therapy.  Laboratory Chemistry  Inflammation Markers No results found for: ESRSEDRATE, CRP Renal Function Lab Results  Component Value Date   BUN 23 09/08/2016   CREATININE 0.87 09/08/2016   GFRAA >60 03/05/2015   GFRNONAA 56 (L) 03/05/2015   Hepatic Function Lab Results  Component Value Date   AST 18 09/08/2016   ALT 19 09/08/2016   ALBUMIN 3.9 09/08/2016   Electrolytes Lab Results  Component Value Date   NA 139 09/08/2016   K 4.4 09/08/2016   CL 101 09/08/2016   CALCIUM 9.4 09/08/2016   Pain Modulating Vitamins No results found for: Maralyn Sago [id number], [id number], [id number], 25OHVITD1, 25OHVITD2, 25OHVITD3, VITAMINB12 Coagulation Parameters Lab Results  Component Value Date   PLT 191.0 02/20/2015   Cardiovascular Lab Results  Component Value Date   BNP 51 02/14/2014   HGB 15.1 (H) 02/20/2015   HCT 44.2 02/20/2015   Note: Lab results reviewed.  Recent Diagnostic Imaging Review  Dg Tibia/fibula Right  Result Date: 02/03/2016 CLINICAL DATA:  Injury.  Swelling and pain. EXAM: RIGHT TIBIA AND FIBULA - 2 VIEW COMPARISON:  No recent. FINDINGS: Diffuse soft tissue swelling. No acute bony joint abnormality identified. No evidence of fracture or dislocation. Degenerative changes right knee and ankle. Peripheral vascular calcification. IMPRESSION: 1. Diffuse soft tissue swelling. Degenerative changes right knee and ankle. No acute bony abnormality. 2.  Peripheral vascular disease. Electronically Signed   By: Marcello Moores  Register   On: 02/03/2016 14:48   Dg Ankle Complete Right  Result Date: 02/03/2016 CLINICAL DATA:  Injury 2 weeks ago to right lower extremity, fell on February 9th with significant swelling and pain remaining anterior and lateral ankle EXAM: RIGHT ANKLE - COMPLETE 3+ VIEW COMPARISON:  None. FINDINGS: Small bone fragments off the tip of the lateral malleolus appear corticated. The do  not appear to be of acute origin. The mortise is intact. No other osseous abnormalities. No significant soft tissue swelling or joint effusion. IMPRESSION: No acute abnormalities Electronically Signed   By: Skipper Cliche M.D.   On: 02/03/2016 14:45   US Venous Img Lower Unilateral Right  Result Date: 02/03/2016 CLINICAL DATA:  Right lower extremity pain and edema following trauma 2 weeks prior EXAM: RIGHT LOWER EXTREMITY VENOUS DUPLEX ULTRASOUND TECHNIQUE: Gray-scale sonography with graded compression, as well as color Doppler and duplex ultrasound were performed to evaluate the right lower extremity deep venous system from the level of the common femoral vein and including the common femoral, femoral, profunda femoral, popliteal and calf veins including the posterior tibial, peroneal and gastrocnemius veins when visible. The superficial great saphenous vein was also interrogated. Spectral Doppler was utilized to evaluate flow at rest and with distal augmentation maneuvers in the common femoral, femoral and popliteal veins. COMPARISON:  December 02, 2005 FINDINGS: Contralateral Common Femoral Vein: Respiratory phasicity is normal and symmetric with the symptomatic side. No evidence of thrombus. Normal compressibility. Common Femoral Vein: No evidence of thrombus. Normal compressibility, respiratory phasicity and response to augmentation. Saphenofemoral Junction: No evidence of thrombus. Normal compressibility and flow on color Doppler imaging. Profunda Femoral Vein: No evidence of thrombus. Normal compressibility and flow on color Doppler imaging. Femoral Vein: No evidence of thrombus. Normal compressibility, respiratory phasicity and response to augmentation. Popliteal Vein: No evidence of thrombus. Normal compressibility, respiratory phasicity and response to augmentation. Calf Veins: No evidence of thrombus. Normal compressibility and flow on color Doppler imaging. Superficial Great Saphenous Vein: No  evidence of thrombus. Normal compressibility and flow on color Doppler imaging. Venous Reflux:  None. Other Findings:  None. IMPRESSION: No evidence of right lower extremity deep venous thrombosis. Left common femoral vein also patent. Electronically Signed   By: Lowella Grip III M.D.   On: 02/03/2016 13:52    Meds  The patient has a current medication list which includes the following prescription(s): aspirin, calcium carbonate, cholecalciferol, ferrous sulfate, and multivitamin, and the following Facility-Administered Medications: lidocaine (pf).  Current Outpatient Prescriptions on File Prior to Visit  Medication Sig  . aspirin 81 MG tablet Take 81 mg by mouth daily.  . Calcium Carbonate (CALTRATE 600 PO) Take 1 tablet by mouth daily.  . Cholecalciferol (D3 ADULT PO) Take 1 tablet by mouth daily.  . ferrous sulfate 325 (65 FE) MG EC tablet Take 325 mg by mouth daily with breakfast.  . Multiple Vitamin (MULTIVITAMIN) capsule Take 1 capsule by mouth daily.   No current facility-administered medications on file prior to visit.    ROS  Constitutional: Denies any fever or chills Gastrointestinal: No reported hemesis, hematochezia, vomiting, or acute GI distress Musculoskeletal: Denies any acute onset joint swelling, redness, loss of ROM, or weakness Neurological: No reported episodes of acute onset apraxia, aphasia, dysarthria, agnosia, amnesia, paralysis, loss of coordination, or loss of consciousness  Allergies  Ms. Gosling is allergic to penicillins.  PFSH  Drug: Ms. Prevo  reports that she does not use drugs. Alcohol:  reports that she drinks about 4.2 oz of alcohol per week . Tobacco:  reports that she quit smoking about 22 years ago. She quit after 30.00 years of use. She has never used smokeless tobacco. Medical:  has a past medical history of Allergy; Hypertension; Phlebitis (2014); and Rotator cuff injury (12/2013). Family: family history includes Varicose Veins in her  brother.  Past Surgical History:  Procedure Laterality Date  . ABDOMINAL HYSTERECTOMY  1981   Full  . SHOULDER SURGERY Left 03/03/15  . vein closure Bilateral Feb 2008   Constitutional Exam  General appearance: Well nourished, well developed, and well hydrated. In no apparent acute distress Vitals:   09/29/16 1442  BP: (!) 154/81  Pulse: 77  Resp: 16  Temp: 97.9 F (36.6 C)  TempSrc: Oral  SpO2: 96%  Weight: 226 lb (102.5 kg)  Height: 5\' 4"  (1.626 m)   BMI Assessment: Estimated body mass index is 38.79 kg/m as calculated from the following:   Height as of this encounter: 5\' 4"  (1.626 m).   Weight as of this encounter: 226 lb (102.5 kg).  BMI interpretation table: BMI level Category Range association with higher incidence of chronic pain  <18 kg/m2 Underweight   18.5-24.9 kg/m2 Ideal body weight   25-29.9 kg/m2 Overweight Increased incidence by 20%  30-34.9 kg/m2 Obese (Class I) Increased incidence by 68%  35-39.9 kg/m2 Severe obesity (Class II) Increased incidence by 136%  >40 kg/m2 Extreme obesity (Class III) Increased incidence by 254%   BMI Readings from Last 4 Encounters:  10/04/16 37.28 kg/m  09/29/16 38.79 kg/m  09/22/16 37.77 kg/m  05/31/16 36.61 kg/m   Wt Readings from Last 4 Encounters:  10/04/16 224 lb (101.6 kg)  09/29/16 226 lb (102.5 kg)  09/22/16 227 lb (103 kg)  05/31/16 220 lb (99.8 kg)  Psych/Mental status: Alert, oriented x 3 (person, place, & time) Eyes: PERLA Respiratory: No evidence of acute respiratory distress  Cervical Spine Exam  Inspection: No masses, redness, or swelling Alignment: Symmetrical Functional ROM: Unrestricted ROM Stability: No instability detected Muscle strength & Tone: Functionally intact Sensory: Unimpaired Palpation: Non-contributory  Upper Extremity (UE) Exam    Side: Right upper extremity  Side: Left upper extremity  Inspection: No masses, redness, swelling, or asymmetry  Inspection: No masses, redness,  swelling, or asymmetry  Functional ROM: Unrestricted ROM         Functional ROM: Unrestricted ROM          Muscle strength & Tone: Functionally intact  Muscle strength & Tone: Functionally intact  Sensory: Unimpaired  Sensory: Unimpaired  Palpation: Non-contributory  Palpation: Non-contributory   Thoracic Spine Exam  Inspection: No masses, redness, or swelling Alignment: Symmetrical Functional ROM: Unrestricted ROM Stability: No instability detected Sensory: Unimpaired Muscle strength & Tone: Functionally intact Palpation: Non-contributory  Lumbar Spine Exam  Inspection: No masses, redness, or swelling Alignment: Symmetrical Functional ROM: Unrestricted ROM Stability: No instability detected Muscle strength & Tone: Functionally intact Sensory: Unimpaired Palpation: Non-contributory Provocative Tests: Lumbar Hyperextension and rotation test: evaluation deferred today       Patrick's Maneuver: evaluation deferred today              Gait & Posture Assessment  Ambulation: Patient ambulates using a cane Gait: Very limited, using assistive device to ambulate Posture: Antalgic   Lower Extremity Exam    Side: Right lower extremity  Side: Left lower extremity  Inspection: No masses, redness, swelling, or asymmetry  Inspection: No masses, redness, swelling, or asymmetry  Functional ROM: Unrestricted ROM          Functional ROM: Limited ROM          Muscle strength & Tone: Functionally intact  Muscle strength & Tone: Guarding  Sensory: Unimpaired  Sensory: Movement-associated pain  Palpation: Non-contributory  Palpation: Tender   Assessment  Primary Diagnosis & Pertinent Problem List: The primary encounter diagnosis was Arthritis of knee (Left). Diagnoses of Arthropathy of knee (Left), Primary osteoarthritis of left knee, and Bilateral chronic knee pain (B) (L>R) were also pertinent to this visit.  Visit Diagnosis: 1. Arthritis of knee (Left)   2. Arthropathy of knee (Left)   3.  Primary osteoarthritis of left knee   4. Bilateral chronic knee pain (B) (L>R)    Plan of Care  Pharmacotherapy (Medications Ordered): No orders of the defined types were placed in this encounter.  New Prescriptions   No medications on file   Medications administered during this visit: Ms. Cepin had no medications administered during this visit. Lab-work, Procedure(s), & Referral(s) Ordered: Orders Placed This Encounter  Procedures  . KNEE INJECTION  . DG Knee 1-2 Views Left  . DG Knee 1-2 Views Right   Imaging & Referral(s) Ordered: None  Interventional Therapies: Pending/Scheduled/Planned:   Therapeutic left intra-articular Hyalgan knee injection #1, without fluoroscopy or IV sedation.    Considering:   Complete the series of 5 intra-articular Hyalgan knee injections.  Consider diagnostic genicular nerve block.  Consider genicular nerve radiofrequency ablation.    PRN Procedures:   None at this time.    Requested PM Follow-up: Return for Schedule Procedure, (ASAP).  Future Appointments Date Time Provider [place]  10/24/2016 9:45 AM Milinda Pointer, MD [facility] None  11/08/2016 10:45 AM Milinda Pointer, MD [facility] None  12/27/2016 10:45 AM Leone Haven, MD [facility] None   Primary Care Physician: Tommi Rumps, MD Location: [facility] Outpatient Pain Management Facility Note by: Kathlen Brunswick. Dossie Arbour, M.D, DABA, DABAPM, DABPM, DABIPP, FIPP  Pain Score Disclaimer: We use the NRS-11 scale. This is a self-reported, subjective measurement of pain severity with only modest accuracy. It is used primarily to  identify changes within a particular patient. It must be understood that outpatient pain scales are significantly less accurate that those used for research, where they can be applied under ideal controlled circumstances with minimal exposure to variables. In reality, the score is likely to be a combination of pain intensity and pain affect, where pain  affect describes the degree of emotional arousal or changes in action readiness caused by the sensory experience of pain. Factors such as social and work situation, setting, emotional state, anxiety levels, expectation, and prior pain experience may influence pain perception and show large inter-individual differences that may also be affected by time variables.  Patient instructions provided during this appointment: Patient Instructions  Trigger Point Injections Patient Information  Description: Trigger points are areas of muscle sensitive to touch which cause pain with movement, sometimes felt some distance from the site of palpation.  Usually the muscle containing these trigger points if felt as a tight band or knot.   The area of maximum tenderness or trigger point is identified, and after antiseptic preparation of the skin, a small needle is placed into this site.  Reproduction of the pain often occurs and numbing medicine (local anesthetic) is injected into the site, sometimes along with steroid preparation.  The entire block usually lasts less than 5 minutes.  Conditions which may be treated by trigger points:   Muscular pain and spasm  Nerve irritation  Preparation for the injection:  1. Do not eat any solid food or dairy products within 8 hours of your appointment. 2. You may drink clear liquids up to 3 hours before appointment.  Clear liquids include water, black coffee, juice or soda.  No milk or cream please. 3. You may take your regular medications, including pain medications, with a sip of water before your appointment.  Diabetics should hold regular insulin ( if take separately) and take 1/2 normal NPH dose the morning of the procedure.  Carry some sugar containing items with you to your appointment. 4. A driver must accompany you and be prepared to drive you home after your procedure.  5. Bring all your current medications with you. 6. An IV may be inserted and sedation may be  given at the discretion of the physician.  7. A blood pressure cuff, EKG, and other monitors will often be applied during the procedure.  Some patients may need to have extra oxygen administered for a short period. 8. You will be asked to provide medical information, including your allergies and medications, prior to the procedure.  We must know immediately if you are taking blood thinners (like Coumadin/Warfarin) or if you are allergic to IV iodine contrast (dye).  We must know if you could possibly be pregnant.  Possible side-effects:   Bleeding from needle site  Infection (rare, may require surgery)  Nerve injury (rare)  Numbness & tingling (temporary)  Punctured lung (if injection around chest)  Light-headedness (temporary)  Pain at injection site (several days)  Decreased blood pressure (rare, temporary)  Weakness in arm/leg (temporary)  Call if you experience:   Hive or difficulty breathing (go to the emergency room)  Inflammation or drainage at the injection site(s)  Please note:  Although the local anesthetic injected can often make your painful muscle feel good for several hours after the injection, the pain may return.  It takes 3-7 days for steroids to work.  You may not notice any pain relief for at least one week.  If effective, we will often do a  series of injections spaced 3-6 weeks apart to maximally decrease your pain.  If you have any questions please call [phone] [facility]  General Discharge Instructions :  If you need to reach your doctor call: Monday-Friday 8:00 am - 4:00 pm at [phone] or toll free [phone].  After clinic hours [phone] to have operator reach doctor.  Bring all of your medication bottles to all your appointments in the pain clinic.  To cancel or reschedule your appointment with Pain Management please remember to call 24 hours in advance to  avoid a fee.  Refer to the educational materials which you have been given on: General Risks, I had my Procedure. Discharge Instructions, Post Sedation.  Post Procedure Instructions:  The drugs you were given will stay in your system until tomorrow, so for the next 24 hours you should not drive, make any legal decisions or drink any alcoholic beverages.  You may eat anything you prefer, but it is better to start with liquids then soups and crackers, and gradually work up to solid foods.  Please notify your doctor immediately if you have any unusual bleeding, trouble breathing or pain that is not related to your normal pain.  Depending on the type of procedure that was done, some parts of your body may feel week and/or numb.  This usually clears up by tonight or the next day.  Walk with the use of an assistive device or accompanied by an adult for the 24 hours.  You may use ice on the affected area for the first 24 hours.  Put ice in a Ziploc bag and cover with a towel and place against area 15 minutes on 15 minutes off.  You may switch to heat after 24 hours.

## 2016-09-29 NOTE — Patient Instructions (Signed)
Trigger Point Injections Patient Information  Description: Trigger points are areas of muscle sensitive to touch which cause pain with movement, sometimes felt some distance from the site of palpation.  Usually the muscle containing these trigger points if felt as a tight band or knot.   The area of maximum tenderness or trigger point is identified, and after antiseptic preparation of the skin, a small needle is placed into this site.  Reproduction of the pain often occurs and numbing medicine (local anesthetic) is injected into the site, sometimes along with steroid preparation.  The entire block usually lasts less than 5 minutes.  Conditions which may be treated by trigger points:   Muscular pain and spasm  Nerve irritation  Preparation for the injection:  1. Do not eat any solid food or dairy products within 8 hours of your appointment. 2. You may drink clear liquids up to 3 hours before appointment.  Clear liquids include water, black coffee, juice or soda.  No milk or cream please. 3. You may take your regular medications, including pain medications, with a sip of water before your appointment.  Diabetics should hold regular insulin ( if take separately) and take 1/2 normal NPH dose the morning of the procedure.  Carry some sugar containing items with you to your appointment. 4. A driver must accompany you and be prepared to drive you home after your procedure.  5. Bring all your current medications with you. 6. An IV may be inserted and sedation may be given at the discretion of the physician.  7. A blood pressure cuff, EKG, and other monitors will often be applied during the procedure.  Some patients may need to have extra oxygen administered for a short period. 8. You will be asked to provide medical information, including your allergies and medications, prior to the procedure.  We must know immediately if you are taking blood thinners (like Coumadin/Warfarin) or if you are allergic to  IV iodine contrast (dye).  We must know if you could possibly be pregnant.  Possible side-effects:   Bleeding from needle site  Infection (rare, may require surgery)  Nerve injury (rare)  Numbness & tingling (temporary)  Punctured lung (if injection around chest)  Light-headedness (temporary)  Pain at injection site (several days)  Decreased blood pressure (rare, temporary)  Weakness in arm/leg (temporary)  Call if you experience:   Hive or difficulty breathing (go to the emergency room)  Inflammation or drainage at the injection site(s)  Please note:  Although the local anesthetic injected can often make your painful muscle feel good for several hours after the injection, the pain may return.  It takes 3-7 days for steroids to work.  You may not notice any pain relief for at least one week.  If effective, we will often do a series of injections spaced 3-6 weeks apart to maximally decrease your pain.  If you have any questions please call [phone] [facility]  General Discharge Instructions :  If you need to reach your doctor call: Monday-Friday 8:00 am - 4:00 pm at [phone] or toll free [phone].  After clinic hours [phone] to have operator reach doctor.  Bring all of your medication bottles to all your appointments in the pain clinic.  To cancel or reschedule your appointment with Pain Management please remember to call 24 hours in advance to avoid a fee.  Refer to the educational materials which you have been given on: General Risks, I  had my Procedure. Discharge Instructions, Post Sedation.  Post Procedure Instructions:  The drugs you were given will stay in your system until tomorrow, so for the next 24 hours you should not drive, make any legal decisions or drink any alcoholic beverages.  You may eat anything you prefer, but it is better to start with liquids then  soups and crackers, and gradually work up to solid foods.  Please notify your doctor immediately if you have any unusual bleeding, trouble breathing or pain that is not related to your normal pain.  Depending on the type of procedure that was done, some parts of your body may feel week and/or numb.  This usually clears up by tonight or the next day.  Walk with the use of an assistive device or accompanied by an adult for the 24 hours.  You may use ice on the affected area for the first 24 hours.  Put ice in a Ziploc bag and cover with a towel and place against area 15 minutes on 15 minutes off.  You may switch to heat after 24 hours.

## 2016-09-29 NOTE — Progress Notes (Signed)
Safety precautions to be maintained throughout the outpatient stay will include: orient to surroundings, keep bed in low position, maintain call bell within reach at all times, provide assistance with transfer out of bed and ambulation.  

## 2016-10-04 ENCOUNTER — Encounter: Payer: Self-pay | Admitting: Pain Medicine

## 2016-10-04 ENCOUNTER — Ambulatory Visit: Payer: Medicare Other | Attending: Pain Medicine | Admitting: Pain Medicine

## 2016-10-04 VITALS — BP 148/75 | HR 72 | Temp 98.0°F | Resp 16 | Ht 65.0 in | Wt 224.0 lb

## 2016-10-04 DIAGNOSIS — M1712 Unilateral primary osteoarthritis, left knee: Secondary | ICD-10-CM | POA: Diagnosis not present

## 2016-10-04 DIAGNOSIS — M129 Arthropathy, unspecified: Secondary | ICD-10-CM | POA: Insufficient documentation

## 2016-10-04 DIAGNOSIS — Z9889 Other specified postprocedural states: Secondary | ICD-10-CM | POA: Diagnosis not present

## 2016-10-04 DIAGNOSIS — G8929 Other chronic pain: Secondary | ICD-10-CM | POA: Insufficient documentation

## 2016-10-04 DIAGNOSIS — Z90710 Acquired absence of both cervix and uterus: Secondary | ICD-10-CM | POA: Insufficient documentation

## 2016-10-04 DIAGNOSIS — Z880 Allergy status to penicillin: Secondary | ICD-10-CM | POA: Diagnosis not present

## 2016-10-04 MED ORDER — LIDOCAINE HCL (PF) 1 % IJ SOLN
INTRAMUSCULAR | Status: AC
  Administered 2016-10-04: 14:00:00
  Filled 2016-10-04: qty 5

## 2016-10-04 MED ORDER — LIDOCAINE HCL (PF) 1 % IJ SOLN: 10.0000 mL | Freq: Once | INTRAMUSCULAR | Status: DC

## 2016-10-04 MED ORDER — SODIUM HYALURONATE (VISCOSUP) 20 MG/2ML IX SOSY
2.0000 mL | PREFILLED_SYRINGE | Freq: Once | INTRA_ARTICULAR | Status: AC
  Administered 2016-10-04: 2 mL via INTRA_ARTICULAR
  Filled 2016-10-04: qty 2

## 2016-10-04 NOTE — Progress Notes (Signed)
Patient's Name: Ann Spencer  MRN: [id number]  Referring Provider: Milinda Pointer, MD  DOB: 09-26-1941  PCP: Tommi Rumps, MD  DOS: 10/04/2016  Note by: Kathlen Brunswick. Dossie Arbour, MD  Service setting: Ambulatory outpatient  Location: [facility] (AMB) Pain Management Facility  Visit type: Procedure  Specialty: Interventional Pain Management  Patient type: Established   Primary Reason for Visit: Interventional Pain Management Treatment. CC: Knee Pain (left)  Procedure:  Anesthesia, Analgesia, Anxiolysis:  Type: Therapeutic Intra-Articular Hyalgan Knee Injection Region: Lateral infrapatellar Knee Region Level: Knee Joint Laterality: Left knee  Type: Local Anesthesia Local Anesthetic: Lidocaine 1% Route: Infiltration ([place]/IM) IV Access: Declined Sedation: Declined  Indication(s): Analgesia          Indications: 1. Primary osteoarthritis of left knee   2. Arthritis of knee (Left)   3. Arthropathy of knee (Left)    Pain Score: Pre-procedure: 6 /10 Post-procedure: 0-No pain/10  Pre-Procedure Assessment:  Ann Spencer is a 75 y.o. (year old), female patient, seen today for interventional treatment. She  has a past surgical history that includes vein closure (Bilateral, Feb 2008); Abdominal hysterectomy (1981); and Shoulder surgery (Left, 03/03/15).. Her primarily concern today is the Knee Pain (left) The primary encounter diagnosis was Primary osteoarthritis of left knee. Diagnoses of Arthritis of knee (Left) and Arthropathy of knee (Left) were also pertinent to this visit.  Pain Type: Chronic pain Pain Location: Knee (Throbbing) Pain Orientation: Left Pain Descriptors / Indicators: Throbbing Pain Frequency: Constant  Date of Last Visit: 09/29/16 Service Provided on Last Visit: Evaluation  Coagulation Parameters Lab Results  Component Value Date   PLT 191.0 02/20/2015   Verification of the correct person, correct site (including marking of site), and correct procedure were performed  and confirmed by the patient.  Consent: Before the procedure and under the influence of no sedative(s), amnesic(s), or anxiolytics, the patient was informed of the treatment options, risks and possible complications. To fulfill our ethical and legal obligations, as recommended by the American Medical Association's Code of Ethics, I have informed the patient of my clinical impression; the nature and purpose of the treatment or procedure; the risks, benefits, and possible complications of the intervention; the alternatives, including doing nothing; the risk(s) and benefit(s) of the alternative treatment(s) or procedure(s); and the risk(s) and benefit(s) of doing nothing. The patient was provided information about the general risks and possible complications associated with the procedure. These may include, but are not limited to: failure to achieve desired goals, infection, bleeding, organ or nerve damage, allergic reactions, paralysis, and death. In addition, the patient was informed of those risks and complications associated to the procedure, such as failure to decrease pain; infection; bleeding; organ or nerve damage with subsequent damage to sensory, motor, and/or autonomic systems, resulting in permanent pain, numbness, and/or weakness of one or several areas of the body; allergic reactions; (i.e.: anaphylactic reaction); and/or death. Furthermore, the patient was informed of those risks and complications associated with the medications. These include, but are not limited to: allergic reactions (i.e.: anaphylactic or anaphylactoid reaction(s)); adrenal axis suppression; blood sugar elevation that in diabetics may result in ketoacidosis or comma; water retention that in patients with history of congestive heart failure may result in shortness of breath, pulmonary edema, and decompensation with resultant heart failure; weight gain; swelling or edema; medication-induced neural toxicity; particulate matter  embolism and blood vessel occlusion with resultant organ, and/or nervous system infarction; and/or aseptic necrosis of one or more joints. Finally, the patient was informed that Medicine  is not an Chief Strategy Officer; therefore, there is also the possibility of unforeseen or unpredictable risks and/or possible complications that may result in a catastrophic outcome. The patient indicated having understood very clearly. We have given the patient no guarantees and we have made no promises. Enough time was given to the patient to ask questions, all of which were answered to the patient's satisfaction. Ann Spencer has indicated that she wanted to continue with the procedure.  Consent Attestation: I, the ordering provider, attest that I have discussed with the patient the benefits, risks, side-effects, alternatives, likelihood of achieving goals, and potential problems during recovery for the procedure that I have provided informed consent.  Pre-Procedure Preparation:  Safety Precautions: Allergies reviewed. The patient was asked about blood thinners, or active infections, both of which were denied. The patient was asked to confirm the procedure and laterality, before marking the site, and again before commencing the procedure. Appropriate site, procedure, and patient were confirmed by following the Joint Commission's Universal Protocol (UP.01.01.01), in the form of a "Time Out". The patient was asked to participate by confirming the accuracy of the "Time Out" information. Patient was assessed for positional comfort and pressure points before starting the procedure. Allergies: She is allergic to penicillins.. Allergy Precautions: None required Infection Control Precautions: Sterile technique used. Standard Universal Precautions were taken as recommended by the Department of [facility] for Disease Control and Prevention (CDC). Standard pre-surgical skin prep was conducted. Respiratory hygiene and cough  etiquette was practiced. Hand hygiene observed. Safe injection practices and needle disposal techniques followed. SDV (single dose vial) medications used. Medications properly checked for expiration dates and contaminants. Personal protective equipment (PPE) used as per protocol. Monitoring:  As per clinic protocol. Vitals:   10/04/16 1246 10/04/16 1330 10/04/16 1340  BP: (!) 142/78 (!) 155/78 (!) 148/75  Pulse: 75 75 72  Resp: 16 16   Temp: 98 F (36.7 C)    SpO2: 98% 99% 100%  Weight: 224 lb (101.6 kg)    Height: 5\' 5"  (1.651 m)    Calculated BMI: Body mass index is 37.28 kg/m. Time-out: "Time-out" completed before starting procedure, as per protocol.  Description of Procedure Process:   Time-out: "Time-out" completed before starting procedure, as per protocol. Position: Sitting Target Area: Knee Joint Approach: Just above the Lateral tibial plateau, lateral to the infrapatellar tendon. Area Prepped: Entire knee area, from the mid-thigh to the mid-shin. Prepping solution: ChloraPrep (2% chlorhexidine gluconate and 70% isopropyl alcohol) Safety Precautions: Aspiration looking for blood return was conducted prior to all injections. At no point did we inject any substances, as a needle was being advanced. No attempts were made at seeking any paresthesias. Safe injection practices and needle disposal techniques used. Medications properly checked for expiration dates. SDV (single dose vial) medications used. Description of the Procedure: Protocol guidelines were followed. The patient was placed in position over the fluoroscopy table. The target area was identified and the area prepped in the usual manner. Skin desensitized using vapocoolant spray. Skin & deeper tissues infiltrated with local anesthetic. Appropriate amount of time allowed to pass for local anesthetics to take effect. The procedure needles were then advanced to the target area. Proper needle placement secured. Negative aspiration  confirmed. Solution injected in intermittent fashion, asking for systemic symptoms every 0.5cc of injectate. The needles were then removed and the area cleansed, making sure to leave some of the prepping solution back to take advantage of its long term bactericidal properties. EBL: None Materials &  Medications Used:  Needle(s) Used: 22g - 1.5" Needle(s) Medications Administered today: Please see chart for details.  Imaging Guidance:  Type of Imaging Technique: None used Indication(s): N/A Exposure Time: No patient exposure Contrast: None used. Fluoroscopic Guidance: N/A Ultrasound Guidance: N/A Interpretation: N/A  Antibiotic Prophylaxis:  Indication(s): No indications identified. Type:  Antibiotics Given (last 72 hours)    None      Post-operative Assessment:  Complications: No immediate post-treatment complications observed by team, or reported by patient. Disposition: The patient tolerated the entire procedure well. A repeat set of vitals were taken after the procedure and the patient was kept under observation following institutional policy, for this type of procedure. Post-procedural neurological assessment was performed, showing return to baseline, prior to discharge. The patient was provided with post-procedure discharge instructions, including a section on how to identify potential problems. Should any problems arise concerning this procedure, the patient was given instructions to immediately contact us, at any time, without hesitation. In any case, we plan to contact the patient by telephone for a follow-up status report regarding this interventional procedure. Comments:  No additional relevant information.  Plan of Care  Discharge to: Discharge home  Medications ordered for procedure: Meds ordered this encounter  Medications  . lidocaine (PF) (XYLOCAINE) 1 % injection    TICE, KORI: cabinet override  . Sodium Hyaluronate SOSY 2 mL  . lidocaine (PF) (XYLOCAINE) 1 %  injection 10 mL   Medications administered: (For more details, see medical record) We administered lidocaine (PF) and Sodium Hyaluronate.  Imaging Ordered: No results found for this or any previous visit. New Prescriptions   No medications on file   Physician-requested Follow-up:  Return in about 2 weeks (around 10/18/2016) for Post-Procedure evaluation, In addition, Schedule Procedure.  Future Appointments Date Time Provider [place]  10/24/2016 9:45 AM Milinda Pointer, MD [facility] None  11/08/2016 10:45 AM Milinda Pointer, MD [facility] None  12/27/2016 10:45 AM Leone Haven, MD [facility] None   Primary Care Physician: Tommi Rumps, MD Location: [facility] Note by: Kathlen Brunswick. Dossie Arbour, M.D, DABA, DABAPM, DABPM, DABIPP, FIPP  Disclaimer:  Medicine is not an exact science. The only guarantee in medicine is that nothing is guaranteed. It is important to note that the decision to proceed with this intervention was based on the information collected from the patient. The Data and conclusions were drawn from the patient's questionnaire, the interview, and the physical examination. Because the information was provided in large part by the patient, it cannot be guaranteed that it has not been purposely or unconsciously manipulated. Every effort has been made to obtain as much relevant data as possible for this evaluation. It is important to note that the conclusions that lead to this procedure are derived in large part from the available data. Always take into account that the treatment will also be dependent on availability of resources and existing treatment guidelines, considered by other Pain Management Practitioners as being common knowledge and practice, at the time of the intervention. For Medico-Legal purposes, it is also important to point out that variation in procedural techniques and pharmacological choices are the acceptable norm. The  indications, contraindications, technique, and results of the above procedure should only be interpreted and judged by a Board-Certified Interventional Pain Specialist with extensive familiarity and expertise in the same exact procedure and technique. Attempts at providing opinions without similar or greater experience and expertise than that of the treating physician will be considered as inappropriate and unethical,  and shall result in a formal complaint to the state medical board and applicable specialty societies.  Instructions provided at this appointment: Patient Instructions  Knee Injection A knee injection is a procedure to get medicine into your knee joint. Your health care provider puts a needle into the joint and injects medicine with an attached syringe. The injected medicine may relieve the pain, swelling, and stiffness of arthritis. The injected medicine may also help to lubricate and cushion your knee joint. You may need more than one injection. LET [facility] CARE PROVIDER KNOW ABOUT:  Any allergies you have.  All medicines you are taking, including vitamins, herbs, eye drops, creams, and over-the-counter medicines.  Previous problems you or members of your family have had with the use of anesthetics.  Any blood disorders you have.  Previous surgeries you have had.  Any medical conditions you may have. RISKS AND COMPLICATIONS Generally, this is a safe procedure. However, problems may occur, including:  Infection.  Bleeding.  Worsening symptoms.  Damage to the area around your knee.  Allergic reaction to any of the medicines.  Skin reactions from repeated injections. BEFORE THE PROCEDURE  Ask your health care provider about changing or stopping your regular medicines. This is especially important if you are taking diabetes medicines or blood thinners.  Plan to have someone take you home after the procedure. PROCEDURE  You will sit or lie down in a position for  your knee to be treated.  The skin over your kneecap will be cleaned with a germ-killing solution (antiseptic).  You will be given a medicine that numbs the area (local anesthetic). You may feel some stinging.  After your knee becomes numb, you will have a second injection. This is the medicine. This needle is carefully placed between your kneecap and your knee. The medicine is injected into the joint space.  At the end of the procedure, the needle will be removed.  A bandage (dressing) may be placed over the injection site. The procedure may vary among health care providers and hospitals. AFTER THE PROCEDURE  You may have to move your knee through its full range of motion. This helps to get all of the medicine into your joint space.  Your blood pressure, heart rate, breathing rate, and blood oxygen level will be monitored often until the medicines you were given have worn off.  You will be watched to make sure that you do not have a reaction to the injected medicine.   This information is not intended to replace advice given to you by your health care provider. Make sure you discuss any questions you have with your health care provider.   Document Released: 02/19/2007 Document Revised: 12/19/2014 Document Reviewed: 10/08/2014 Elsevier Interactive Patient Education 2016 [employer] Pain Management Discharge Instructions  General Discharge Instructions :  If you need to reach your doctor call: Monday-Friday 8:00 am - 4:00 pm at [phone] or toll free [phone].  After clinic hours [phone] to have operator reach doctor.  Bring all of your medication bottles to all your appointments in the pain clinic.  To cancel or reschedule your appointment with Pain Management please remember to call 24 hours in advance to avoid a fee.  Refer to the educational materials which you have been given on: General Risks, I had my Procedure. Discharge Instructions, Post Sedation.  Post  Procedure Instructions:  The drugs you were given will stay in your system until tomorrow, so for the next 24 hours you should not  drive, make any legal decisions or drink any alcoholic beverages.  You may eat anything you prefer, but it is better to start with liquids then soups and crackers, and gradually work up to solid foods.  Please notify your doctor immediately if you have any unusual bleeding, trouble breathing or pain that is not related to your normal pain.  Depending on the type of procedure that was done, some parts of your body may feel week and/or numb.  This usually clears up by tonight or the next day.  Walk with the use of an assistive device or accompanied by an adult for the 24 hours.  You may use ice on the affected area for the first 24 hours.  Put ice in a Ziploc bag and cover with a towel and place against area 15 minutes on 15 minutes off.  You may switch to heat after 24 hours.

## 2016-10-04 NOTE — Progress Notes (Signed)
Safety precautions to be maintained throughout the outpatient stay will include: orient to surroundings, keep bed in low position, maintain call bell within reach at all times, provide assistance with transfer out of bed and ambulation.  

## 2016-10-04 NOTE — Patient Instructions (Signed)
Knee Injection A knee injection is a procedure to get medicine into your knee joint. Your health care provider puts a needle into the joint and injects medicine with an attached syringe. The injected medicine may relieve the pain, swelling, and stiffness of arthritis. The injected medicine may also help to lubricate and cushion your knee joint. You may need more than one injection. LET [facility] CARE PROVIDER KNOW ABOUT:  Any allergies you have.  All medicines you are taking, including vitamins, herbs, eye drops, creams, and over-the-counter medicines.  Previous problems you or members of your family have had with the use of anesthetics.  Any blood disorders you have.  Previous surgeries you have had.  Any medical conditions you may have. RISKS AND COMPLICATIONS Generally, this is a safe procedure. However, problems may occur, including:  Infection.  Bleeding.  Worsening symptoms.  Damage to the area around your knee.  Allergic reaction to any of the medicines.  Skin reactions from repeated injections. BEFORE THE PROCEDURE  Ask your health care provider about changing or stopping your regular medicines. This is especially important if you are taking diabetes medicines or blood thinners.  Plan to have someone take you home after the procedure. PROCEDURE  You will sit or lie down in a position for your knee to be treated.  The skin over your kneecap will be cleaned with a germ-killing solution (antiseptic).  You will be given a medicine that numbs the area (local anesthetic). You may feel some stinging.  After your knee becomes numb, you will have a second injection. This is the medicine. This needle is carefully placed between your kneecap and your knee. The medicine is injected into the joint space.  At the end of the procedure, the needle will be removed.  A bandage (dressing) may be placed over the injection site. The procedure may vary among health care providers  and hospitals. AFTER THE PROCEDURE  You may have to move your knee through its full range of motion. This helps to get all of the medicine into your joint space.  Your blood pressure, heart rate, breathing rate, and blood oxygen level will be monitored often until the medicines you were given have worn off.  You will be watched to make sure that you do not have a reaction to the injected medicine.   This information is not intended to replace advice given to you by your health care provider. Make sure you discuss any questions you have with your health care provider.   Document Released: 02/19/2007 Document Revised: 12/19/2014 Document Reviewed: 10/08/2014 [employer] Interactive Patient Education 2016 [employer] Pain Management Discharge Instructions  General Discharge Instructions :  If you need to reach your doctor call: Monday-Friday 8:00 am - 4:00 pm at [phone] or toll free [phone].  After clinic hours [phone] to have operator reach doctor.  Bring all of your medication bottles to all your appointments in the pain clinic.  To cancel or reschedule your appointment with Pain Management please remember to call 24 hours in advance to avoid a fee.  Refer to the educational materials which you have been given on: General Risks, I had my Procedure. Discharge Instructions, Post Sedation.  Post Procedure Instructions:  The drugs you were given will stay in your system until tomorrow, so for the next 24 hours you should not drive, make any legal decisions or drink any alcoholic beverages.  You may eat anything you prefer, but it is better to start with liquids then  soups and crackers, and gradually work up to solid foods.  Please notify your doctor immediately if you have any unusual bleeding, trouble breathing or pain that is not related to your normal pain.  Depending on the type of procedure that was done, some parts of your body may feel week and/or numb.  This  usually clears up by tonight or the next day.  Walk with the use of an assistive device or accompanied by an adult for the 24 hours.  You may use ice on the affected area for the first 24 hours.  Put ice in a Ziploc bag and cover with a towel and place against area 15 minutes on 15 minutes off.  You may switch to heat after 24 hours.

## 2016-10-05 NOTE — Telephone Encounter (Signed)
Patient verbalizes no complications or concerns from procedure on yesterday.  

## 2016-10-09 ENCOUNTER — Encounter: Payer: Self-pay | Admitting: Pain Medicine

## 2016-10-09 NOTE — Progress Notes (Signed)
Results were reviewed and found to be: significantly abnormal  Surgical consultation is recommended  Review would suggest the patient to be a possible candidate for interventional pain management options

## 2016-10-24 ENCOUNTER — Ambulatory Visit: Payer: Medicare Other | Attending: Pain Medicine | Admitting: Pain Medicine

## 2016-10-24 ENCOUNTER — Encounter: Payer: Self-pay | Admitting: Pain Medicine

## 2016-10-24 VITALS — BP 167/85 | HR 85 | Temp 97.7°F | Resp 16 | Ht 65.0 in | Wt 224.0 lb

## 2016-10-24 DIAGNOSIS — Z880 Allergy status to penicillin: Secondary | ICD-10-CM | POA: Insufficient documentation

## 2016-10-24 DIAGNOSIS — M1712 Unilateral primary osteoarthritis, left knee: Secondary | ICD-10-CM | POA: Diagnosis not present

## 2016-10-24 DIAGNOSIS — M2392 Unspecified internal derangement of left knee: Secondary | ICD-10-CM | POA: Diagnosis not present

## 2016-10-24 DIAGNOSIS — G8929 Other chronic pain: Secondary | ICD-10-CM | POA: Diagnosis not present

## 2016-10-24 MED ORDER — SODIUM HYALURONATE (VISCOSUP) 20 MG/2ML IX SOSY
2.0000 mL | PREFILLED_SYRINGE | Freq: Once | INTRA_ARTICULAR | Status: AC
  Administered 2016-10-24: 2 mL via INTRA_ARTICULAR
  Filled 2016-10-24: qty 2

## 2016-10-24 MED ORDER — ROPIVACAINE HCL 2 MG/ML IJ SOLN: 9.0000 mL | Freq: Once | INTRAMUSCULAR | Status: DC

## 2016-10-24 MED ORDER — LIDOCAINE HCL (PF) 1 % IJ SOLN
10.0000 mL | Freq: Once | INTRAMUSCULAR | Status: AC
  Administered 2016-10-24: 10 mL

## 2016-10-24 MED ORDER — LIDOCAINE HCL (PF) 1 % IJ SOLN
INTRAMUSCULAR | Status: AC
  Filled 2016-10-24: qty 5

## 2016-10-24 NOTE — Progress Notes (Signed)
Patient's Name: Ann Spencer  MRN: [id number]  Referring Provider: Leone Haven, MD  DOB: 1941-11-08  PCP: Leone Haven, MD  DOS: 10/24/2016  Note by: Kathlen Brunswick. Dossie Arbour, MD  Service setting: Ambulatory outpatient  Location: [facility] (AMB) Pain Management Facility  Visit type: Procedure  Specialty: Interventional Pain Management  Patient type: Established   Primary Reason for Visit: Interventional Pain Management Treatment. CC: Knee Pain (left)  Procedure:  Anesthesia, Analgesia, Anxiolysis:  Type: Palliative Intra-Articular Hyalgan Knee Injection Region: Lateral infrapatellar Knee Region Level: Knee Joint Laterality: Left knee  Type: Local Anesthesia Local Anesthetic: Lidocaine 1% Route: Infiltration (Stronach/IM) IV Access: Declined Sedation: Declined  Indication(s): Analgesia          Indications: 1. Primary osteoarthritis of left knee   2. Knee derangement (Left)   3. Arthritis of knee (Left)    Pain Score: Pre-procedure: 1 /10 Post-procedure: 0-No pain (knee numb)/10  Pre-Procedure Assessment:  Ann Spencer is a 75 y.o. (year old), female patient, seen today for interventional treatment. She  has a past surgical history that includes vein closure (Bilateral, Feb 2008); Abdominal hysterectomy (1981); and Shoulder surgery (Left, 03/03/15).. Her primarily concern today is the Knee Pain (left) The primary encounter diagnosis was Primary osteoarthritis of left knee. Diagnoses of Knee derangement (Left) and Arthritis of knee (Left) were also pertinent to this visit.  Pain Type: Chronic pain Pain Location: Knee Pain Orientation: Left Pain Descriptors / Indicators: Aching, Sore Pain Frequency: Constant  Date of Last Visit: 10/04/16 Service Provided on Last Visit: Procedure  Coagulation Parameters Lab Results  Component Value Date   PLT 191.0 02/20/2015   Verification of the correct person, correct site (including marking of site), and correct procedure were performed and  confirmed by the patient.  Consent: Before the procedure and under the influence of no sedative(s), amnesic(s), or anxiolytics, the patient was informed of the treatment options, risks and possible complications. To fulfill our ethical and legal obligations, as recommended by the American Medical Association's Code of Ethics, I have informed the patient of my clinical impression; the nature and purpose of the treatment or procedure; the risks, benefits, and possible complications of the intervention; the alternatives, including doing nothing; the risk(s) and benefit(s) of the alternative treatment(s) or procedure(s); and the risk(s) and benefit(s) of doing nothing. The patient was provided information about the general risks and possible complications associated with the procedure. These may include, but are not limited to: failure to achieve desired goals, infection, bleeding, organ or nerve damage, allergic reactions, paralysis, and death. In addition, the patient was informed of those risks and complications associated to the procedure, such as failure to decrease pain; infection; bleeding; organ or nerve damage with subsequent damage to sensory, motor, and/or autonomic systems, resulting in permanent pain, numbness, and/or weakness of one or several areas of the body; allergic reactions; (i.e.: anaphylactic reaction); and/or death. Furthermore, the patient was informed of those risks and complications associated with the medications. These include, but are not limited to: allergic reactions (i.e.: anaphylactic or anaphylactoid reaction(s)); adrenal axis suppression; blood sugar elevation that in diabetics may result in ketoacidosis or comma; water retention that in patients with history of congestive heart failure may result in shortness of breath, pulmonary edema, and decompensation with resultant heart failure; weight gain; swelling or edema; medication-induced neural toxicity; particulate matter  embolism and blood vessel occlusion with resultant organ, and/or nervous system infarction; and/or aseptic necrosis of one or more joints. Finally, the patient was informed  that Medicine is not an exact science; therefore, there is also the possibility of unforeseen or unpredictable risks and/or possible complications that may result in a catastrophic outcome. The patient indicated having understood very clearly. We have given the patient no guarantees and we have made no promises. Enough time was given to the patient to ask questions, all of which were answered to the patient's satisfaction. [name] has indicated that she wanted to continue with the procedure.  Consent Attestation: I, the ordering provider, attest that I have discussed with the patient the benefits, risks, side-effects, alternatives, likelihood of achieving goals, and potential problems during recovery for the procedure that I have provided informed consent.  Pre-Procedure Preparation:  Safety Precautions: Allergies reviewed. The patient was asked about blood thinners, or active infections, both of which were denied. The patient was asked to confirm the procedure and laterality, before marking the site, and again before commencing the procedure. Appropriate site, procedure, and patient were confirmed by following the Joint Commission's Universal Protocol (UP.01.01.01), in the form of a "Time Out". The patient was asked to participate by confirming the accuracy of the "Time Out" information. Patient was assessed for positional comfort and pressure points before starting the procedure. Allergies: She is allergic to penicillins. Allergy Precautions: None required Infection Control Precautions: Sterile technique used. Standard Universal Precautions were taken as recommended by the Department of [facility] for Disease Control and Prevention (CDC). Standard pre-surgical skin prep was conducted. Respiratory hygiene and cough  etiquette was practiced. Hand hygiene observed. Safe injection practices and needle disposal techniques followed. SDV (single dose vial) medications used. Medications properly checked for expiration dates and contaminants. Personal protective equipment (PPE) used as per protocol. Monitoring:  As per clinic protocol. Vitals:   10/24/16 0956  BP: (!) 167/85  Pulse: 85  Resp: 16  Temp: 97.7 F (36.5 C)  SpO2: 100%  Weight: 224 lb (101.6 kg)  Height: 5\' 5"  (1.651 m)  Calculated BMI: Body mass index is 37.28 kg/m. Time-out: "Time-out" completed before starting procedure, as per protocol.  Description of Procedure Process:   Time-out: "Time-out" completed before starting procedure, as per protocol. Position: Sitting Target Area: Knee Joint Approach: Just above the Lateral tibial plateau, lateral to the infrapatellar tendon. Area Prepped: Entire knee area, from the mid-thigh to the mid-shin. Prepping solution: ChloraPrep (2% chlorhexidine gluconate and 70% isopropyl alcohol) Safety Precautions: Aspiration looking for blood return was conducted prior to all injections. At no point did we inject any substances, as a needle was being advanced. No attempts were made at seeking any paresthesias. Safe injection practices and needle disposal techniques used. Medications properly checked for expiration dates. SDV (single dose vial) medications used. Description of the Procedure: Protocol guidelines were followed. The patient was placed in position over the fluoroscopy table. The target area was identified and the area prepped in the usual manner. Skin desensitized using vapocoolant spray. Skin & deeper tissues infiltrated with local anesthetic. Appropriate amount of time allowed to pass for local anesthetics to take effect. The procedure needles were then advanced to the target area. Proper needle placement secured. Negative aspiration confirmed. Solution injected in intermittent fashion, asking for  systemic symptoms every 0.5cc of injectate. The needles were then removed and the area cleansed, making sure to leave some of the prepping solution back to take advantage of its long term bactericidal properties. EBL: None Materials & Medications Used:  Needle(s) Used: 22g - 1.5" Needle(s) Medications Administered today: Please see chart for details.  Imaging Guidance:  Type of Imaging Technique: None used Indication(s): N/A Exposure Time: No patient exposure Contrast: None used. Fluoroscopic Guidance: N/A Ultrasound Guidance: N/A Interpretation: N/A  Antibiotic Prophylaxis:  Indication(s): No indications identified. Type:  Antibiotics Given (last 72 hours)    None      Post-operative Assessment:  Complications: No immediate post-treatment complications observed by team, or reported by patient. Disposition: The patient tolerated the entire procedure well. A repeat set of vitals were taken after the procedure and the patient was kept under observation following institutional policy, for this type of procedure. Post-procedural neurological assessment was performed, showing return to baseline, prior to discharge. The patient was provided with post-procedure discharge instructions, including a section on how to identify potential problems. Should any problems arise concerning this procedure, the patient was given instructions to immediately contact us, at any time, without hesitation. In any case, we plan to contact the patient by telephone for a follow-up status report regarding this interventional procedure. Comments:  No additional relevant information.  Plan of Care  Discharge to: Discharge home  Medications ordered for procedure: Meds ordered this encounter  Medications  . lidocaine (PF) (XYLOCAINE) 1 % injection    GARNER, CYNTHIA: cabinet override  . Sodium Hyaluronate SOSY 2 mL  . lidocaine (PF) (XYLOCAINE) 1 % injection 10 mL  . ropivacaine (PF) 2 mg/ml (0.2%) (NAROPIN)  epidural 9 mL   Medications administered: (For more details, see medical record) Ms. Munar had no medications administered during this visit. Lab-work, Procedure(s), & Referral(s) Ordered: Orders Placed This Encounter  Procedures  . KNEE INJECTION  . KNEE INJECTION   Imaging Ordered: No results found for this or any previous visit. New Prescriptions   No medications on file   Physician-requested Follow-up:  Return in about 2 weeks (around 11/07/2016) for Post-Procedure evaluation, procedure.  Future Appointments Date Time Provider [place]  11/09/2016 9:30 AM Milinda Pointer, MD [facility] None  12/27/2016 10:45 AM Leone Haven, MD [facility] None   Primary Care Physician: Leone Haven, MD Location: [facility] Note by: Kathlen Brunswick. Dossie Arbour, M.D, DABA, DABAPM, DABPM, DABIPP, FIPP  Disclaimer:  Medicine is not an exact science. The only guarantee in medicine is that nothing is guaranteed. It is important to note that the decision to proceed with this intervention was based on the information collected from the patient. The Data and conclusions were drawn from the patient's questionnaire, the interview, and the physical examination. Because the information was provided in large part by the patient, it cannot be guaranteed that it has not been purposely or unconsciously manipulated. Every effort has been made to obtain as much relevant data as possible for this evaluation. It is important to note that the conclusions that lead to this procedure are derived in large part from the available data. Always take into account that the treatment will also be dependent on availability of resources and existing treatment guidelines, considered by other Pain Management Practitioners as being common knowledge and practice, at the time of the intervention. For Medico-Legal purposes, it is also important to point out that variation in procedural techniques  and pharmacological choices are the acceptable norm. The indications, contraindications, technique, and results of the above procedure should only be interpreted and judged by a Board-Certified Interventional Pain Specialist with extensive familiarity and expertise in the same exact procedure and technique. Attempts at providing opinions without similar or greater experience and expertise than that of the treating physician will be considered  as inappropriate and unethical, and shall result in a formal complaint to the state medical board and applicable specialty societies.  Instructions provided at this appointment: There are no Patient Instructions on file for this visit.

## 2016-10-24 NOTE — Progress Notes (Signed)
Safety precautions to be maintained throughout the outpatient stay will include: orient to surroundings, keep bed in low position, maintain call bell within reach at all times, provide assistance with transfer out of bed and ambulation.  

## 2016-10-25 ENCOUNTER — Telehealth: Payer: Self-pay | Admitting: *Deleted

## 2016-10-25 NOTE — Telephone Encounter (Signed)
Attempted to call patient for post procedure follow up. No answer.

## 2016-11-08 ENCOUNTER — Ambulatory Visit: Payer: Medicare Other | Admitting: Pain Medicine

## 2016-11-09 ENCOUNTER — Ambulatory Visit: Payer: Medicare Other | Attending: Pain Medicine | Admitting: Pain Medicine

## 2016-11-09 ENCOUNTER — Encounter: Payer: Self-pay | Admitting: Pain Medicine

## 2016-11-09 VITALS — BP 147/74 | HR 69 | Temp 97.7°F | Resp 16 | Ht 66.0 in | Wt 224.0 lb

## 2016-11-09 DIAGNOSIS — M2392 Unspecified internal derangement of left knee: Secondary | ICD-10-CM

## 2016-11-09 DIAGNOSIS — M1712 Unilateral primary osteoarthritis, left knee: Secondary | ICD-10-CM

## 2016-11-09 DIAGNOSIS — Z880 Allergy status to penicillin: Secondary | ICD-10-CM | POA: Insufficient documentation

## 2016-11-09 MED ORDER — ROPIVACAINE HCL 2 MG/ML IJ SOLN
9.0000 mL | Freq: Once | INTRAMUSCULAR | Status: DC
  Filled 2016-11-09: qty 10

## 2016-11-09 MED ORDER — SODIUM HYALURONATE (VISCOSUP) 20 MG/2ML IX SOSY
2.0000 mL | PREFILLED_SYRINGE | Freq: Once | INTRA_ARTICULAR | Status: AC
Start: 2016-11-09 — End: 2016-11-09
  Administered 2016-11-09: 2 mL via INTRA_ARTICULAR
  Filled 2016-11-09: qty 2

## 2016-11-09 MED ORDER — LIDOCAINE HCL (PF) 1 % IJ SOLN
10.0000 mL | Freq: Once | INTRAMUSCULAR | Status: AC
  Administered 2016-11-09: 10 mL
  Filled 2016-11-09: qty 10

## 2016-11-09 NOTE — Progress Notes (Signed)
Safety precautions to be maintained throughout the outpatient stay will include: orient to surroundings, keep bed in low position, maintain call bell within reach at all times, provide assistance with transfer out of bed and ambulation.  

## 2016-11-09 NOTE — Patient Instructions (Signed)
Pain Management Discharge Instructions  General Discharge Instructions :  If you need to reach your doctor call: Monday-Friday 8:00 am - 4:00 pm at [phone] or toll free [phone].  After clinic hours [phone] to have operator reach doctor.  Bring all of your medication bottles to all your appointments in the pain clinic.  To cancel or reschedule your appointment with Pain Management please remember to call 24 hours in advance to avoid a fee.  Refer to the educational materials which you have been given on: General Risks, I had my Procedure. Discharge Instructions, Post Sedation.  Post Procedure Instructions:  The drugs you were given will stay in your system until tomorrow, so for the next 24 hours you should not drive, make any legal decisions or drink any alcoholic beverages.  You may eat anything you prefer, but it is better to start with liquids then soups and crackers, and gradually work up to solid foods.  Please notify your doctor immediately if you have any unusual bleeding, trouble breathing or pain that is not related to your normal pain.  Depending on the type of procedure that was done, some parts of your body may feel week and/or numb.  This usually clears up by tonight or the next day.  Walk with the use of an assistive device or accompanied by an adult for the 24 hours.  You may use ice on the affected area for the first 24 hours.  Put ice in a Ziploc bag and cover with a towel and place against area 15 minutes on 15 minutes off.  You may switch to heat after 24 hours.Knee Injection A knee injection is a procedure to get medicine into your knee joint. Your health care provider puts a needle into the joint and injects medicine with an attached syringe. The injected medicine may relieve the pain, swelling, and stiffness of arthritis. The injected medicine may also help to lubricate and cushion your knee joint. You may need more than one injection. Tell a health  care provider about:  Any allergies you have.  All medicines you are taking, including vitamins, herbs, eye drops, creams, and over-the-counter medicines.  Any problems you or family members have had with anesthetic medicines.  Any blood disorders you have.  Any surgeries you have had.  Any medical conditions you have. What are the risks? Generally, this is a safe procedure. However, problems may occur, including:  Infection.  Bleeding.  Worsening symptoms.  Damage to the area around your knee.  Allergic reaction to any of the medicines.  Skin reactions from repeated injections. What happens before the procedure?  Ask your health care provider about changing or stopping your regular medicines. This is especially important if you are taking diabetes medicines or blood thinners.  Plan to have someone take you home after the procedure. What happens during the procedure?  You will sit or lie down in a position for your knee to be treated.  The skin over your kneecap will be cleaned with a germ-killing solution (antiseptic).  You will be given a medicine that numbs the area (local anesthetic). You may feel some stinging.  After your knee becomes numb, you will have a second injection. This is the medicine. This needle is carefully placed between your kneecap and your knee. The medicine is injected into the joint space.  At the end of the procedure, the needle will be removed.  A bandage (dressing) may be placed over the injection site. The procedure may vary  among health care providers and hospitals. What happens after the procedure?  You may have to move your knee through its full range of motion. This helps to get all of the medicine into your joint space.  Your blood pressure, heart rate, breathing rate, and blood oxygen level will be monitored often until the medicines you were given have worn off.  You will be watched to make sure that you do not have a reaction to  the injected medicine. This information is not intended to replace advice given to you by your health care provider. Make sure you discuss any questions you have with your health care provider. Document Released: 02/19/2007 Document Revised: 04/29/2016 Document Reviewed: 10/08/2014 Elsevier Interactive Patient Education  2017 [employer].

## 2016-11-09 NOTE — Progress Notes (Signed)
Patient's Name: Ann Spencer  MRN: [id number]  Referring Provider: Leone Haven, MD  DOB: 11-Nov-1941  PCP: Leone Haven, MD  DOS: 11/09/2016  Note by: Kathlen Brunswick. Dossie Arbour, MD  Service setting: Ambulatory outpatient  Location: [facility] (AMB) Pain Management Facility  Visit type: Procedure  Specialty: Interventional Pain Management  Patient type: Established   Primary Reason for Visit: Interventional Pain Management Treatment. CC: Knee Pain (left)  Procedure:  Anesthesia, Analgesia, Anxiolysis:  Type: Palliative Intra-Articular Hyalgan Knee Injection #3 Region: Lateral infrapatellar Knee Region Level: Knee Joint Laterality: Left knee  Type: Local Anesthesia Local Anesthetic: Lidocaine 1% Route: Infiltration (Babb/IM) IV Access: Declined Sedation: Declined  Indication(s): Analgesia          Indications: 1. Tricompartment osteoarthritis of left knee   2. Arthritis of knee (Left)   3. Knee derangement (Left)    Pain Score: Pre-procedure: 0-No pain/10 Post-procedure: 0-No pain/10  Pre-Procedure Assessment:  Ann Spencer is a 75 y.o. (year old), female patient, seen today for interventional treatment. She  has a past surgical history that includes vein closure (Bilateral, Feb 2008); Abdominal hysterectomy (1981); and Shoulder surgery (Left, 03/03/15).. Her primarily concern today is the Knee Pain (left) The primary encounter diagnosis was Tricompartment osteoarthritis of left knee. Diagnoses of Arthritis of knee (Left) and Knee derangement (Left) were also pertinent to this visit.  Pain Type: Chronic pain Pain Location: Knee Pain Orientation: Left Pain Descriptors / Indicators: Sore Pain Frequency: Intermittent  Date of Last Visit: 10/24/16 Service Provided on Last Visit: Procedure  Coagulation Parameters Lab Results  Component Value Date   PLT 191.0 02/20/2015   Verification of the correct person, correct site (including marking of site), and correct procedure were  performed and confirmed by the patient.  Consent: Before the procedure and under the influence of no sedative(s), amnesic(s), or anxiolytics, the patient was informed of the treatment options, risks and possible complications. To fulfill our ethical and legal obligations, as recommended by the American Medical Association's Code of Ethics, I have informed the patient of my clinical impression; the nature and purpose of the treatment or procedure; the risks, benefits, and possible complications of the intervention; the alternatives, including doing nothing; the risk(s) and benefit(s) of the alternative treatment(s) or procedure(s); and the risk(s) and benefit(s) of doing nothing. The patient was provided information about the general risks and possible complications associated with the procedure. These may include, but are not limited to: failure to achieve desired goals, infection, bleeding, organ or nerve damage, allergic reactions, paralysis, and death. In addition, the patient was informed of those risks and complications associated to the procedure, such as failure to decrease pain; infection; bleeding; organ or nerve damage with subsequent damage to sensory, motor, and/or autonomic systems, resulting in permanent pain, numbness, and/or weakness of one or several areas of the body; allergic reactions; (i.e.: anaphylactic reaction); and/or death. Furthermore, the patient was informed of those risks and complications associated with the medications. These include, but are not limited to: allergic reactions (i.e.: anaphylactic or anaphylactoid reaction(s)); adrenal axis suppression; blood sugar elevation that in diabetics may result in ketoacidosis or comma; water retention that in patients with history of congestive heart failure may result in shortness of breath, pulmonary edema, and decompensation with resultant heart failure; weight gain; swelling or edema; medication-induced neural toxicity; particulate  matter embolism and blood vessel occlusion with resultant organ, and/or nervous system infarction; and/or aseptic necrosis of one or more joints. Finally, the patient was informed that Medicine  is not an Chief Strategy Officer; therefore, there is also the possibility of unforeseen or unpredictable risks and/or possible complications that may result in a catastrophic outcome. The patient indicated having understood very clearly. We have given the patient no guarantees and we have made no promises. Enough time was given to the patient to ask questions, all of which were answered to the patient's satisfaction. Ann Spencer has indicated that she wanted to continue with the procedure.  Consent Attestation: I, the ordering provider, attest that I have discussed with the patient the benefits, risks, side-effects, alternatives, likelihood of achieving goals, and potential problems during recovery for the procedure that I have provided informed consent.  Pre-Procedure Preparation:  Safety Precautions: Allergies reviewed. The patient was asked about blood thinners, or active infections, both of which were denied. The patient was asked to confirm the procedure and laterality, before marking the site, and again before commencing the procedure. Appropriate site, procedure, and patient were confirmed by following the Joint Commission's Universal Protocol (UP.01.01.01), in the form of a "Time Out". The patient was asked to participate by confirming the accuracy of the "Time Out" information. Patient was assessed for positional comfort and pressure points before starting the procedure. Allergies: She is allergic to penicillins. Allergy Precautions: None required Infection Control Precautions: Sterile technique used. Standard Universal Precautions were taken as recommended by the Department of [facility] for Disease Control and Prevention (CDC). Standard pre-surgical skin prep was conducted. Respiratory hygiene and  cough etiquette was practiced. Hand hygiene observed. Safe injection practices and needle disposal techniques followed. SDV (single dose vial) medications used. Medications properly checked for expiration dates and contaminants. Personal protective equipment (PPE) used as per protocol. Monitoring:  As per clinic protocol. Vitals:   11/09/16 0941 11/09/16 1036  BP: (!) 149/88 (!) 147/74  Pulse: 77 69  Resp: 16 16  Temp:  97.7 F (36.5 C)  TempSrc:  Oral  SpO2: 95% 100%  Weight: 224 lb (101.6 kg)   Height: 5\' 6"  (1.676 m)   Calculated BMI: Body mass index is 36.15 kg/m. Time-out: "Time-out" completed before starting procedure, as per protocol.  Description of Procedure Process:   Time-out: "Time-out" completed before starting procedure, as per protocol. Position: Sitting Target Area: Knee Joint Approach: Just above the Lateral tibial plateau, lateral to the infrapatellar tendon. Area Prepped: Entire knee area, from the mid-thigh to the mid-shin. Prepping solution: ChloraPrep (2% chlorhexidine gluconate and 70% isopropyl alcohol) Safety Precautions: Aspiration looking for blood return was conducted prior to all injections. At no point did we inject any substances, as a needle was being advanced. No attempts were made at seeking any paresthesias. Safe injection practices and needle disposal techniques used. Medications properly checked for expiration dates. SDV (single dose vial) medications used. Description of the Procedure: Protocol guidelines were followed. The patient was placed in position over the fluoroscopy table. The target area was identified and the area prepped in the usual manner. Skin desensitized using vapocoolant spray. Skin & deeper tissues infiltrated with local anesthetic. Appropriate amount of time allowed to pass for local anesthetics to take effect. The procedure needles were then advanced to the target area. Proper needle placement secured. Negative aspiration confirmed.  Solution injected in intermittent fashion, asking for systemic symptoms every 0.5cc of injectate. The needles were then removed and the area cleansed, making sure to leave some of the prepping solution back to take advantage of its long term bactericidal properties. EBL: None Materials & Medications Used:  Needle(s) Used:  22g - 1.5" Needle(s) Medications Administered today: Please see chart for details.  Imaging Guidance:  Type of Imaging Technique: None used Indication(s): N/A Exposure Time: No patient exposure Contrast: None used. Fluoroscopic Guidance: N/A Ultrasound Guidance: N/A Interpretation: N/A  Antibiotic Prophylaxis:  Indication(s): No indications identified. Type:  Antibiotics Given (last 72 hours)    None      Post-operative Assessment:  Complications: No immediate post-treatment complications observed by team, or reported by patient. Disposition: The patient tolerated the entire procedure well. A repeat set of vitals were taken after the procedure and the patient was kept under observation following institutional policy, for this type of procedure. Post-procedural neurological assessment was performed, showing return to baseline, prior to discharge. The patient was provided with post-procedure discharge instructions, including a section on how to identify potential problems. Should any problems arise concerning this procedure, the patient was given instructions to immediately contact us, at any time, without hesitation. In any case, we plan to contact the patient by telephone for a follow-up status report regarding this interventional procedure. Comments:  No additional relevant information.  Plan of Care  Discharge to: Discharge home  Medications ordered for procedure: Meds ordered this encounter  Medications  . Sodium Hyaluronate SOSY 2 mL  . lidocaine (PF) (XYLOCAINE) 1 % injection 10 mL  . ropivacaine (PF) 2 mg/mL (0.2%) (NAROPIN) injection 9 mL   Medications  administered: (For more details, see medical record) We administered Sodium Hyaluronate and lidocaine (PF). Lab-work, Procedure(s), & Referral(s) Ordered: Orders Placed This Encounter  Procedures  . KNEE INJECTION  . KNEE INJECTION   Imaging Ordered: No results found for this or any previous visit. New Prescriptions   No medications on file   Physician-requested Follow-up:  Return in about 2 weeks (around 11/23/2016) for procedure Hyalgan knee injection #4.  Future Appointments Date Time Provider Newman  11/23/2016 9:30 AM Milinda Pointer, MD [facility] None  12/27/2016 10:45 AM Leone Haven, MD [facility] None   Primary Care Physician: Leone Haven, MD Location: [facility] Note by: Kathlen Brunswick. Dossie Arbour, M.D, DABA, DABAPM, DABPM, DABIPP, FIPP  Disclaimer:  Medicine is not an exact science. The only guarantee in medicine is that nothing is guaranteed. It is important to note that the decision to proceed with this intervention was based on the information collected from the patient. The Data and conclusions were drawn from the patient's questionnaire, the interview, and the physical examination. Because the information was provided in large part by the patient, it cannot be guaranteed that it has not been purposely or unconsciously manipulated. Every effort has been made to obtain as much relevant data as possible for this evaluation. It is important to note that the conclusions that lead to this procedure are derived in large part from the available data. Always take into account that the treatment will also be dependent on availability of resources and existing treatment guidelines, considered by other Pain Management Practitioners as being common knowledge and practice, at the time of the intervention. For Medico-Legal purposes, it is also important to point out that variation in procedural techniques and pharmacological choices are the  acceptable norm. The indications, contraindications, technique, and results of the above procedure should only be interpreted and judged by a Board-Certified Interventional Pain Specialist with extensive familiarity and expertise in the same exact procedure and technique. Attempts at providing opinions without similar or greater experience and expertise than that of the treating physician will be considered as inappropriate  and unethical, and shall result in a formal complaint to the state medical board and applicable specialty societies.  Instructions provided at this appointment: Patient Instructions  Pain Management Discharge Instructions  General Discharge Instructions :  If you need to reach your doctor call: Monday-Friday 8:00 am - 4:00 pm at [phone] or toll free [phone].  After clinic hours [phone] to have operator reach doctor.  Bring all of your medication bottles to all your appointments in the pain clinic.  To cancel or reschedule your appointment with Pain Management please remember to call 24 hours in advance to avoid a fee.  Refer to the educational materials which you have been given on: General Risks, I had my Procedure. Discharge Instructions, Post Sedation.  Post Procedure Instructions:  The drugs you were given will stay in your system until tomorrow, so for the next 24 hours you should not drive, make any legal decisions or drink any alcoholic beverages.  You may eat anything you prefer, but it is better to start with liquids then soups and crackers, and gradually work up to solid foods.  Please notify your doctor immediately if you have any unusual bleeding, trouble breathing or pain that is not related to your normal pain.  Depending on the type of procedure that was done, some parts of your body may feel week and/or numb.  This usually clears up by tonight or the next day.  Walk with the use of an assistive device or accompanied by an adult for the 24  hours.  You may use ice on the affected area for the first 24 hours.  Put ice in a Ziploc bag and cover with a towel and place against area 15 minutes on 15 minutes off.  You may switch to heat after 24 hours.Knee Injection A knee injection is a procedure to get medicine into your knee joint. Your health care provider puts a needle into the joint and injects medicine with an attached syringe. The injected medicine may relieve the pain, swelling, and stiffness of arthritis. The injected medicine may also help to lubricate and cushion your knee joint. You may need more than one injection. Tell a health care provider about:  Any allergies you have.  All medicines you are taking, including vitamins, herbs, eye drops, creams, and over-the-counter medicines.  Any problems you or family members have had with anesthetic medicines.  Any blood disorders you have.  Any surgeries you have had.  Any medical conditions you have. What are the risks? Generally, this is a safe procedure. However, problems may occur, including:  Infection.  Bleeding.  Worsening symptoms.  Damage to the area around your knee.  Allergic reaction to any of the medicines.  Skin reactions from repeated injections. What happens before the procedure?  Ask your health care provider about changing or stopping your regular medicines. This is especially important if you are taking diabetes medicines or blood thinners.  Plan to have someone take you home after the procedure. What happens during the procedure?  You will sit or lie down in a position for your knee to be treated.  The skin over your kneecap will be cleaned with a germ-killing solution (antiseptic).  You will be given a medicine that numbs the area (local anesthetic). You may feel some stinging.  After your knee becomes numb, you will have a second injection. This is the medicine. This needle is carefully placed between your kneecap and your knee. The  medicine is injected into the joint  space.  At the end of the procedure, the needle will be removed.  A bandage (dressing) may be placed over the injection site. The procedure may vary among health care providers and hospitals. What happens after the procedure?  You may have to move your knee through its full range of motion. This helps to get all of the medicine into your joint space.  Your blood pressure, heart rate, breathing rate, and blood oxygen level will be monitored often until the medicines you were given have worn off.  You will be watched to make sure that you do not have a reaction to the injected medicine. This information is not intended to replace advice given to you by your health care provider. Make sure you discuss any questions you have with your health care provider. Document Released: 02/19/2007 Document Revised: 04/29/2016 Document Reviewed: 10/08/2014 Elsevier Interactive Patient Education  2017 [employer].

## 2016-11-10 ENCOUNTER — Telehealth: Payer: Self-pay | Admitting: *Deleted

## 2016-11-10 NOTE — Telephone Encounter (Signed)
Denies problems post procedure. 

## 2016-11-23 ENCOUNTER — Ambulatory Visit: Payer: Medicare Other | Attending: Pain Medicine | Admitting: Pain Medicine

## 2016-11-23 ENCOUNTER — Encounter: Payer: Self-pay | Admitting: Pain Medicine

## 2016-11-23 VITALS — BP 145/82 | HR 84 | Temp 97.6°F | Resp 16 | Ht 66.0 in | Wt 224.0 lb

## 2016-11-23 DIAGNOSIS — M25562 Pain in left knee: Secondary | ICD-10-CM

## 2016-11-23 DIAGNOSIS — Z90710 Acquired absence of both cervix and uterus: Secondary | ICD-10-CM | POA: Insufficient documentation

## 2016-11-23 DIAGNOSIS — M25561 Pain in right knee: Secondary | ICD-10-CM | POA: Diagnosis not present

## 2016-11-23 DIAGNOSIS — Z880 Allergy status to penicillin: Secondary | ICD-10-CM | POA: Diagnosis not present

## 2016-11-23 DIAGNOSIS — M1712 Unilateral primary osteoarthritis, left knee: Secondary | ICD-10-CM

## 2016-11-23 DIAGNOSIS — G8929 Other chronic pain: Secondary | ICD-10-CM | POA: Diagnosis not present

## 2016-11-23 MED ORDER — SODIUM HYALURONATE (VISCOSUP) 20 MG/2ML IX SOSY
2.0000 mL | PREFILLED_SYRINGE | Freq: Once | INTRA_ARTICULAR | Status: AC
  Administered 2016-11-23: 2 mL via INTRA_ARTICULAR

## 2016-11-23 MED ORDER — LIDOCAINE HCL (PF) 1 % IJ SOLN
10.0000 mL | Freq: Once | INTRAMUSCULAR | Status: AC
  Administered 2016-11-23: 10 mL

## 2016-11-23 MED ORDER — ROPIVACAINE HCL 2 MG/ML IJ SOLN
4.0000 mL | Freq: Once | INTRAMUSCULAR | Status: AC
  Administered 2016-11-23: 4 mL

## 2016-11-23 MED ORDER — LIDOCAINE HCL (PF) 1 % IJ SOLN
INTRAMUSCULAR | Status: AC
  Administered 2016-11-23: 11:00:00
  Filled 2016-11-23: qty 5

## 2016-11-23 NOTE — Progress Notes (Signed)
Patient's Name: Ann Spencer  MRN: [id number]  Referring Provider: Milinda Pointer, MD  DOB: 30-May-1941  PCP: Leone Haven, MD  DOS: 11/23/2016  Note by: Kathlen Brunswick. Dossie Arbour, MD  Service setting: Ambulatory outpatient  Location: [facility] (AMB) Pain Management Facility  Visit type: Procedure  Specialty: Interventional Pain Management  Patient type: Established   Primary Reason for Visit: Interventional Pain Management Treatment. CC: Knee Pain (right)  Procedure:  Anesthesia, Analgesia, Anxiolysis:  Type: Therapeutic Intra-Articular Hyalgan Knee Injection #4 Region: Lateral infrapatellar Knee Region Level: Knee Joint Laterality: Left knee  Type: Local Anesthesia Local Anesthetic: Lidocaine 1% Route: Infiltration (Crossgate/IM) IV Access: Declined Sedation: Declined  Indication(s): Analgesia          Indications: 1. Osteoarthritis of knee (Left)   2. Tricompartment osteoarthritis of left knee   3. Chronic knee pain (Bilateral) (L>R)    Pain Score: Pre-procedure: 0-No pain/10 Post-procedure: 0-No pain/10  Pre-Procedure Assessment:  Ann Spencer is a 75 y.o. (year old), female patient, seen today for interventional treatment. She  has a past surgical history that includes vein closure (Bilateral, Feb 2008); Abdominal hysterectomy (1981); and Shoulder surgery (Left, 03/03/15).. Her primarily concern today is the Knee Pain (right) The primary encounter diagnosis was Osteoarthritis of knee (Left). Diagnoses of Tricompartment osteoarthritis of left knee and Chronic knee pain (Bilateral) (L>R) were also pertinent to this visit.  Pain Type: Chronic pain Pain Location: Knee Pain Orientation: Right Pain Descriptors / Indicators: Sore Pain Frequency: Constant    Service Provided on Last Visit: Procedure  Coagulation Parameters Lab Results  Component Value Date   PLT 191.0 02/20/2015   Verification of the correct person, correct site (including marking of site), and correct procedure were  performed and confirmed by the patient.  Consent: Before the procedure and under the influence of no sedative(s), amnesic(s), or anxiolytics, the patient was informed of the treatment options, risks and possible complications. To fulfill our ethical and legal obligations, as recommended by the American Medical Association's Code of Ethics, I have informed the patient of my clinical impression; the nature and purpose of the treatment or procedure; the risks, benefits, and possible complications of the intervention; the alternatives, including doing nothing; the risk(s) and benefit(s) of the alternative treatment(s) or procedure(s); and the risk(s) and benefit(s) of doing nothing. The patient was provided information about the general risks and possible complications associated with the procedure. These may include, but are not limited to: failure to achieve desired goals, infection, bleeding, organ or nerve damage, allergic reactions, paralysis, and death. In addition, the patient was informed of those risks and complications associated to the procedure, such as failure to decrease pain; infection; bleeding; organ or nerve damage with subsequent damage to sensory, motor, and/or autonomic systems, resulting in permanent pain, numbness, and/or weakness of one or several areas of the body; allergic reactions; (i.e.: anaphylactic reaction); and/or death. Furthermore, the patient was informed of those risks and complications associated with the medications. These include, but are not limited to: allergic reactions (i.e.: anaphylactic or anaphylactoid reaction(s)); adrenal axis suppression; blood sugar elevation that in diabetics may result in ketoacidosis or comma; water retention that in patients with history of congestive heart failure may result in shortness of breath, pulmonary edema, and decompensation with resultant heart failure; weight gain; swelling or edema; medication-induced neural toxicity; particulate  matter embolism and blood vessel occlusion with resultant organ, and/or nervous system infarction; and/or aseptic necrosis of one or more joints. Finally, the patient was informed that Medicine  is not an Chief Strategy Officer; therefore, there is also the possibility of unforeseen or unpredictable risks and/or possible complications that may result in a catastrophic outcome. The patient indicated having understood very clearly. We have given the patient no guarantees and we have made no promises. Enough time was given to the patient to ask questions, all of which were answered to the patient's satisfaction. Ann Spencer has indicated that she wanted to continue with the procedure.  Consent Attestation: I, the ordering provider, attest that I have discussed with the patient the benefits, risks, side-effects, alternatives, likelihood of achieving goals, and potential problems during recovery for the procedure that I have provided informed consent.  Pre-Procedure Preparation:  Safety Precautions: Allergies reviewed. The patient was asked about blood thinners, or active infections, both of which were denied. The patient was asked to confirm the procedure and laterality, before marking the site, and again before commencing the procedure. Appropriate site, procedure, and patient were confirmed by following the Joint Commission's Universal Protocol (UP.01.01.01), in the form of a "Time Out". The patient was asked to participate by confirming the accuracy of the "Time Out" information. Patient was assessed for positional comfort and pressure points before starting the procedure. Allergies: She is allergic to penicillins. Allergy Precautions: None required Infection Control Precautions: Sterile technique used. Standard Universal Precautions were taken as recommended by the Department of [facility] for Disease Control and Prevention (CDC). Standard pre-surgical skin prep was conducted. Respiratory hygiene and  cough etiquette was practiced. Hand hygiene observed. Safe injection practices and needle disposal techniques followed. SDV (single dose vial) medications used. Medications properly checked for expiration dates and contaminants. Personal protective equipment (PPE) used as per protocol. Monitoring:  As per clinic protocol. Vitals:   11/23/16 1002  BP: (!) 145/82  Pulse: 84  Resp: 16  Temp: 97.6 F (36.4 C)  TempSrc: Oral  SpO2: 96%  Weight: 224 lb (101.6 kg)  Height: 5\' 6"  (1.676 m)  Calculated BMI: Body mass index is 36.15 kg/m. Time-out: "Time-out" completed before starting procedure, as per protocol.  Description of Procedure Process:   Time-out: "Time-out" completed before starting procedure, as per protocol. Position: Sitting Target Area: Knee Joint Approach: Just above the Lateral tibial plateau, lateral to the infrapatellar tendon. Area Prepped: Entire knee area, from the mid-thigh to the mid-shin. Prepping solution: ChloraPrep (2% chlorhexidine gluconate and 70% isopropyl alcohol) Safety Precautions: Aspiration looking for blood return was conducted prior to all injections. At no point did we inject any substances, as a needle was being advanced. No attempts were made at seeking any paresthesias. Safe injection practices and needle disposal techniques used. Medications properly checked for expiration dates. SDV (single dose vial) medications used. Description of the Procedure: Protocol guidelines were followed. The patient was placed in position over the fluoroscopy table. The target area was identified and the area prepped in the usual manner. Skin desensitized using vapocoolant spray. Skin & deeper tissues infiltrated with local anesthetic. Appropriate amount of time allowed to pass for local anesthetics to take effect. The procedure needles were then advanced to the target area. Proper needle placement secured. Negative aspiration confirmed. Solution injected in intermittent  fashion, asking for systemic symptoms every 0.5cc of injectate. The needles were then removed and the area cleansed, making sure to leave some of the prepping solution back to take advantage of its long term bactericidal properties. EBL: None Materials & Medications Used:  Needle(s) Used: 22g - 1.5" Needle(s) Medications Administered today: Please see chart for  details.  Imaging Guidance:  Type of Imaging Technique: None used Indication(s): N/A Exposure Time: No patient exposure Contrast: None used. Fluoroscopic Guidance: N/A Ultrasound Guidance: N/A Interpretation: N/A  Antibiotic Prophylaxis:  Indication(s): No indications identified. Type:  Antibiotics Given (last 72 hours)    None      Post-operative Assessment:  Complications: No immediate post-treatment complications observed by team, or reported by patient. Disposition: The patient tolerated the entire procedure well. A repeat set of vitals were taken after the procedure and the patient was kept under observation following institutional policy, for this type of procedure. Post-procedural neurological assessment was performed, showing return to baseline, prior to discharge. The patient was provided with post-procedure discharge instructions, including a section on how to identify potential problems. Should any problems arise concerning this procedure, the patient was given instructions to immediately contact us, at any time, without hesitation. In any case, we plan to contact the patient by telephone for a follow-up status report regarding this interventional procedure. Comments:  No additional relevant information.  Plan of Care  Discharge to: Discharge home  Medications ordered for procedure: Meds ordered this encounter  Medications  . Sodium Hyaluronate SOSY 2 mL  . lidocaine (PF) (XYLOCAINE) 1 % injection 10 mL  . ropivacaine (PF) 2 mg/mL (0.2%) (NAROPIN) injection 4 mL  . lidocaine (PF) (XYLOCAINE) 1 % injection     GARNER, CYNTHIA: cabinet override   Medications administered: (For more details, see medical record) We administered Sodium Hyaluronate, lidocaine (PF), ropivacaine (PF) 2 mg/mL (0.2%), and lidocaine (PF). Lab-work, Procedure(s), & Referral(s) Ordered: Orders Placed This Encounter  Procedures  . KNEE INJECTION   Imaging Ordered: No results found for this or any previous visit. New Prescriptions   No medications on file   Physician-requested Follow-up:  Return in about 2 weeks (around 12/07/2016) for Post-Procedure evaluation, in addition, procedure: Left Hyalgan No. 5.  Future Appointments Date Time Provider [place]  12/14/2016 9:45 AM Milinda Pointer, MD [facility] None  12/27/2016 10:45 AM Leone Haven, MD [facility] None  01/11/2017 1:15 PM Milinda Pointer, MD [facility] None   Primary Care Physician: Leone Haven, MD Location: [facility] Note by: Kathlen Brunswick. Dossie Arbour, M.D, DABA, DABAPM, DABPM, DABIPP, FIPP Date: 11/23/16; Time: 3:40 PM  Disclaimer:  Medicine is not an exact science. The only guarantee in medicine is that nothing is guaranteed. It is important to note that the decision to proceed with this intervention was based on the information collected from the patient. The Data and conclusions were drawn from the patient's questionnaire, the interview, and the physical examination. Because the information was provided in large part by the patient, it cannot be guaranteed that it has not been purposely or unconsciously manipulated. Every effort has been made to obtain as much relevant data as possible for this evaluation. It is important to note that the conclusions that lead to this procedure are derived in large part from the available data. Always take into account that the treatment will also be dependent on availability of resources and existing treatment guidelines, considered by other Pain Management Practitioners as being  common knowledge and practice, at the time of the intervention. For Medico-Legal purposes, it is also important to point out that variation in procedural techniques and pharmacological choices are the acceptable norm. The indications, contraindications, technique, and results of the above procedure should only be interpreted and judged by a Board-Certified Interventional Pain Specialist with extensive familiarity and expertise in the same exact  procedure and technique. Attempts at providing opinions without similar or greater experience and expertise than that of the treating physician will be considered as inappropriate and unethical, and shall result in a formal complaint to the state medical board and applicable specialty societies.  Instructions provided at this appointment: There are no Patient Instructions on file for this visit.

## 2016-11-23 NOTE — Progress Notes (Signed)
Safety precautions to be maintained throughout the outpatient stay will include: orient to surroundings, keep bed in low position, maintain call bell within reach at all times, provide assistance with transfer out of bed and ambulation.  

## 2016-11-24 NOTE — Telephone Encounter (Signed)
States she is doing well - no jpain after numbness wore off. Doing well.

## 2016-12-14 ENCOUNTER — Ambulatory Visit: Payer: Medicare Other | Attending: Pain Medicine | Admitting: Pain Medicine

## 2016-12-14 ENCOUNTER — Encounter: Payer: Self-pay | Admitting: Pain Medicine

## 2016-12-14 VITALS — BP 127/84 | HR 89 | Temp 98.2°F | Resp 16 | Ht 65.0 in | Wt 222.0 lb

## 2016-12-14 DIAGNOSIS — M2392 Unspecified internal derangement of left knee: Secondary | ICD-10-CM | POA: Diagnosis not present

## 2016-12-14 DIAGNOSIS — M1712 Unilateral primary osteoarthritis, left knee: Secondary | ICD-10-CM | POA: Diagnosis not present

## 2016-12-14 DIAGNOSIS — Z880 Allergy status to penicillin: Secondary | ICD-10-CM | POA: Insufficient documentation

## 2016-12-14 MED ORDER — SODIUM HYALURONATE (VISCOSUP) 20 MG/2ML IX SOSY
2.0000 mL | PREFILLED_SYRINGE | Freq: Once | INTRA_ARTICULAR | Status: AC
  Administered 2016-12-14: 2 mL via INTRA_ARTICULAR
  Filled 2016-12-14: qty 2

## 2016-12-14 MED ORDER — LIDOCAINE HCL (PF) 1 % IJ SOLN: 10.0000 mL | Freq: Once | INTRAMUSCULAR | Status: AC

## 2016-12-14 MED ORDER — LIDOCAINE HCL (PF) 1 % IJ SOLN
INTRAMUSCULAR | Status: AC
  Administered 2016-12-14: 11:00:00
  Filled 2016-12-14: qty 5

## 2016-12-14 NOTE — Patient Instructions (Signed)
Apply ice today, 15 minutes on, 15 minutes off. Apply heat tomorrow.

## 2016-12-14 NOTE — Progress Notes (Signed)
Safety precautions to be maintained throughout the outpatient stay will include: orient to surroundings, keep bed in low position, maintain call bell within reach at all times, provide assistance with transfer out of bed and ambulation.  

## 2016-12-14 NOTE — Progress Notes (Signed)
Patient's Name: Ann Spencer  MRN: [id number]  Referring Provider: Leone Haven, MD  DOB: 1941/11/12  PCP: Leone Haven, MD  DOS: 12/14/2016  Note by: Kathlen Brunswick. Dossie Arbour, MD  Service setting: Ambulatory outpatient  Location: [facility] (AMB) Pain Management Facility  Visit type: Procedure  Specialty: Interventional Pain Management  Patient type: Established   Primary Reason for Visit: Interventional Pain Management Treatment. CC: Knee Pain (left)  Procedure:  Anesthesia, Analgesia, Anxiolysis:  Type: Therapeutic Intra-Articular Hyalgan #5 Knee Injection Region: Lateral infrapatellar Knee Region Level: Knee Joint Laterality: Left knee  Type: Local Anesthesia Local Anesthetic: Lidocaine 1% Route: Infiltration ([place]/IM) IV Access: Declined Sedation: Declined  Indication(s): Analgesia          Indications: 1. Osteoarthritis of knee (Left)   2. Knee derangement (Left)   3. Tricompartment osteoarthritis of knee (Left)    Pain Score: Pre-procedure: 0-No pain/10 Post-procedure: 0-No pain/10  Pre-Procedure Assessment:  Ms. Mcbreairty is a 76 y.o. (year old), female patient, seen today for interventional treatment. She  has a past surgical history that includes vein closure (Bilateral, Feb 2008); Abdominal hysterectomy (1981); and Shoulder surgery (Left, 03/03/15).. Her primarily concern today is the Knee Pain (left) The primary encounter diagnosis was Osteoarthritis of knee (Left). Diagnoses of Knee derangement (Left) and Tricompartment osteoarthritis of knee (Left) were also pertinent to this visit.  Pain Type: Chronic pain Pain Location: Knee Pain Orientation: Left Pain Descriptors / Indicators: Sore Pain Frequency: Intermittent  Date of Last Visit: 11/23/16 Service Provided on Last Visit: Procedure  Coagulation Parameters Lab Results  Component Value Date   PLT 191.0 02/20/2015   Verification of the correct person, correct site (including marking of site), and correct  procedure were performed and confirmed by the patient.  Consent: Before the procedure and under the influence of no sedative(s), amnesic(s), or anxiolytics, the patient was informed of the treatment options, risks and possible complications. To fulfill our ethical and legal obligations, as recommended by the American Medical Association's Code of Ethics, I have informed the patient of my clinical impression; the nature and purpose of the treatment or procedure; the risks, benefits, and possible complications of the intervention; the alternatives, including doing nothing; the risk(s) and benefit(s) of the alternative treatment(s) or procedure(s); and the risk(s) and benefit(s) of doing nothing. The patient was provided information about the general risks and possible complications associated with the procedure. These may include, but are not limited to: failure to achieve desired goals, infection, bleeding, organ or nerve damage, allergic reactions, paralysis, and death. In addition, the patient was informed of those risks and complications associated to the procedure, such as failure to decrease pain; infection; bleeding; organ or nerve damage with subsequent damage to sensory, motor, and/or autonomic systems, resulting in permanent pain, numbness, and/or weakness of one or several areas of the body; allergic reactions; (i.e.: anaphylactic reaction); and/or death. Furthermore, the patient was informed of those risks and complications associated with the medications. These include, but are not limited to: allergic reactions (i.e.: anaphylactic or anaphylactoid reaction(s)); adrenal axis suppression; blood sugar elevation that in diabetics may result in ketoacidosis or comma; water retention that in patients with history of congestive heart failure may result in shortness of breath, pulmonary edema, and decompensation with resultant heart failure; weight gain; swelling or edema; medication-induced neural  toxicity; particulate matter embolism and blood vessel occlusion with resultant organ, and/or nervous system infarction; and/or aseptic necrosis of one or more joints. Finally, the patient was informed that Medicine  is not an Chief Strategy Officer; therefore, there is also the possibility of unforeseen or unpredictable risks and/or possible complications that may result in a catastrophic outcome. The patient indicated having understood very clearly. We have given the patient no guarantees and we have made no promises. Enough time was given to the patient to ask questions, all of which were answered to the patient's satisfaction. Ms. Relph has indicated that she wanted to continue with the procedure.  Consent Attestation: I, the ordering provider, attest that I have discussed with the patient the benefits, risks, side-effects, alternatives, likelihood of achieving goals, and potential problems during recovery for the procedure that I have provided informed consent.  Pre-Procedure Preparation:  Safety Precautions: Allergies reviewed. The patient was asked about blood thinners, or active infections, both of which were denied. The patient was asked to confirm the procedure and laterality, before marking the site, and again before commencing the procedure. Appropriate site, procedure, and patient were confirmed by following the Joint Commission's Universal Protocol (UP.01.01.01), in the form of a "Time Out". The patient was asked to participate by confirming the accuracy of the "Time Out" information. Patient was assessed for positional comfort and pressure points before starting the procedure. Allergies: She is allergic to penicillins. Allergy Precautions: None required Infection Control Precautions: Sterile technique used. Standard Universal Precautions were taken as recommended by the Department of [facility] for Disease Control and Prevention (CDC). Standard pre-surgical skin prep was conducted.  Respiratory hygiene and cough etiquette was practiced. Hand hygiene observed. Safe injection practices and needle disposal techniques followed. SDV (single dose vial) medications used. Medications properly checked for expiration dates and contaminants. Personal protective equipment (PPE) used as per protocol. Monitoring:  As per clinic protocol. Vitals:   12/14/16 0940 12/14/16 1028  BP: (!) 156/81 127/84  Pulse: 94 89  Resp: 18 16  Temp: 98.2 F (36.8 C)   TempSrc: Oral   SpO2: 97% 99%  Weight: 222 lb (100.7 kg)   Height: 5\' 5"  (1.651 m)   Calculated BMI: Body mass index is 36.94 kg/m. Time-out: "Time-out" completed before starting procedure, as per protocol.  Description of Procedure Process:   Time-out: "Time-out" completed before starting procedure, as per protocol. Position: Sitting Target Area: Knee Joint Approach: Just above the Lateral tibial plateau, lateral to the infrapatellar tendon. Area Prepped: Entire knee area, from the mid-thigh to the mid-shin. Prepping solution: ChloraPrep (2% chlorhexidine gluconate and 70% isopropyl alcohol) Safety Precautions: Aspiration looking for blood return was conducted prior to all injections. At no point did we inject any substances, as a needle was being advanced. No attempts were made at seeking any paresthesias. Safe injection practices and needle disposal techniques used. Medications properly checked for expiration dates. SDV (single dose vial) medications used. Description of the Procedure: Protocol guidelines were followed. The patient was placed in position over the fluoroscopy table. The target area was identified and the area prepped in the usual manner. Skin desensitized using vapocoolant spray. Skin & deeper tissues infiltrated with local anesthetic. Appropriate amount of time allowed to pass for local anesthetics to take effect. The procedure needles were then advanced to the target area. Proper needle placement secured. Negative  aspiration confirmed. Solution injected in intermittent fashion, asking for systemic symptoms every 0.5cc of injectate. The needles were then removed and the area cleansed, making sure to leave some of the prepping solution back to take advantage of its long term bactericidal properties. EBL: None Materials & Medications Used:  Needle(s) Used: 22g -  1.5" Needle(s) Medications Administered today: Please see chart for details.  Imaging Guidance:  Type of Imaging Technique: None used Indication(s): N/A Exposure Time: No patient exposure Contrast: None used. Fluoroscopic Guidance: N/A Ultrasound Guidance: N/A Interpretation: N/A  Antibiotic Prophylaxis:  Indication(s): No indications identified. Type:  Antibiotics Given (last 72 hours)    None      Post-operative Assessment:  Complications: No immediate post-treatment complications observed by team, or reported by patient. Disposition: The patient tolerated the entire procedure well. A repeat set of vitals were taken after the procedure and the patient was kept under observation following institutional policy, for this type of procedure. Post-procedural neurological assessment was performed, showing return to baseline, prior to discharge. The patient was provided with post-procedure discharge instructions, including a section on how to identify potential problems. Should any problems arise concerning this procedure, the patient was given instructions to immediately contact us, at any time, without hesitation. In any case, we plan to contact the patient by telephone for a follow-up status report regarding this interventional procedure. Comments:  No additional relevant information.  Plan of Care  Discharge to: Discharge home  Medications ordered for procedure: Meds ordered this encounter  Medications  . lidocaine (PF) (XYLOCAINE) 1 % injection    Donneta Romberg, Dena: cabinet override  . Sodium Hyaluronate SOSY 2 mL  . lidocaine (PF)  (XYLOCAINE) 1 % injection 10 mL   Medications administered: (For more details, see medical record) We administered lidocaine (PF) and Sodium Hyaluronate. Lab-work, Procedure(s), & Referral(s) Ordered: Orders Placed This Encounter  Procedures  . KNEE INJECTION   Imaging Ordered: No results found for this or any previous visit. New Prescriptions   No medications on file   Physician-requested Follow-up:  Return if symptoms worsen or fail to improve, for (PRN) procedure.  Future Appointments Date Time Provider [place]  12/27/2016 10:45 AM Leone Haven, MD [facility] None  01/11/2017 1:15 PM Milinda Pointer, MD [facility] None   Primary Care Physician: Leone Haven, MD Location: [facility] Note by: Kathlen Brunswick. Dossie Arbour, M.D, DABA, DABAPM, DABPM, DABIPP, FIPP Date: 12/14/16; Time: 11:38 AM  Disclaimer:  Medicine is not an Chief Strategy Officer. The only guarantee in medicine is that nothing is guaranteed. It is important to note that the decision to proceed with this intervention was based on the information collected from the patient. The Data and conclusions were drawn from the patient's questionnaire, the interview, and the physical examination. Because the information was provided in large part by the patient, it cannot be guaranteed that it has not been purposely or unconsciously manipulated. Every effort has been made to obtain as much relevant data as possible for this evaluation. It is important to note that the conclusions that lead to this procedure are derived in large part from the available data. Always take into account that the treatment will also be dependent on availability of resources and existing treatment guidelines, considered by other Pain Management Practitioners as being common knowledge and practice, at the time of the intervention. For Medico-Legal purposes, it is also important to point out that variation in procedural  techniques and pharmacological choices are the acceptable norm. The indications, contraindications, technique, and results of the above procedure should only be interpreted and judged by a Board-Certified Interventional Pain Specialist with extensive familiarity and expertise in the same exact procedure and technique. Attempts at providing opinions without similar or greater experience and expertise than that of the treating physician will be considered as  inappropriate and unethical, and shall result in a formal complaint to the state medical board and applicable specialty societies.  Instructions provided at this appointment: Patient Instructions  Apply ice today, 15 minutes on, 15 minutes off. Apply heat tomorrow.

## 2016-12-15 ENCOUNTER — Telehealth: Payer: Self-pay | Admitting: *Deleted

## 2016-12-15 NOTE — Telephone Encounter (Signed)
Attempted to call patient for post procedure follow-up. Message left. 

## 2016-12-27 ENCOUNTER — Encounter: Payer: Self-pay | Admitting: Family Medicine

## 2016-12-27 ENCOUNTER — Ambulatory Visit (INDEPENDENT_AMBULATORY_CARE_PROVIDER_SITE_OTHER): Payer: Medicare Other | Admitting: Family Medicine

## 2016-12-27 DIAGNOSIS — J069 Acute upper respiratory infection, unspecified: Secondary | ICD-10-CM | POA: Diagnosis not present

## 2016-12-27 DIAGNOSIS — K219 Gastro-esophageal reflux disease without esophagitis: Secondary | ICD-10-CM

## 2016-12-27 DIAGNOSIS — I10 Essential (primary) hypertension: Secondary | ICD-10-CM

## 2016-12-27 DIAGNOSIS — M1712 Unilateral primary osteoarthritis, left knee: Secondary | ICD-10-CM

## 2016-12-27 DIAGNOSIS — B9789 Other viral agents as the cause of diseases classified elsewhere: Secondary | ICD-10-CM

## 2016-12-27 MED ORDER — AMLODIPINE BESYLATE 5 MG PO TABS: 5.0000 mg | ORAL_TABLET | Freq: Every day | ORAL | 3 refills | Status: DC

## 2016-12-27 NOTE — Assessment & Plan Note (Signed)
Symptoms of upper respiratory infection had been improving. Mild cough now. Vital signs stable. Lungs sounds clear. She'll monitor for continued improvement.

## 2016-12-27 NOTE — Assessment & Plan Note (Signed)
Status post viscous supplementation. She has appeared to improve with this. She'll monitor her symptoms and if they start to worsen could consider orthopedic referral for consideration of knee replacement.

## 2016-12-27 NOTE — Patient Instructions (Signed)
Nice to see you. Please monitor her cold symptoms and make sure they continue to improve. If they do not please let us know. You can return next week for nurse visit for flu vaccination. We will start you on amlodipine for your BP. We will have you return in 2-3 weeks for recheck of BP.  Please monitor for recurrence of your reflux symptoms. If they recur please let us know.

## 2016-12-27 NOTE — Progress Notes (Signed)
  Tommi Rumps, MD Phone: [phone]  Ann Spencer is a 76 y.o. female who presents today for f/u.   Hypertension: Patient is checking at home and is typically 140-160 over upper 80s. Not taking any medications currently. No chest pain, shortness breath, claudication, or edema. She has been watching her diet and tries to stay active though has difficulty with this given her osteoarthritis in her knees.  Osteoarthritis: Left knee greater than right knee. Has been getting viscous supplementation in her knee on the left side which has helped. Just some mild soreness now. The next step is knee replacement.  GERD: No longer on Prilosec. Since she has come off of this she's not had any recurrence of her symptoms. No blood in her stool. She's been watching what she eats and this is helped as well.  Has been getting over a cold. Minimal cough currently. Nonproductive. Breathing well. Did start with some sore throat and upper respirator symptoms though these have resolved. Symptoms significantly improved.  PMH: Former smoker   ROS see history of present illness  Objective  Physical Exam Vitals:   12/27/16 1106  BP: 126/76  Pulse: 76  Temp: 97.9 F (36.6 C)    BP Readings from Last 3 Encounters:  12/27/16 126/76  12/14/16 127/84  11/23/16 (!) 145/82   Wt Readings from Last 3 Encounters:  12/27/16 223 lb (101.2 kg)  12/14/16 222 lb (100.7 kg)  11/23/16 224 lb (101.6 kg)    Physical Exam  Constitutional: No distress.  Cardiovascular: Normal rate, regular rhythm and normal heart sounds.   Pulmonary/Chest: Effort normal and breath sounds normal.  Musculoskeletal:  Left knee with minimal soft tissue swelling, nontender, no ligamentous laxity, negative McMurray's, no erythema or warmth, right knee with no swelling, warmth, or erythema, no ligamentous laxity, negative McMurray's  Neurological: She is alert. Gait normal.  Skin: Skin is warm and dry. She is not diaphoretic.      Assessment/Plan: Please see individual problem list.  Essential hypertension Slightly above goal at home. We'll place on amlodipine. Recheck in 2-3 weeks with the nurse visit.  URI (upper respiratory infection) Symptoms of upper respiratory infection had been improving. Mild cough now. Vital signs stable. Lungs sounds clear. She'll monitor for continued improvement.  GERD (gastroesophageal reflux disease) Asymptomatic. Continue to monitor. If symptoms return she will let us know.  Osteoarthritis of knee (Left) Status post viscous supplementation. She has appeared to improve with this. She'll monitor her symptoms and if they start to worsen could consider orthopedic referral for consideration of knee replacement.   No orders of the defined types were placed in this encounter.   Meds ordered this encounter  Medications  . amLODipine (NORVASC) 5 MG tablet    Sig: Take 1 tablet (5 mg total) by mouth daily.    Dispense:  90 tablet    Refill:  [place], MD [place]

## 2016-12-27 NOTE — Assessment & Plan Note (Signed)
Asymptomatic. Continue to monitor. If symptoms return she will let us know.

## 2016-12-27 NOTE — Assessment & Plan Note (Signed)
Slightly above goal at home. We'll place on amlodipine. Recheck in 2-3 weeks with the nurse visit.

## 2017-01-10 ENCOUNTER — Ambulatory Visit (INDEPENDENT_AMBULATORY_CARE_PROVIDER_SITE_OTHER): Payer: Medicare Other

## 2017-01-10 VITALS — BP 118/84 | HR 94 | Resp 16

## 2017-01-10 DIAGNOSIS — I10 Essential (primary) hypertension: Secondary | ICD-10-CM

## 2017-01-10 DIAGNOSIS — Z23 Encounter for immunization: Secondary | ICD-10-CM

## 2017-01-10 NOTE — Progress Notes (Signed)
Blood pressure is well-controlled. She should continue her current medication regimen.  Tommi Rumps, M.D.

## 2017-01-10 NOTE — Progress Notes (Signed)
Patient comes in for 2- 3 week blood pressure check.   She is taking Amlodipine.   Also wants to get flu injection today.  Injected right deltoid .  Patient tolerated injection well.

## 2017-01-11 ENCOUNTER — Ambulatory Visit: Payer: Medicare Other | Admitting: Pain Medicine

## 2017-01-12 NOTE — Progress Notes (Signed)
Patient advised and verbalized and understanding .

## 2017-02-13 ENCOUNTER — Telehealth: Payer: Self-pay | Admitting: Family Medicine

## 2017-02-13 NOTE — Telephone Encounter (Signed)
Left pt message asking to call Allison back directly at [phone] to schedule AWV. Thanks! °

## 2017-03-27 ENCOUNTER — Encounter: Payer: Self-pay | Admitting: Family Medicine

## 2017-03-27 ENCOUNTER — Ambulatory Visit (INDEPENDENT_AMBULATORY_CARE_PROVIDER_SITE_OTHER): Payer: Medicare Other | Admitting: Family Medicine

## 2017-03-27 VITALS — BP 118/80 | HR 82 | Temp 97.9°F | Wt 226.2 lb

## 2017-03-27 DIAGNOSIS — I872 Venous insufficiency (chronic) (peripheral): Secondary | ICD-10-CM | POA: Diagnosis not present

## 2017-03-27 DIAGNOSIS — Z1231 Encounter for screening mammogram for malignant neoplasm of breast: Secondary | ICD-10-CM | POA: Diagnosis not present

## 2017-03-27 DIAGNOSIS — R7309 Other abnormal glucose: Secondary | ICD-10-CM

## 2017-03-27 DIAGNOSIS — M1712 Unilateral primary osteoarthritis, left knee: Secondary | ICD-10-CM

## 2017-03-27 DIAGNOSIS — Z6837 Body mass index (BMI) 37.0-37.9, adult: Secondary | ICD-10-CM

## 2017-03-27 DIAGNOSIS — Z1239 Encounter for other screening for malignant neoplasm of breast: Secondary | ICD-10-CM

## 2017-03-27 DIAGNOSIS — I10 Essential (primary) hypertension: Secondary | ICD-10-CM

## 2017-03-27 DIAGNOSIS — E669 Obesity, unspecified: Secondary | ICD-10-CM | POA: Insufficient documentation

## 2017-03-27 LAB — COMPREHENSIVE METABOLIC PANEL
ALBUMIN: 4.1 g/dL (ref 3.5–5.2)
ALT: 20 U/L (ref 0–35)
AST: 16 U/L (ref 0–37)
Alkaline Phosphatase: 52 U/L (ref 39–117)
BUN: 22 mg/dL (ref 6–23)
CALCIUM: 9.6 mg/dL (ref 8.4–10.5)
CHLORIDE: 103 meq/L (ref 96–112)
CO2: 29 meq/L (ref 19–32)
Creatinine, Ser: 0.77 mg/dL (ref 0.40–1.20)
GFR: 77.77 mL/min (ref >60.00)
Glucose, Bld: 116 mg/dL — ABNORMAL HIGH (ref 70–99)
POTASSIUM: 4.9 meq/L (ref 3.5–5.1)
SODIUM: 138 meq/L (ref 135–145)
Total Bilirubin: 0.4 mg/dL (ref 0.2–1.2)
Total Protein: 7.3 g/dL (ref 6.0–8.3)

## 2017-03-27 LAB — LIPID PANEL
CHOL/HDL RATIO: 3
Cholesterol: 233 mg/dL — ABNORMAL HIGH (ref 0–200)
HDL: 77.5 mg/dL (ref >39.00)
LDL Cholesterol: 129 mg/dL — ABNORMAL HIGH (ref 0–99)
NonHDL: 155.33
TRIGLYCERIDES: 132 mg/dL (ref 0.0–149.0)
VLDL: 26.4 mg/dL (ref 0.0–40.0)

## 2017-03-27 LAB — HEMOGLOBIN A1C: Hgb A1c MFr Bld: 5.5 % (ref 4.6–6.5)

## 2017-03-27 NOTE — Progress Notes (Signed)
  Tommi Rumps, MD Phone: [phone]  Ann Spencer is a 76 y.o. female who presents today for follow-up.  Hypertension: Typically in the 130s over 80s. No chest pain, shortness of breath, orthopnea, or PND. She has venous insufficiency and has chronic lower extremity swelling that is unchanged. She wears compression stockings with good benefit. She does take her amlodipine.  She notes left knee osteoarthritis. Has received injections with that of been beneficial. She reports neck supple seeing a surgeon for replacement GERD has an appointment scheduled. Uses a cane to help walk.  Obesity: Tries to stay active though is somewhat limited by her arthritis. Typically eats chicken and fish. Salads as well. Does eat some yogurt. Tries to walk.  Colon cancer screening: Last colonoscopy was 12-15 years ago. She was anemic at that time and she had a colonoscopy which she reports is unremarkable. No blood in her stool. No prior abnormalities on colonoscopy. No family history of colon cancer.  PMH: Former smoker   ROS see history of present illness  Objective  Physical Exam Vitals:   03/27/17 0956  BP: 118/80  Pulse: 82  Temp: 97.9 F (36.6 C)    BP Readings from Last 3 Encounters:  03/27/17 118/80  01/10/17 118/84  12/27/16 126/76   Wt Readings from Last 3 Encounters:  03/27/17 226 lb 3.2 oz (102.6 kg)  12/27/16 223 lb (101.2 kg)  12/14/16 222 lb (100.7 kg)    Physical Exam  Constitutional: No distress.  Cardiovascular: Normal rate, regular rhythm and normal heart sounds.   Pulmonary/Chest: Effort normal and breath sounds normal.  Musculoskeletal: She exhibits no edema.  Right knee with minimal effusion, no tenderness, no warmth, no erythema, no ligament laxity, does have discomfort on McMurray's though no abnormalities palpated, left knee with no effusion, tenderness, warmth, or erythema, no ligamentous laxity, negative McMurray's  Neurological: She is alert.  Skin: Skin  is warm and dry. She is not diaphoretic.     Assessment/Plan: Please see individual problem list.  Essential hypertension At goal. Continue current medication.  Venous insufficiency Continue compression stockings. Monitor.  Osteoarthritis of knee (Left) She'll keep her appointment with orthopedic surgery. Continue to monitor.  Obesity Encouraged dietary changes. Encouraged to stay active as best as she is able to.   Orders Placed This Encounter  Procedures  . MM Digital Screening    Standing Status:   Future    Standing Expiration Date:   05/27/2018    Order Specific Question:   Reason for Exam (SYMPTOM  OR DIAGNOSIS REQUIRED)    Answer:   breast cancer screening    Order Specific Question:   Preferred imaging location?    Answer:   [facility]  . Comp Met (CMET)  . Lipid panel  . HgB A1c    No orders of the defined types were placed in this encounter.   # Healthcare maintenance: cologuard ordered. Advised that she needs to check with her insurance company prior to providing a sample to make sure it is covered.   Tommi Rumps, MD [place]

## 2017-03-27 NOTE — Assessment & Plan Note (Signed)
At goal. Continue current medication. 

## 2017-03-27 NOTE — Progress Notes (Signed)
Pre visit review using our clinic review tool, if applicable. No additional management support is needed unless otherwise documented below in the visit note. 

## 2017-03-27 NOTE — Assessment & Plan Note (Signed)
Encouraged dietary changes. Encouraged to stay active as best as she is able to.

## 2017-03-27 NOTE — Patient Instructions (Signed)
Nice to see you. We'll get some lab work today. We will contact you with the results. We'll get you set up for mammogram. Please check with your insurance company regarding the colon cancer screening test. If they do not cover cologuard please do not provide a sample.

## 2017-03-27 NOTE — Assessment & Plan Note (Signed)
She'll keep her appointment with orthopedic surgery. Continue to monitor.

## 2017-03-27 NOTE — Assessment & Plan Note (Signed)
Continue compression stockings. Monitor.

## 2017-03-31 ENCOUNTER — Other Ambulatory Visit: Payer: Self-pay | Admitting: Family Medicine

## 2017-03-31 DIAGNOSIS — E785 Hyperlipidemia, unspecified: Secondary | ICD-10-CM

## 2017-03-31 MED ORDER — ROSUVASTATIN CALCIUM 20 MG PO TABS: 20.0000 mg | ORAL_TABLET | Freq: Every day | ORAL | 3 refills | Status: DC

## 2017-04-04 NOTE — Telephone Encounter (Signed)
Scheduled 04/11/17

## 2017-04-11 ENCOUNTER — Ambulatory Visit: Payer: Medicare Other

## 2017-04-18 ENCOUNTER — Encounter: Payer: Self-pay | Admitting: General Surgery

## 2017-04-18 ENCOUNTER — Ambulatory Visit (INDEPENDENT_AMBULATORY_CARE_PROVIDER_SITE_OTHER): Payer: Medicare Other | Admitting: General Surgery

## 2017-04-18 VITALS — BP 142/78 | HR 76 | Resp 12 | Ht 65.0 in | Wt 225.0 lb

## 2017-04-18 DIAGNOSIS — I878 Other specified disorders of veins: Secondary | ICD-10-CM | POA: Diagnosis not present

## 2017-04-18 NOTE — Progress Notes (Signed)
Patient ID: Ann Spencer, female   DOB: Dec 13, 1940, 76 y.o.   MRN: [id number]  Chief Complaint  Patient presents with  . Other    HPI Ann Spencer is a 76 y.o. female here today for evaluation of redness in both legs. Patient states she noticed this about 10 days ago. It has improved since. She use of compression hose daily. HPI  Past Medical History:  Diagnosis Date  . Allergy   . Hypertension   . Phlebitis 2014  . Rotator cuff injury 12/2013    Past Surgical History:  Procedure Laterality Date  . ABDOMINAL HYSTERECTOMY  1981   Full  . SHOULDER SURGERY Left 03/03/15  . vein closure Bilateral Feb 2008    Family History  Problem Relation Age of Onset  . Varicose Veins Brother     Social History Social History  Substance Use Topics  . Smoking status: Former Smoker    Years: 30.00    Quit date: 12/12/1993  . Smokeless tobacco: Never Used  . Alcohol use 4.2 oz/week    7 Standard drinks or equivalent per week     Comment: Wine     Allergies  Allergen Reactions  . Penicillins     Current Outpatient Prescriptions  Medication Sig Dispense Refill  . amLODipine (NORVASC) 5 MG tablet Take 1 tablet (5 mg total) by mouth daily. 90 tablet 3  . aspirin 81 MG tablet Take 81 mg by mouth daily.    . Calcium Carbonate (CALTRATE 600 PO) Take 1 tablet by mouth daily.    . Cholecalciferol (D3 ADULT PO) Take 1 tablet by mouth daily.    . ferrous sulfate 325 (65 FE) MG EC tablet Take 325 mg by mouth daily with breakfast.    . Multiple Vitamin (MULTIVITAMIN) capsule Take 1 capsule by mouth daily.    . rosuvastatin (CRESTOR) 20 MG tablet Take 1 tablet (20 mg total) by mouth daily. 90 tablet 3   Current Facility-Administered Medications  Medication Dose Route Frequency Provider Last Rate Last Dose  . lidocaine (PF) (XYLOCAINE) 1 % injection 10 mL  10 mL Other Once Milinda Pointer, MD        Review of Systems Review of Systems  Constitutional: Negative.   Respiratory:  Negative.   Cardiovascular: Negative.     Blood pressure (!) 142/78, pulse 76, resp. rate 12, height 5\' 5"  (1.651 m), weight 225 lb (102.1 kg).  Physical Exam Physical Exam  Constitutional: She is oriented to person, place, and time. She appears well-developed and well-nourished.  Cardiovascular:  Pulses:      Dorsalis pedis pulses are 2+ on the right side, and 2+ on the left side.       Posterior tibial pulses are 2+ on the right side, and 2+ on the left side.  Bilateral edema, mild, lower legs  Neurological: She is alert and oriented to person, place, and time.  Skin: Skin is warm and dry.     Psychiatric: Her behavior is normal.     Data Reviewed Prior notes reviewed.   Assessment    Venous insufficiency with stasis changes in both legs. Swelling and discoloration much improved from last year, and distal pulses are strong.      Plan       Continue wearing compression socks and walking daily. Patient to return as needed.  HPI, Physical Exam, Assessment and Plan have been scribed under the direction and in the presence of Mckinley Jewel, MD  Gaspar Cola,  CMA  I have completed the exam and reviewed the above documentation for accuracy and completeness.  I agree with the above.  Haematologist has been used and any errors in dictation or transcription are unintentional.  Ann Spencer, M.D., F.A.C.S.  Junie Panning G 04/19/2017, 10:12 AM

## 2017-04-18 NOTE — Patient Instructions (Signed)
Patient to return as needed. 

## 2017-04-25 ENCOUNTER — Encounter: Payer: Self-pay | Admitting: Family Medicine

## 2017-04-27 ENCOUNTER — Telehealth: Payer: Self-pay | Admitting: Family Medicine

## 2017-04-27 NOTE — Telephone Encounter (Signed)
Spoke to house hold member. Pt not home right now. Called to get AWV scheduled. She will have pt call me when she gets home.

## 2017-05-02 ENCOUNTER — Other Ambulatory Visit: Payer: Medicare Other

## 2017-05-10 ENCOUNTER — Ambulatory Visit
Admission: RE | Admit: 2017-05-10 | Discharge: 2017-05-10 | Disposition: A | Discharge status: Home / Self Care | Payer: Medicare Other | Source: Ambulatory Visit | Attending: Family Medicine | Admitting: Family Medicine

## 2017-05-10 DIAGNOSIS — Z1231 Encounter for screening mammogram for malignant neoplasm of breast: Secondary | ICD-10-CM | POA: Diagnosis not present

## 2017-05-10 DIAGNOSIS — Z1239 Encounter for other screening for malignant neoplasm of breast: Secondary | ICD-10-CM

## 2017-06-05 NOTE — Telephone Encounter (Signed)
Left pt message asking to call Allison back directly at [phone] to schedule AWV. Thanks! °

## 2017-06-26 ENCOUNTER — Ambulatory Visit: Payer: Medicare Other | Admitting: Family Medicine

## 2017-07-17 ENCOUNTER — Encounter: Payer: Self-pay | Admitting: General Surgery

## 2017-07-17 ENCOUNTER — Ambulatory Visit (INDEPENDENT_AMBULATORY_CARE_PROVIDER_SITE_OTHER): Payer: Medicare Other | Admitting: General Surgery

## 2017-07-17 VITALS — BP 134/76 | HR 74 | Resp 14 | Ht 66.0 in | Wt 228.0 lb

## 2017-07-17 DIAGNOSIS — I878 Other specified disorders of veins: Secondary | ICD-10-CM | POA: Diagnosis not present

## 2017-07-17 NOTE — Progress Notes (Signed)
Patient ID: Ann Spencer, female   DOB: 1940/12/16, 76 y.o.   MRN: [id number]  Chief Complaint  Patient presents with  . Other    ankle pain    HPI Ann Spencer is a 76 y.o. female is here today for a follow up for redness in her left lower leg. She does have swelling ongoing for one week. It is very sore, left side > right. No weeping from the area. She has been wearing her stockings daily, but they are different from the stockings prescribed as the Rx stockings are too expensive.  HPI  Past Medical History:  Diagnosis Date  . Allergy   . Hypertension   . Phlebitis 2014  . Rotator cuff injury 12/2013    Past Surgical History:  Procedure Laterality Date  . ABDOMINAL HYSTERECTOMY  1981   Full  . SHOULDER SURGERY Left 03/03/15  . vein closure Bilateral Feb 2008    Family History  Problem Relation Age of Onset  . Varicose Veins Brother     Social History Social History  Substance Use Topics  . Smoking status: Former Smoker    Years: 30.00    Quit date: 12/12/1993  . Smokeless tobacco: Never Used  . Alcohol use 4.2 oz/week    7 Standard drinks or equivalent per week     Comment: Wine     Allergies  Allergen Reactions  . Penicillins     Current Outpatient Prescriptions  Medication Sig Dispense Refill  . amLODipine (NORVASC) 5 MG tablet Take 1 tablet (5 mg total) by mouth daily. 90 tablet 3  . aspirin 81 MG tablet Take 81 mg by mouth daily.    . Calcium Carbonate (CALTRATE 600 PO) Take 1 tablet by mouth daily.    . Cholecalciferol (D3 ADULT PO) Take 1 tablet by mouth daily.    . ferrous sulfate 325 (65 FE) MG EC tablet Take 325 mg by mouth daily with breakfast.    . Multiple Vitamin (MULTIVITAMIN) capsule Take 1 capsule by mouth daily.     Current Facility-Administered Medications  Medication Dose Route Frequency Provider Last Rate Last Dose  . lidocaine (PF) (XYLOCAINE) 1 % injection 10 mL  10 mL Other Once Milinda Pointer, MD        Review of  Systems Review of Systems  Constitutional: Negative.   Respiratory: Negative.   Cardiovascular: Positive for leg swelling (left leg).    Blood pressure 134/76, pulse 74, resp. rate 14, height 5\' 6"  (1.676 m), weight 228 lb (103.4 kg).  Physical Exam Physical Exam  Constitutional: She is oriented to person, place, and time. She appears well-developed and well-nourished.  Cardiovascular: Intact distal pulses.   Pulses:      Dorsalis pedis pulses are 2+ on the right side, and 2+ on the left side.       Posterior tibial pulses are 2+ on the right side, and 2+ on the left side.  Brawny induration consistent with chronic venous insufficiency, more pronounced in left extremity. varicose veins bilaterally, 2+ pitting edema bilaterally.    Musculoskeletal: She exhibits edema.  Neurological: She is alert and oriented to person, place, and time.  Skin: Skin is warm and dry.  Psychiatric: She has a normal mood and affect.    Data Reviewed Prior note   Assessment    Venous stasis- currently in flare-up, dermatis of left leg and pain bilaterally.     Plan    UNA boot applied to left extremity in office,  leave in place for 1 week.  Follow-up with nurse in 1 week.      HPI, Physical Exam, Assessment and Plan have been scribed under the direction and in the presence of Mckinley Jewel, MD.  Verlene Mayer, CMA  I have completed the exam and reviewed the above documentation for accuracy and completeness.  I agree with the above.  Haematologist has been used and any errors in dictation or transcription are unintentional.  Jemarcus Dougal G. Jamal Collin, M.D., F.A.C.S.   Junie Panning G 07/17/2017, 3:39 PM

## 2017-07-17 NOTE — Patient Instructions (Signed)
UNA boot applied to left extremity in office, leave in place for 1 week.  Follow-up with nurse in 1 week.

## 2017-07-19 DIAGNOSIS — Z1212 Encounter for screening for malignant neoplasm of rectum: Secondary | ICD-10-CM | POA: Diagnosis not present

## 2017-07-19 DIAGNOSIS — Z1211 Encounter for screening for malignant neoplasm of colon: Secondary | ICD-10-CM | POA: Diagnosis not present

## 2017-07-25 ENCOUNTER — Ambulatory Visit (INDEPENDENT_AMBULATORY_CARE_PROVIDER_SITE_OTHER): Payer: Medicare Other | Admitting: General Surgery

## 2017-07-25 VITALS — BP 140/72 | HR 76 | Resp 12 | Ht 66.0 in | Wt 228.0 lb

## 2017-07-25 DIAGNOSIS — I878 Other specified disorders of veins: Secondary | ICD-10-CM

## 2017-07-25 NOTE — Patient Instructions (Addendum)
Continue to wear Unna boot, return in 1 week

## 2017-07-25 NOTE — Progress Notes (Signed)
Patient ID: Ann Spencer, female   DOB: [birth year], 76 y.o.   MRN: [id number]  Chief Complaint  Patient presents with  . Follow-up    HPI [name] is a 76 y.o. female here today for her follow-up for venous insufficiency and stasis dermatitis. She reports some improvement in her leg pain, having some pain in her foot and ankle. Has been wearing Una boot since last visit on 07/17/17. No new problems.  HPI  Past Medical History:  Diagnosis Date  . Allergy   . Hypertension   . Phlebitis 2014  . Rotator cuff injury 12/2013    Past Surgical History:  Procedure Laterality Date  . ABDOMINAL HYSTERECTOMY  1981   Full  . SHOULDER SURGERY Left 03/03/15  . vein closure Bilateral Feb 2008    Family History  Problem Relation Age of Onset  . Varicose Veins Brother     Social History Social History  Substance Use Topics  . Smoking status: Former Smoker    Years: 30.00    Quit date: 12/12/1993  . Smokeless tobacco: Never Used  . Alcohol use 4.2 oz/week    7 Standard drinks or equivalent per week     Comment: Wine     Allergies  Allergen Reactions  . Penicillins     Current Outpatient Prescriptions  Medication Sig Dispense Refill  . amLODipine (NORVASC) 5 MG tablet Take 1 tablet (5 mg total) by mouth daily. 90 tablet 3  . aspirin 81 MG tablet Take 81 mg by mouth daily.    . Calcium Carbonate (CALTRATE 600 PO) Take 1 tablet by mouth daily.    . Cholecalciferol (D3 ADULT PO) Take 1 tablet by mouth daily.    . ferrous sulfate 325 (65 FE) MG EC tablet Take 325 mg by mouth daily with breakfast.    . Multiple Vitamin (MULTIVITAMIN) capsule Take 1 capsule by mouth daily.     Current Facility-Administered Medications  Medication Dose Route Frequency Provider Last Rate Last Dose  . lidocaine (PF) (XYLOCAINE) 1 % injection 10 mL  10 mL Other Once Milinda Pointer, MD        Review of Systems Review of Systems  Blood pressure 140/72, pulse 76, resp. rate 12, height 5\' 6"   (1.676 m), weight 228 lb (103.4 kg).  Physical Exam Physical Exam  Constitutional: She is oriented to person, place, and time. She appears well-developed and well-nourished.  Cardiovascular: Intact distal pulses.   1+ pitting edema of LLE following removal of Unna boot, 1+ pitting edema of RLE. Stasis dermatitis is improved from last week's visit.   Neurological: She is alert and oriented to person, place, and time.  Skin: Skin is warm and dry.    Data Reviewed Prior note  Assessment    Venous stasis dermatitis - some improvement in pain and swelling after one week of wearing Unna boot. Will apply Unna boot for one more week, changed today in office. Discussed with pt, she is agreeable.     Plan    Continue to wear Unna boot, return in 1 week for reevaluation.      HPI, Physical Exam, Assessment and Plan have been scribed under the direction and in the presence of Mckinley Jewel, MD  Gaspar Cola, CMA  I have completed the exam and reviewed the above documentation for accuracy and completeness.  I agree with the above.  Haematologist has been used and any errors in dictation or transcription are unintentional.  Seeplaputhur G.  Jamal Collin, M.D., F.A.C.S.  Junie Panning G 07/25/2017, 4:39 PM

## 2017-07-26 LAB — COLOGUARD: Cologuard: NEGATIVE

## 2017-08-02 ENCOUNTER — Encounter: Payer: Self-pay | Admitting: General Surgery

## 2017-08-02 ENCOUNTER — Ambulatory Visit (INDEPENDENT_AMBULATORY_CARE_PROVIDER_SITE_OTHER): Payer: Medicare Other | Admitting: General Surgery

## 2017-08-02 VITALS — BP 134/84 | HR 72 | Resp 12 | Ht 66.0 in | Wt 228.0 lb

## 2017-08-02 DIAGNOSIS — I878 Other specified disorders of veins: Secondary | ICD-10-CM

## 2017-08-02 NOTE — Patient Instructions (Addendum)
The patient is aware to call back for any questions or concerns.   Continue to wear compression hose. Follow up as needed.

## 2017-08-02 NOTE — Progress Notes (Signed)
Patient ID: Ann Spencer, female   DOB: [birth year], 76 y.o.   MRN: [id number]  Chief Complaint  Patient presents with  . Follow-up    HPI Ann Spencer is a 76 y.o. female here today for her follow up unna boot.  HPI  Past Medical History:  Diagnosis Date  . Allergy   . Hypertension   . Phlebitis 2014  . Rotator cuff injury 12/2013    Past Surgical History:  Procedure Laterality Date  . ABDOMINAL HYSTERECTOMY  1981   Full  . SHOULDER SURGERY Left 03/03/15  . vein closure Bilateral Feb 2008    Family History  Problem Relation Age of Onset  . Varicose Veins Brother     Social History Social History  Substance Use Topics  . Smoking status: Former Smoker    Years: 30.00    Quit date: 12/12/1993  . Smokeless tobacco: Never Used  . Alcohol use 4.2 oz/week    7 Standard drinks or equivalent per week     Comment: Wine     Allergies  Allergen Reactions  . Penicillins     Current Outpatient Prescriptions  Medication Sig Dispense Refill  . amLODipine (NORVASC) 5 MG tablet Take 1 tablet (5 mg total) by mouth daily. 90 tablet 3  . aspirin 81 MG tablet Take 81 mg by mouth daily.    . Calcium Carbonate (CALTRATE 600 PO) Take 1 tablet by mouth daily.    . Cholecalciferol (D3 ADULT PO) Take 1 tablet by mouth daily.    . ferrous sulfate 325 (65 FE) MG EC tablet Take 325 mg by mouth daily with breakfast.    . Multiple Vitamin (MULTIVITAMIN) capsule Take 1 capsule by mouth daily.     Current Facility-Administered Medications  Medication Dose Route Frequency Provider Last Rate Last Dose  . lidocaine (PF) (XYLOCAINE) 1 % injection 10 mL  10 mL Other Once Milinda Pointer, MD        Review of Systems Review of Systems  Constitutional: Negative.   Respiratory: Negative.   Cardiovascular: Negative.     Blood pressure 134/84, pulse 72, resp. rate 12, height 5\' 6"  (1.676 m), weight 228 lb (103.4 kg).  Physical Exam Physical Exam  Constitutional: She is oriented to  person, place, and time. She appears well-developed and well-nourished.  Neurological: She is alert and oriented to person, place, and time.  Skin: Skin is warm and dry.  Psychiatric: Her behavior is normal.  Left leg shows significant improvement with the reduction of the induration and tenderness and the skin and less discoloration in the inner aspect of the lower third of the leg.  Data Reviewed Progress notes  Assessment    Stasis dermatitis left leg much improved. Chronic venous insufficiency in both legs with recurring stasis dermatitis    Plan       Continue to wear compression hose. Follow up as needed.  HPI, Physical Exam, Assessment and Plan have been scribed under the direction and in the presence of Mckinley Jewel, MD  Gaspar Cola, CMA I have completed the exam and reviewed the above documentation for accuracy and completeness.  I agree with the above.  Haematologist has been used and any errors in dictation or transcription are unintentional.  Keiera Strathman G. Jamal Collin, M.D., F.A.C.S.   Junie Panning G 08/04/2017, 8:49 AM

## 2017-09-04 ENCOUNTER — Encounter: Payer: Self-pay | Admitting: Family Medicine

## 2017-09-04 ENCOUNTER — Ambulatory Visit (INDEPENDENT_AMBULATORY_CARE_PROVIDER_SITE_OTHER): Payer: Medicare Other

## 2017-09-04 ENCOUNTER — Ambulatory Visit (INDEPENDENT_AMBULATORY_CARE_PROVIDER_SITE_OTHER): Payer: Medicare Other | Admitting: Family Medicine

## 2017-09-04 VITALS — BP 150/84 | HR 86 | Temp 97.7°F | Wt 235.6 lb

## 2017-09-04 DIAGNOSIS — Z23 Encounter for immunization: Secondary | ICD-10-CM

## 2017-09-04 DIAGNOSIS — M542 Cervicalgia: Secondary | ICD-10-CM | POA: Diagnosis not present

## 2017-09-04 DIAGNOSIS — G8929 Other chronic pain: Secondary | ICD-10-CM

## 2017-09-04 DIAGNOSIS — M5031 Other cervical disc degeneration,  high cervical region: Secondary | ICD-10-CM | POA: Diagnosis not present

## 2017-09-04 NOTE — Progress Notes (Signed)
  Tommi Rumps, MD Phone: [phone]  Ann Spencer is a 76 y.o. female who presents today for same-day visit.  Patient notes chronic history of neck pain and intermittent tingling and numbness in her arms. Has worsened somewhat over a number of weeks. Notes left trapezius area neck discomfort. Notes intermittent numbness and tingling in the ulnar aspect of her left hand during the day. Also notes that her whole left arm and her right arm can go numb at times when she is sleeping. Resolves with change in position. Does endorse some weakness in both of her hands left slightly greater than right and feeling as though she can't open a pickle jar at times. No radiation of pain down her arms. She had an MRI previously that revealed severe degenerative disc disease. She is currently taking ibuprofen for this.   ROS see history of present illness  Objective  Physical Exam Vitals:   09/04/17 1310  BP: (!) 150/84  Pulse: 86  Temp: 97.7 F (36.5 C)  SpO2: 96%    BP Readings from Last 3 Encounters:  09/04/17 (!) 150/84  08/02/17 134/84  07/25/17 140/72   Wt Readings from Last 3 Encounters:  09/04/17 235 lb 9.6 oz (106.9 kg)  08/02/17 228 lb (103.4 kg)  07/25/17 228 lb (103.4 kg)    Physical Exam  Constitutional: No distress.  Cardiovascular: Normal rate, regular rhythm and normal heart sounds.   Pulmonary/Chest: Effort normal and breath sounds normal.  Musculoskeletal:  No midline neck tenderness, no midline neck step-off, there is left trapezius muscular tenderness in the midportion of the trapezius  Neurological: She is alert.  CN 2-12 intact, 5/5 strength in bilateral biceps, triceps, grip, quads, hamstrings, plantar and dorsiflexion, slight decreased light touch sensation in ulnar aspect left hand, otherwise sensation to light touch intact in bilateral UE and LE, normal finger to nose, normal rapid alternating movements  Skin: Skin is warm and dry. She is not diaphoretic.      Assessment/Plan: Please see individual problem list.  Chronic neck pain Patient with chronic neck pain with worsening recently and now with intermittent numbness and tingling in the ulnar aspect of her left hand. Also with intermittent nighttime numbness in her left and right arms that resolves with change in position. Suspect nerve impingement given her history and history of severe degenerative disc disease in her neck. Does have a slight decrease in sensation in the ulnar aspect left hand though otherwise neurologically intact. Obtain an x-ray of her cervical spine today. Advised that she will likely need an MRI and likely need to see a surgeon to consider injections versus surgery. Offered prednisone given likely nerve impingement though she deferred at this time. She is given return precautions.   Orders Placed This Encounter  Procedures  . DG Cervical Spine Complete    Standing Status:   Future    Standing Expiration Date:   11/04/2018    Order Specific Question:   Reason for Exam (SYMPTOM  OR DIAGNOSIS REQUIRED)    Answer:   chronic left neck pain with numbness and tingling radial aspect left hand, history severe DDD cervical spine    Order Specific Question:   Preferred imaging location?    Answer:   [facility] Specific Question:   Radiology Contrast Protocol - do NOT remove file path    Answer:   [email or web address]   Tommi Rumps, MD [name]

## 2017-09-04 NOTE — Assessment & Plan Note (Signed)
Patient with chronic neck pain with worsening recently and now with intermittent numbness and tingling in the ulnar aspect of her left hand. Also with intermittent nighttime numbness in her left and right arms that resolves with change in position. Suspect nerve impingement given her history and history of severe degenerative disc disease in her neck. Does have a slight decrease in sensation in the ulnar aspect left hand though otherwise neurologically intact. Obtain an x-ray of her cervical spine today. Advised that she will likely need an MRI and likely need to see a surgeon to consider injections versus surgery. Offered prednisone given likely nerve impingement though she deferred at this time. She is given return precautions.

## 2017-09-04 NOTE — Patient Instructions (Signed)
Nice to see you. We'll get a neck x-ray and determine the next step in management following that. If you develop persistent numbness, weakness, or any new or change in symptoms please be evaluated.

## 2017-09-05 ENCOUNTER — Ambulatory Visit: Payer: Medicare Other

## 2017-09-05 ENCOUNTER — Other Ambulatory Visit: Payer: Self-pay | Admitting: Family Medicine

## 2017-09-05 DIAGNOSIS — R202 Paresthesia of skin: Secondary | ICD-10-CM

## 2017-09-19 ENCOUNTER — Ambulatory Visit
Admission: RE | Admit: 2017-09-19 | Discharge: 2017-09-19 | Disposition: A | Discharge status: Home / Self Care | Payer: Medicare Other | Source: Ambulatory Visit | Attending: Family Medicine | Admitting: Family Medicine

## 2017-09-19 DIAGNOSIS — M47812 Spondylosis without myelopathy or radiculopathy, cervical region: Secondary | ICD-10-CM | POA: Insufficient documentation

## 2017-09-19 DIAGNOSIS — M4802 Spinal stenosis, cervical region: Secondary | ICD-10-CM | POA: Diagnosis not present

## 2017-09-19 DIAGNOSIS — R202 Paresthesia of skin: Secondary | ICD-10-CM | POA: Diagnosis not present

## 2017-09-19 DIAGNOSIS — M1288 Other specific arthropathies, not elsewhere classified, other specified site: Secondary | ICD-10-CM | POA: Diagnosis not present

## 2017-09-19 DIAGNOSIS — M542 Cervicalgia: Secondary | ICD-10-CM | POA: Diagnosis not present

## 2017-09-21 ENCOUNTER — Other Ambulatory Visit: Payer: Self-pay | Admitting: Family Medicine

## 2017-09-21 DIAGNOSIS — M5030 Other cervical disc degeneration, unspecified cervical region: Secondary | ICD-10-CM

## 2017-09-24 ENCOUNTER — Other Ambulatory Visit: Payer: Self-pay | Admitting: Family Medicine

## 2017-10-03 DIAGNOSIS — R200 Anesthesia of skin: Secondary | ICD-10-CM | POA: Diagnosis not present

## 2017-10-25 DIAGNOSIS — G629 Polyneuropathy, unspecified: Secondary | ICD-10-CM | POA: Insufficient documentation

## 2017-10-25 DIAGNOSIS — G5602 Carpal tunnel syndrome, left upper limb: Secondary | ICD-10-CM | POA: Insufficient documentation

## 2017-10-25 DIAGNOSIS — R200 Anesthesia of skin: Secondary | ICD-10-CM | POA: Insufficient documentation

## 2018-01-13 DIAGNOSIS — H1089 Other conjunctivitis: Secondary | ICD-10-CM | POA: Diagnosis not present

## 2018-01-15 ENCOUNTER — Ambulatory Visit: Payer: Self-pay

## 2018-01-15 NOTE — Telephone Encounter (Signed)
Daughter calling on behalf of her mother. Pt is c/o moderate throat pain and hoarseness. Pt has had exposure to strep throat and the flu. Daughter states that you can tell her glands are swollen due to the way her mother is talking. She is able to drink and eat soft foods. Care advice given. Appt made with PCP tomorrow 0930.  Reason for Disposition . [1] Exposure to family member (or spouse or boyfriend/girlfriend) with test-proven strep AND [2] within last 10 days . [1] Sore throat is the only symptom AND [2] present > 48 hours  Answer Assessment - Initial Assessment Questions 1. ONSET: "When did the throat start hurting?" (Hours or days ago)      yesterday 2. SEVERITY: "How bad is the sore throat?" (Scale 1-10; mild, moderate or severe)   - MILD (1-3):  doesn't interfere with eating or normal activities   - MODERATE (4-7): interferes with eating some solids and normal activities   - SEVERE (8-10):  excruciating pain, interferes with most normal activities   - SEVERE DYSPHAGIA: can't swallow liquids, drooling     moderate 3. STREP EXPOSURE: "Has there been any exposure to strep within the past week?" If so, ask: "What type of contact occurred?"      Had exposure within the past 2.5 weeks-lives in same household 4.  VIRAL SYMPTOMS: "Are there any symptoms of a cold, such as a runny nose, cough, hoarse voice or red eyes?"      Hoarse voice,? Post nasal drainage per daughter, red eyes + 5. FEVER: "Do you have a fever?" If so, ask: "What is your temperature, how was it measured, and when did it start?"     Denies fever 6. PUS ON THE TONSILS: "Is there pus on the tonsils in the back of your throat?"     Does not know 7. OTHER SYMPTOMS: "Do you have any other symptoms?" (e.g., difficulty breathing, headache, rash)     No rash hard to swallow, swollen glands, no difficulty breathing 8. PREGNANCY: "Is there any chance you are pregnant?" "When was your last menstrual period?"     n/a  Protocols  used: SORE THROAT-A-AH

## 2018-01-16 ENCOUNTER — Ambulatory Visit (INDEPENDENT_AMBULATORY_CARE_PROVIDER_SITE_OTHER): Payer: Medicare Other | Admitting: Family Medicine

## 2018-01-16 ENCOUNTER — Other Ambulatory Visit: Payer: Self-pay

## 2018-01-16 ENCOUNTER — Encounter: Payer: Self-pay | Admitting: Family Medicine

## 2018-01-16 VITALS — BP 138/80 | HR 83 | Temp 97.5°F | Wt 236.6 lb

## 2018-01-16 DIAGNOSIS — Z780 Asymptomatic menopausal state: Secondary | ICD-10-CM | POA: Diagnosis not present

## 2018-01-16 DIAGNOSIS — J029 Acute pharyngitis, unspecified: Secondary | ICD-10-CM | POA: Diagnosis not present

## 2018-01-16 DIAGNOSIS — J069 Acute upper respiratory infection, unspecified: Secondary | ICD-10-CM | POA: Diagnosis not present

## 2018-01-16 DIAGNOSIS — Z1211 Encounter for screening for malignant neoplasm of colon: Secondary | ICD-10-CM

## 2018-01-16 LAB — POCT RAPID STREP A (OFFICE): RAPID STREP A SCREEN: NEGATIVE

## 2018-01-16 NOTE — Patient Instructions (Signed)
Nice to see you. You likely have a viral illness.  You can use Flonase and Claritin to help with your symptoms.  You can continue the eyedrops as previously prescribed from the walk-in clinic. If you develop worsening symptoms, cough productive of blood, trouble breathing, fevers, or any new or changing symptoms please be reevaluated.

## 2018-01-16 NOTE — Assessment & Plan Note (Signed)
Symptoms most consistent with viral upper respiratory infection.  Rapid strep test was negative.  Symptoms do not fit with strep throat.  Discussed supportive care with Claritin and Flonase.  She will continue her eyedrops.  Given return precautions.

## 2018-01-16 NOTE — Progress Notes (Signed)
  Tommi Rumps, MD Phone: [phone]  Ann Spencer is a 77 y.o. female who presents today for same-day visit.  Patient seen for conjunctivitis over the weekend at the walk-in clinic and started on ofloxacin drops.  She notes her eyes were bloodshot and watering with matting in the morning.  She had no foreign body sensation.  No photophobia.  No vision changes.  She notes those symptoms have improved with the ofloxacin though she still notes slight watering of her eyes.  She has also since developed postnasal drip, cough that is nonproductive, and facial congestion.  No fevers.  No chest congestion.  She notes strep throat exposure as well as flu exposure several weeks ago.  She has had no body aches.  Her symptoms have been gradual in onset.  Social History   Tobacco Use  Smoking Status Former Smoker  . Years: 30.00  . Last attempt to quit: 12/12/1993  . Years since quitting: 24.1  Smokeless Tobacco Never Used     ROS see history of present illness  Objective  Physical Exam Vitals:   01/16/18 0934  BP: 138/80  Pulse: 83  Temp: (!) 97.5 F (36.4 C)  SpO2: 96%    BP Readings from Last 3 Encounters:  01/16/18 138/80  09/04/17 (!) 150/84  08/02/17 134/84   Wt Readings from Last 3 Encounters:  01/16/18 236 lb 9.6 oz (107.3 kg)  09/04/17 235 lb 9.6 oz (106.9 kg)  08/02/17 228 lb (103.4 kg)    Physical Exam  Constitutional: No distress.  HENT:  Head: Normocephalic and atraumatic.  Mouth/Throat: No oropharyngeal exudate.  Mild posterior oropharyngeal erythema, normal TMs bilaterally  Eyes: Conjunctivae are normal. Pupils are equal, round, and reactive to light.  Eyes do appear watery  Neck: Neck supple.  Cardiovascular: Normal rate, regular rhythm and normal heart sounds.  Pulmonary/Chest: Effort normal and breath sounds normal.  Musculoskeletal: She exhibits no edema.  Lymphadenopathy:    She has no cervical adenopathy.  Neurological: She is alert. Gait  normal.  Skin: Skin is warm and dry. She is not diaphoretic.     Assessment/Plan: Please see individual problem list.  URI (upper respiratory infection) Symptoms most consistent with viral upper respiratory infection.  Rapid strep test was negative.  Symptoms do not fit with strep throat.  Discussed supportive care with Claritin and Flonase.  She will continue her eyedrops.  Given return precautions.  She reports it has been sometime since a bone density test or colonoscopy.  Orders were placed for referral to GI and bone density testing.  Orders Placed This Encounter  Procedures  . DG Bone Density    Standing Status:   Future    Standing Expiration Date:   03/17/2019    Order Specific Question:   Reason for Exam (SYMPTOM  OR DIAGNOSIS REQUIRED)    Answer:   postmenopausal estrogen deficiency    Order Specific Question:   Preferred imaging location?    Answer:   [facility]  . Ambulatory referral to Gastroenterology    Referral Priority:   Routine    Referral Type:   Consultation    Referral Reason:   Specialty Services Required    Number of Visits Requested:   1  . POCT rapid strep A    No orders of the defined types were placed in this encounter.    Tommi Rumps, MD [place]

## 2018-01-23 ENCOUNTER — Other Ambulatory Visit: Payer: Self-pay

## 2018-01-23 DIAGNOSIS — Z1211 Encounter for screening for malignant neoplasm of colon: Secondary | ICD-10-CM

## 2018-02-05 ENCOUNTER — Ambulatory Visit (INDEPENDENT_AMBULATORY_CARE_PROVIDER_SITE_OTHER)
Admission: RE | Admit: 2018-02-05 | Discharge: 2018-02-05 | Disposition: A | Discharge status: Home / Self Care | Payer: Medicare Other | Source: Ambulatory Visit | Attending: Family Medicine | Admitting: Family Medicine

## 2018-02-05 ENCOUNTER — Ambulatory Visit (INDEPENDENT_AMBULATORY_CARE_PROVIDER_SITE_OTHER): Payer: Medicare Other | Admitting: Family Medicine

## 2018-02-05 ENCOUNTER — Encounter: Payer: Self-pay | Admitting: Family Medicine

## 2018-02-05 ENCOUNTER — Other Ambulatory Visit: Payer: Self-pay

## 2018-02-05 VITALS — BP 90/64 | HR 90 | Temp 98.8°F | Ht 66.0 in | Wt 232.8 lb

## 2018-02-05 DIAGNOSIS — R059 Cough, unspecified: Secondary | ICD-10-CM

## 2018-02-05 DIAGNOSIS — R05 Cough: Secondary | ICD-10-CM

## 2018-02-05 LAB — POC INFLUENZA A&B (BINAX/QUICKVUE)
INFLUENZA A, POC: NEGATIVE
Influenza B, POC: NEGATIVE

## 2018-02-05 MED ORDER — DOXYCYCLINE HYCLATE 100 MG PO TABS: 100.0000 mg | ORAL_TABLET | Freq: Two times a day (BID) | ORAL | 0 refills | Status: DC

## 2018-02-05 NOTE — Patient Instructions (Addendum)
Rest, fluids and complete course of antibiotics x 10 days.  if shortness of breath or cannot tolerate antibiotics , call. If severe shortness of breath.. Go to ER.

## 2018-02-05 NOTE — Progress Notes (Signed)
   Subjective:    Patient ID: Ann Spencer, female    DOB: May 28, 1941, 77 y.o.   MRN: [id number]  Cough  This is a new problem. The current episode started more than 1 month ago ( off and on in last month). The problem has been gradually worsening (in last 2 days). The cough is productive of sputum. Associated symptoms include chills, ear pain, headaches, myalgias, nasal congestion, postnasal drip and a sore throat. Pertinent negatives include no chest pain, fever, shortness of breath or wheezing. Associated symptoms comments:  Fatigue, chest congestion, post nasal drip  left ear pain. The symptoms are aggravated by lying down. Risk factors for lung disease include smoking/tobacco exposure ( 25 pack year history, remote). She has tried OTC cough suppressant for the symptoms. The treatment provided mild relief. Her past medical history is significant for environmental allergies. There is no history of asthma, COPD or pneumonia.  Headache   Associated symptoms include coughing, ear pain and a sore throat. Pertinent negatives include no fever.    No sick contacts noted.  Seen 2/52018: viral URI, neg strep test Blood pressure 90/64, pulse 90, temperature 98.8 F (37.1 C), temperature source Oral, height 5\' 6"  (1.676 m), weight 232 lb 12 oz (105.6 kg), SpO2 92 %.    Review of Systems  Constitutional: Positive for chills. Negative for fever.  HENT: Positive for ear pain, postnasal drip and sore throat.   Respiratory: Positive for cough. Negative for shortness of breath and wheezing.   Cardiovascular: Negative for chest pain.  Musculoskeletal: Positive for myalgias.  Allergic/Immunologic: Positive for environmental allergies.  Neurological: Positive for headaches.       Objective:   Physical Exam  Constitutional: Vital signs are normal. She appears well-developed and well-nourished. She is cooperative.  Non-toxic appearance. She does not appear ill. No distress.  HENT:  Head:  Normocephalic.  Right Ear: Hearing, tympanic membrane, external ear and ear canal normal. Tympanic membrane is not erythematous, not retracted and not bulging.  Left Ear: Hearing, tympanic membrane, external ear and ear canal normal. Tympanic membrane is not erythematous, not retracted and not bulging.  Nose: Mucosal edema and rhinorrhea present. Right sinus exhibits no maxillary sinus tenderness and no frontal sinus tenderness. Left sinus exhibits no maxillary sinus tenderness and no frontal sinus tenderness.  Mouth/Throat: Uvula is midline, oropharynx is clear and moist and mucous membranes are normal.  Eyes: Conjunctivae, EOM and lids are normal. Pupils are equal, round, and reactive to light. Lids are everted and swept, no foreign bodies found.  Neck: Trachea normal and normal range of motion. Neck supple. Carotid bruit is not present. No thyroid mass and no thyromegaly present.  Cardiovascular: Normal rate, regular rhythm, S1 normal, S2 normal, normal heart sounds, intact distal pulses and normal pulses. Exam reveals no gallop and no friction rub.  No murmur heard. Pulmonary/Chest: Effort normal. No tachypnea. No respiratory distress. She has no decreased breath sounds. She has no wheezes. She has rhonchi. She has no rales.  diffuse rhonchi in bilateral lungs at bases  Neurological: She is alert.  Skin: Skin is warm, dry and intact. No rash noted.  Psychiatric: Her speech is normal and behavior is normal. Judgment normal. Her mood appears not anxious. Cognition and memory are normal. She does not exhibit a depressed mood.          Assessment & Plan:

## 2018-02-09 ENCOUNTER — Telehealth: Payer: Self-pay | Admitting: Gastroenterology

## 2018-02-09 NOTE — Telephone Encounter (Signed)
Returned patient's call.   Rescheduled colonoscopy to 3/19 with Dr. Marius Ditch.  Contacted ENDO for the change.

## 2018-02-09 NOTE — Telephone Encounter (Signed)
Patient is sick and needs to reschedule her colonoscopy. Please call

## 2018-02-13 ENCOUNTER — Encounter: Payer: Self-pay | Admitting: Family Medicine

## 2018-02-13 ENCOUNTER — Ambulatory Visit (INDEPENDENT_AMBULATORY_CARE_PROVIDER_SITE_OTHER): Payer: Medicare Other | Admitting: Family Medicine

## 2018-02-13 VITALS — BP 134/80 | HR 75 | Temp 97.4°F | Wt 230.0 lb

## 2018-02-13 DIAGNOSIS — J189 Pneumonia, unspecified organism: Secondary | ICD-10-CM

## 2018-02-13 DIAGNOSIS — E785 Hyperlipidemia, unspecified: Secondary | ICD-10-CM | POA: Diagnosis not present

## 2018-02-13 DIAGNOSIS — I10 Essential (primary) hypertension: Secondary | ICD-10-CM

## 2018-02-13 MED ORDER — HYDROCHLOROTHIAZIDE 12.5 MG PO TABS: 12.5000 mg | ORAL_TABLET | Freq: Every day | ORAL | 3 refills | Status: DC

## 2018-02-13 NOTE — Progress Notes (Signed)
  Tommi Rumps, MD Phone: [phone]  Ann Spencer is a 77 y.o. female who presents today for follow-up.  Patient seen last week and diagnosed with pneumonia.  Treated with doxycycline.  She notes she is quite a bit better.  Cough has continued though is nonproductive now.  No significant chest congestion.  Fatigue has improved.  No chest pain or shortness of breath.  Very little postnasal drip.  No fevers.  She still has several days left of doxycycline.  She was using NyQuil for cough.  Hypertension: Has been well controlled.  Taking HCTZ.  No chest pain or shortness of breath.  She is due to have colonoscopy later this month.  Hyperlipidemia: Not on any medication.  She plans to change diet and activity and defers recheck of these things until her next visit.  Social History   Tobacco Use  Smoking Status Former Smoker  . Years: 30.00  . Last attempt to quit: 12/12/1993  . Years since quitting: 24.1  Smokeless Tobacco Never Used     ROS see history of present illness  Objective  Physical Exam Vitals:   02/13/18 1516  BP: 134/80  Pulse: 75  Temp: (!) 97.4 F (36.3 C)  SpO2: 92%    BP Readings from Last 3 Encounters:  02/13/18 134/80  02/05/18 90/64  01/16/18 138/80   Wt Readings from Last 3 Encounters:  02/13/18 230 lb (104.3 kg)  02/05/18 232 lb 12 oz (105.6 kg)  01/16/18 236 lb 9.6 oz (107.3 kg)    Physical Exam  Constitutional: No distress.  Cardiovascular: Normal rate, regular rhythm and normal heart sounds.  Pulmonary/Chest: Effort normal and breath sounds normal.  Musculoskeletal: She exhibits no edema.  Neurological: She is alert. Gait normal.  Skin: Skin is warm and dry. She is not diaphoretic.     Assessment/Plan: Please see individual problem list.  Pneumonia due to infectious organism Has improved with doxycycline.  She will finish the course and monitor.  If symptoms recur she will be reevaluated.  She will return at the end of March  for a follow-up chest x-ray to confirm resolution of pneumonia.  Essential hypertension Well-controlled.  Continue HCTZ.  Hyperlipidemia She is to work on diet and exercise and plan to recheck at her next visit.   Orders Placed This Encounter  Procedures  . DG Chest 2 View    Standing Status:   Future    Standing Expiration Date:   04/16/2019    Order Specific Question:   Reason for Exam (SYMPTOM  OR DIAGNOSIS REQUIRED)    Answer:   follow-up from pneumonia    Order Specific Question:   Preferred imaging location?    Answer:   [facility] Specific Question:   Radiology Contrast Protocol - do NOT remove file path    Answer:   [email or web address]    Meds ordered this encounter  Medications  . hydrochlorothiazide (HYDRODIURIL) 12.5 MG tablet    Sig: Take 1 tablet (12.5 mg total) by mouth daily.    Dispense:  90 tablet    Refill:  [place], MD [place]

## 2018-02-13 NOTE — Patient Instructions (Signed)
Nice to see you. Please continue to monitor your symptoms.  They should continue to improve.  Please return in 1 month for a chest x-ray. If your symptoms worsen or you develop new symptoms please let us know. If you develop chest pain, shortness of breath, or cough productive of blood please be evaluated immediately.

## 2018-02-15 DIAGNOSIS — J189 Pneumonia, unspecified organism: Secondary | ICD-10-CM

## 2018-02-15 DIAGNOSIS — E785 Hyperlipidemia, unspecified: Secondary | ICD-10-CM | POA: Insufficient documentation

## 2018-02-15 HISTORY — DX: Pneumonia, unspecified organism: J18.9

## 2018-02-15 NOTE — Assessment & Plan Note (Signed)
Well-controlled. Continue HCTZ

## 2018-02-15 NOTE — Assessment & Plan Note (Addendum)
Has improved with doxycycline.  She will finish the course and monitor.  If symptoms recur she will be reevaluated.  She will return at the end of March for a follow-up chest x-ray to confirm resolution of pneumonia.

## 2018-02-15 NOTE — Assessment & Plan Note (Signed)
She is to work on diet and exercise and plan to recheck at her next visit.

## 2018-02-26 ENCOUNTER — Telehealth: Payer: Self-pay | Admitting: Gastroenterology

## 2018-02-26 NOTE — Telephone Encounter (Signed)
Colonoscopy has been canceled due to emergency with daughter.  Trish in Endo has been notified.  Thanks [employer]

## 2018-02-26 NOTE — Telephone Encounter (Signed)
Chase called and has a family emergency with her daughter. She has to cancel her procedure tomorrow  02/27/18 with Dr. Marius Ditch. She will call back later to rescedule.

## 2018-02-27 ENCOUNTER — Ambulatory Visit: Admit: 2018-02-27 | Payer: Medicare Other | Admitting: Gastroenterology

## 2018-02-27 SURGERY — COLONOSCOPY WITH PROPOFOL
Anesthesia: General

## 2018-03-05 ENCOUNTER — Encounter: Payer: Self-pay | Admitting: Family Medicine

## 2018-03-05 ENCOUNTER — Ambulatory Visit (INDEPENDENT_AMBULATORY_CARE_PROVIDER_SITE_OTHER): Payer: Medicare Other | Admitting: Family Medicine

## 2018-03-05 VITALS — BP 112/68 | HR 99 | Temp 98.1°F | Ht 66.0 in | Wt 229.2 lb

## 2018-03-05 DIAGNOSIS — J209 Acute bronchitis, unspecified: Secondary | ICD-10-CM

## 2018-03-05 MED ORDER — AZITHROMYCIN 250 MG PO TABS: ORAL_TABLET | ORAL | 0 refills | Status: DC

## 2018-03-05 MED ORDER — PREDNISONE 10 MG PO TABS: ORAL_TABLET | ORAL | 0 refills | Status: DC

## 2018-03-05 MED ORDER — BENZONATATE 100 MG PO CAPS: 100.0000 mg | ORAL_CAPSULE | Freq: Three times a day (TID) | ORAL | 0 refills | Status: DC

## 2018-03-05 NOTE — Progress Notes (Signed)
Subjective:    Patient ID: CATLIN DORIA, female    DOB: 03/12/1941, 77 y.o.   MRN: [id number]  HPI  Ms. Luis is a 77 year old female who presents today with a cough that is productive of thick, yellow sputum, nasal congestion, rhinitis, sinus pressure/pain, and ear pressure in left ear. She denies fever, chills, sweats, N/V/D, and SOB. Prior treatment with acetaminophen, OTC cough syrup, and doxycycline for prior pneumonia diagnosis provided benefit but symptom of cough has returned. No history of asthma or COPD. Prior smoker; quit over 20 years ago. History of HTN. Influenza vaccine is UTD. Pneumococcal Conjugate 13 is UTD.  She was treated for pneumonia earlier this month with doxycycline. She reported feeling better on 02/13/18 and noted that her cough was nonproductive at that time. Chest X-ray 02/05/18 noted inferior right upper lobe pulmonary opacity of indeterminate acuity and significance but a new finding since 02/14/14. Findings noted that this could represent progressive interstitial thickening, however infection or aspiration could not be excluded. Antibiotics were suggested with follow up chest X-ray in one month.   She is due for a wellness visit and plans to schedule this soon.    Review of Systems  Constitutional: Negative for chills, fatigue and fever.  HENT: Positive for congestion, rhinorrhea, sinus pressure and sinus pain. Negative for sore throat.        Left ear pressure  Respiratory: Positive for cough and wheezing.   Gastrointestinal: Negative for abdominal pain, diarrhea, nausea and vomiting.  Musculoskeletal: Negative for myalgias.  Skin: Negative for rash.  Neurological: Negative for dizziness, seizures and numbness.   Past Medical History:  Diagnosis Date  . Allergy   . Hypertension   . Phlebitis 2014  . Rotator cuff injury 12/2013     Social History   Socioeconomic History  . Marital status: Widowed    Spouse name: Not on file  . Number of  children: Not on file  . Years of education: Not on file  . Highest education level: Not on file  Occupational History  . Not on file  Social Needs  . Financial resource strain: Not on file  . Food insecurity:    Worry: Not on file    Inability: Not on file  . Transportation needs:    Medical: Not on file    Non-medical: Not on file  Tobacco Use  . Smoking status: Former Smoker    Years: 30.00    Last attempt to quit: 12/12/1993    Years since quitting: 24.2  . Smokeless tobacco: Never Used  Substance and Sexual Activity  . Alcohol use: Yes    Alcohol/week: 4.2 oz    Types: 7 Standard drinks or equivalent per week    Comment: Wine   . Drug use: No  . Sexual activity: Never  Lifestyle  . Physical activity:    Days per week: Not on file    Minutes per session: Not on file  . Stress: Not on file  Relationships  . Social connections:    Talks on phone: Not on file    Gets together: Not on file    Attends religious service: Not on file    Active member of club or organization: Not on file    Attends meetings of clubs or organizations: Not on file    Relationship status: Not on file  . Intimate partner violence:    Fear of current or ex partner: Not on file    Emotionally abused:  Not on file    Physically abused: Not on file    Forced sexual activity: Not on file  Other Topics Concern  . Not on file  Social History Narrative   [place]   Lives with daughter and grandaughter    2 dogs lives inside   Enjoys reading    Past Surgical History:  Procedure Laterality Date  . ABDOMINAL HYSTERECTOMY  1981   Full  . SHOULDER SURGERY Left 03/03/15  . vein closure Bilateral Feb 2008    Family History  Problem Relation Age of Onset  . Varicose Veins Brother     Allergies  Allergen Reactions  . Penicillins     Current Outpatient Medications on File Prior to Visit  Medication Sig Dispense Refill  . aspirin 81 MG tablet Take 81 mg by mouth daily.    .  Calcium Carbonate (CALTRATE 600 PO) Take 1 tablet by mouth daily.    . Cholecalciferol (D3 ADULT PO) Take 1 tablet by mouth daily.    . ferrous sulfate 325 (65 FE) MG EC tablet Take 325 mg by mouth daily with breakfast.    . hydrochlorothiazide (HYDRODIURIL) 12.5 MG tablet Take 1 tablet (12.5 mg total) by mouth daily. 90 tablet 3  . Multiple Vitamin (MULTIVITAMIN) capsule Take 1 capsule by mouth daily.     Current Facility-Administered Medications on File Prior to Visit  Medication Dose Route Frequency Provider Last Rate Last Dose  . lidocaine (PF) (XYLOCAINE) 1 % injection 10 mL  10 mL Other Once Milinda Pointer, MD        BP 112/68 (BP Location: Left Arm, Patient Position: Sitting, Cuff Size: Large)   Pulse 99   Temp 98.1 F (36.7 C) (Oral)   Ht 5\' 6"  (1.676 m)   Wt 229 lb 3.2 oz (104 kg)   SpO2 97%   BMI 36.99 kg/m       Objective:   Physical Exam  Constitutional: She is oriented to person, place, and time. She appears well-developed and well-nourished.  HENT:  Right Ear: Tympanic membrane normal.  Left Ear: Tympanic membrane normal.  Nose: Rhinorrhea present. Right sinus exhibits no maxillary sinus tenderness and no frontal sinus tenderness. Left sinus exhibits no maxillary sinus tenderness and no frontal sinus tenderness.  Mouth/Throat: Oropharynx is clear and moist and mucous membranes are normal.  Eyes: Pupils are equal, round, and reactive to light. No scleral icterus.  Neck: Neck supple.  Cardiovascular: Normal rate and regular rhythm.  Pulmonary/Chest: Effort normal. She has no rales.  Scattered wheezing present  Abdominal: Soft. Bowel sounds are normal.  Musculoskeletal: She exhibits no edema.  Lymphadenopathy:    She has no cervical adenopathy.  Neurological: She is alert and oriented to person, place, and time.  Skin: Skin is warm and dry. No rash noted.  Psychiatric: She has a normal mood and affect. Her behavior is normal. Judgment and thought content  normal.      Assessment & Plan:  1. Acute bronchitis, unspecified organism Wheezing present today; no rales/rhonchi present; symptoms of pneumonia improved; and cough has returned. These symptoms are most consistent with bronchitis. VSS, oxygen saturation 97% are reassuring today. With prior episode of pneumonia improved and then cough returned; we reviewed options of fluoroquinolone therapy however exam and history support symptoms most consistent with bronchitis.and we opted to provide azithromycin if no improvement with prednisone taper.  Mucinex DM or benzonatate for cough as needed. Advised her that if symptoms worsen, she should let  us know and follow up for further evaluation. She will obtain follow up chest X-ray as recommended. She declined X-ray today and wanted to follow up after trial of prednisone.  Close return precautions provided.   - predniSONE (DELTASONE) 10 MG tablet; Take 4 tablets daily for 4 days, 3 tabs daily for 2 days, 2 tabs daily for 2 days, and one tab daily for 2 days.  Dispense: 28 tablet; Refill: 0 - benzonatate (TESSALON) 100 MG capsule; Take 1 capsule (100 mg total) by mouth 3 (three) times daily.  Dispense: 20 capsule; Refill: 0 - azithromycin (ZITHROMAX Z-PAK) 250 MG tablet; Take 2 tablets by mouth at once today, then one tablet daily for 4 days  Dispense: 6 each; Refill: 0  Delano Metz, FNP-C

## 2018-03-05 NOTE — Patient Instructions (Signed)
Please take prednisone with food as directed.  Mucinex DM or benzonatate for cough as needed.  If symptoms do not improve with treatment in 2 to 3 days, you can start azithromycin.  Please return for follow up chest X-ray that has been ordered by your provider.   Feel better soon!   Acute Bronchitis, Adult Acute bronchitis is when air tubes (bronchi) in the lungs suddenly get swollen. The condition can make it hard to breathe. It can also cause these symptoms:  A cough.  Coughing up clear, yellow, or green mucus.  Wheezing.  Chest congestion.  Shortness of breath.  A fever.  Body aches.  Chills.  A sore throat.  Follow these instructions at home: Medicines  Take over-the-counter and prescription medicines only as told by your doctor.  If you were prescribed an antibiotic medicine, take it as told by your doctor. Do not stop taking the antibiotic even if you start to feel better. General instructions  Rest.  Drink enough fluids to keep your pee (urine) clear or pale yellow.  Avoid smoking and secondhand smoke. If you smoke and you need help quitting, ask your doctor. Quitting will help your lungs heal faster.  Use an inhaler, cool mist vaporizer, or humidifier as told by your doctor.  Keep all follow-up visits as told by your doctor. This is important. How is this prevented? To lower your risk of getting this condition again:  Wash your hands often with soap and water. If you cannot use soap and water, use hand sanitizer.  Avoid contact with people who have cold symptoms.  Try not to touch your hands to your mouth, nose, or eyes.  Make sure to get the flu shot every year.  Contact a doctor if:  Your symptoms do not get better in 2 weeks. Get help right away if:  You cough up blood.  You have chest pain.  You have very bad shortness of breath.  You become dehydrated.  You faint (pass out) or keep feeling like you are going to pass out.  You  keep throwing up (vomiting).  You have a very bad headache.  Your fever or chills gets worse. This information is not intended to replace advice given to you by your health care provider. Make sure you discuss any questions you have with your health care provider. Document Released: 05/16/2008 Document Revised: 07/06/2016 Document Reviewed: 05/18/2016 Elsevier Interactive Patient Education  [employer].

## 2018-03-13 DIAGNOSIS — R059 Cough, unspecified: Secondary | ICD-10-CM | POA: Insufficient documentation

## 2018-03-13 DIAGNOSIS — R05 Cough: Secondary | ICD-10-CM | POA: Insufficient documentation

## 2018-03-13 NOTE — Assessment & Plan Note (Signed)
Concerning for bacterial infection given length of time of symptoms and comorbidities. Rest, fluids and complete course of antibiotics x 10 days.

## 2018-04-03 ENCOUNTER — Ambulatory Visit
Admission: RE | Admit: 2018-04-03 | Discharge: 2018-04-03 | Disposition: A | Discharge status: Home / Self Care | Payer: Medicare Other | Source: Ambulatory Visit | Attending: Family Medicine | Admitting: Family Medicine

## 2018-04-03 DIAGNOSIS — M8589 Other specified disorders of bone density and structure, multiple sites: Secondary | ICD-10-CM | POA: Diagnosis not present

## 2018-04-03 DIAGNOSIS — Z780 Asymptomatic menopausal state: Secondary | ICD-10-CM | POA: Insufficient documentation

## 2018-04-17 ENCOUNTER — Ambulatory Visit (INDEPENDENT_AMBULATORY_CARE_PROVIDER_SITE_OTHER): Payer: Medicare Other

## 2018-04-17 DIAGNOSIS — J189 Pneumonia, unspecified organism: Secondary | ICD-10-CM | POA: Diagnosis not present

## 2018-04-18 ENCOUNTER — Encounter: Payer: Self-pay | Admitting: Family Medicine

## 2018-04-18 ENCOUNTER — Other Ambulatory Visit: Payer: Self-pay

## 2018-04-18 ENCOUNTER — Ambulatory Visit (INDEPENDENT_AMBULATORY_CARE_PROVIDER_SITE_OTHER): Payer: Medicare Other | Admitting: Family Medicine

## 2018-04-18 VITALS — BP 138/80 | HR 84 | Temp 98.1°F | Ht 66.0 in | Wt 235.8 lb

## 2018-04-18 DIAGNOSIS — I10 Essential (primary) hypertension: Secondary | ICD-10-CM

## 2018-04-18 DIAGNOSIS — R7309 Other abnormal glucose: Secondary | ICD-10-CM | POA: Diagnosis not present

## 2018-04-18 DIAGNOSIS — E785 Hyperlipidemia, unspecified: Secondary | ICD-10-CM | POA: Diagnosis not present

## 2018-04-18 DIAGNOSIS — Z1231 Encounter for screening mammogram for malignant neoplasm of breast: Secondary | ICD-10-CM

## 2018-04-18 DIAGNOSIS — Z1239 Encounter for other screening for malignant neoplasm of breast: Secondary | ICD-10-CM

## 2018-04-18 DIAGNOSIS — K219 Gastro-esophageal reflux disease without esophagitis: Secondary | ICD-10-CM | POA: Diagnosis not present

## 2018-04-18 DIAGNOSIS — Z6838 Body mass index (BMI) 38.0-38.9, adult: Secondary | ICD-10-CM | POA: Diagnosis not present

## 2018-04-18 DIAGNOSIS — M810 Age-related osteoporosis without current pathological fracture: Secondary | ICD-10-CM

## 2018-04-18 DIAGNOSIS — E66812 Obesity, class 2: Secondary | ICD-10-CM

## 2018-04-18 LAB — COMPREHENSIVE METABOLIC PANEL
ALK PHOS: 53 U/L (ref 39–117)
ALT: 16 U/L (ref 0–35)
AST: 17 U/L (ref 0–37)
Albumin: 4 g/dL (ref 3.5–5.2)
BUN: 15 mg/dL (ref 6–23)
CHLORIDE: 100 meq/L (ref 96–112)
CO2: 32 meq/L (ref 19–32)
Calcium: 9.7 mg/dL (ref 8.4–10.5)
Creatinine, Ser: 0.81 mg/dL (ref 0.40–1.20)
GFR: 73.14 mL/min (ref >60.00)
GLUCOSE: 116 mg/dL — AB (ref 70–99)
POTASSIUM: 5.2 meq/L — AB (ref 3.5–5.1)
Sodium: 138 mEq/L (ref 135–145)
Total Bilirubin: 0.5 mg/dL (ref 0.2–1.2)
Total Protein: 7.2 g/dL (ref 6.0–8.3)

## 2018-04-18 LAB — LIPID PANEL
Cholesterol: 212 mg/dL — ABNORMAL HIGH (ref 0–200)
HDL: 77 mg/dL (ref >39.00)
LDL CALC: 117 mg/dL — AB (ref 0–99)
NonHDL: 134.85
Total CHOL/HDL Ratio: 3
Triglycerides: 91 mg/dL (ref 0.0–149.0)
VLDL: 18.2 mg/dL (ref 0.0–40.0)

## 2018-04-18 LAB — HEMOGLOBIN A1C: HEMOGLOBIN A1C: 5.6 % (ref 4.6–6.5)

## 2018-04-18 NOTE — Progress Notes (Signed)
Tommi Rumps, MD Phone: [phone]  Ann Spencer is a 77 y.o. female who presents today for f/u.  HYPERTENSION  Disease Monitoring  Home BP Monitoring 130-150/85 or less Chest pain- no    Dyspnea- no Medications  Compliance-  Taking HCTZ.   Edema- chronic and stable  GERD:   Reflux symptoms: burning, sour taste, 1-2x/week   Abd pain: no   Blood in stool: no  Dysphagia: no   EGD: no   Medication: omeprazole prn Recent chest x-ray with small hiatal hernia.  HLD: She has been working on dietary changes.  Eating more salad and vegetables.  Less cheese.  She is not able to exercise much given her knee though she is getting that taken care of and then she will start to exercise.  She had a bone density scan done that revealed osteopenia though had an elevated FRAX percentage for hip fracture.  She is on calcium and vitamin D.  Follow-up chest x-ray for her pneumonia revealed resolution of prior findings.   Social History   Tobacco Use  Smoking Status Former Smoker  . Years: 30.00  . Last attempt to quit: 12/12/1993  . Years since quitting: 24.3  Smokeless Tobacco Never Used     ROS see history of present illness  Objective  Physical Exam Vitals:   04/18/18 1109  BP: 138/80  Pulse: 84  Temp: 98.1 F (36.7 C)  SpO2: 96%    BP Readings from Last 3 Encounters:  04/18/18 138/80  03/05/18 112/68  02/13/18 134/80   Wt Readings from Last 3 Encounters:  04/18/18 235 lb 12.8 oz (107 kg)  03/05/18 229 lb 3.2 oz (104 kg)  02/13/18 230 lb (104.3 kg)    Physical Exam  Constitutional: No distress.  Cardiovascular: Normal rate, regular rhythm and normal heart sounds.  Pulmonary/Chest: Effort normal and breath sounds normal.  Abdominal: Soft. Bowel sounds are normal. She exhibits no distension. There is no tenderness. There is no rebound and no guarding.  Musculoskeletal: She exhibits no edema.  Neurological: She is alert.  Skin: Skin is warm and dry. She is not  diaphoretic.     Assessment/Plan: Please see individual problem list.  Essential hypertension Adequately controlled.  Check lab work.  Continue HCTZ.  GERD (gastroesophageal reflux disease) Occasional symptoms.  She is using omeprazole as needed.  Her small hiatal hernia may be contributing.  She will continue to monitor and if her symptoms increase she will let us know and she will start on the omeprazole daily.  Osteoporosis Osteoporosis based on FRAX scoring.  Hip fracture percentage is just over the threshold.  Osteopenic by DEXA scan.  Discussed potential for treatment with Fosamax.  She is unsure about this and she was given information to review.  She will let us know if she would like to start this.  She will continue vitamin D and calcium.  Hyperlipidemia Check today.  Continue diet and exercise.  Prior pneumonia has resolved.  Chest x-ray clear of prior findings.  Health Maintenance: Cologuard confirmed negative.  Results will be faxed to [place].  Mammogram scheduled.  Orders Placed This Encounter  Procedures  . MM 3D SCREEN BREAST BILATERAL    Standing Status:   Future    Standing Expiration Date:   06/19/2019    Order Specific Question:   Reason for Exam (SYMPTOM  OR DIAGNOSIS REQUIRED)    Answer:   screening    Order Specific Question:   Preferred imaging location?  Answer:   [facility]  . Comp Met (CMET)  . Lipid panel  . HgB A1c  . Cologuard    This external order was created through the Results Console.    No orders of the defined types were placed in this encounter.    Tommi Rumps, MD [place]

## 2018-04-18 NOTE — Patient Instructions (Signed)
Nice to see you. Please consider whether or not you want to start on Fosamax for your osteopenia. If you develop worsening reflux please start taking the omeprazole daily. Please try to work on dietary changes and exercise as you are able to.

## 2018-04-18 NOTE — Assessment & Plan Note (Signed)
Adequately controlled.  Check lab work.  Continue HCTZ.

## 2018-04-18 NOTE — Assessment & Plan Note (Signed)
Osteoporosis based on FRAX scoring.  Hip fracture percentage is just over the threshold.  Osteopenic by DEXA scan.  Discussed potential for treatment with Fosamax.  She is unsure about this and she was given information to review.  She will let us know if she would like to start this.  She will continue vitamin D and calcium.

## 2018-04-18 NOTE — Assessment & Plan Note (Signed)
Occasional symptoms.  She is using omeprazole as needed.  Her small hiatal hernia may be contributing.  She will continue to monitor and if her symptoms increase she will let us know and she will start on the omeprazole daily.

## 2018-04-18 NOTE — Assessment & Plan Note (Signed)
Check today.  Continue diet and exercise.

## 2018-04-24 ENCOUNTER — Other Ambulatory Visit: Payer: Self-pay | Admitting: Family Medicine

## 2018-04-24 DIAGNOSIS — E875 Hyperkalemia: Secondary | ICD-10-CM

## 2018-04-25 ENCOUNTER — Other Ambulatory Visit: Payer: Self-pay | Admitting: Family Medicine

## 2018-04-25 ENCOUNTER — Telehealth: Payer: Self-pay

## 2018-04-25 DIAGNOSIS — E785 Hyperlipidemia, unspecified: Secondary | ICD-10-CM

## 2018-04-25 MED ORDER — ROSUVASTATIN CALCIUM 20 MG PO TABS: 20.0000 mg | ORAL_TABLET | Freq: Every day | ORAL | 3 refills | Status: DC

## 2018-04-25 NOTE — Telephone Encounter (Signed)
-----   Message from Leone Haven, MD sent at 04/24/2018  6:26 PM EDT ----- Potassium ordered.  It is okay for her to try the Crestor as long as it is not expired.  If it is expired we can send in a new prescription.  She will need recheck of her LDL cholesterol and hepatic function panel in 1 month.  Please place an order for a direct LDL and hepatic function panel for hyperlipidemia.  Please make sure to label it is not needing to be drawn at the same time as her potassium.  Thanks.

## 2018-04-27 ENCOUNTER — Other Ambulatory Visit: Payer: Medicare Other

## 2018-04-30 ENCOUNTER — Other Ambulatory Visit (INDEPENDENT_AMBULATORY_CARE_PROVIDER_SITE_OTHER): Payer: Medicare Other

## 2018-04-30 DIAGNOSIS — E875 Hyperkalemia: Secondary | ICD-10-CM | POA: Diagnosis not present

## 2018-04-30 DIAGNOSIS — E785 Hyperlipidemia, unspecified: Secondary | ICD-10-CM | POA: Diagnosis not present

## 2018-04-30 LAB — POTASSIUM: POTASSIUM: 4.9 meq/L (ref 3.5–5.1)

## 2018-04-30 LAB — HEPATIC FUNCTION PANEL
ALBUMIN: 4 g/dL (ref 3.5–5.2)
ALT: 17 U/L (ref 0–35)
AST: 15 U/L (ref 0–37)
Alkaline Phosphatase: 49 U/L (ref 39–117)
BILIRUBIN DIRECT: 0.1 mg/dL (ref 0.0–0.3)
TOTAL PROTEIN: 7.4 g/dL (ref 6.0–8.3)
Total Bilirubin: 0.5 mg/dL (ref 0.2–1.2)

## 2018-04-30 LAB — LDL CHOLESTEROL, DIRECT: Direct LDL: 95 mg/dL

## 2018-07-27 ENCOUNTER — Ambulatory Visit: Payer: Medicare Other | Admitting: Family Medicine

## 2018-08-28 DIAGNOSIS — S8002XA Contusion of left knee, initial encounter: Secondary | ICD-10-CM | POA: Diagnosis not present

## 2018-09-07 ENCOUNTER — Encounter: Payer: Self-pay | Admitting: Family Medicine

## 2018-09-07 ENCOUNTER — Ambulatory Visit (INDEPENDENT_AMBULATORY_CARE_PROVIDER_SITE_OTHER): Payer: Medicare Other | Admitting: Family Medicine

## 2018-09-07 VITALS — BP 130/78 | HR 73 | Temp 97.8°F | Ht 66.0 in | Wt 238.3 lb

## 2018-09-07 DIAGNOSIS — M25562 Pain in left knee: Secondary | ICD-10-CM

## 2018-09-07 DIAGNOSIS — Z23 Encounter for immunization: Secondary | ICD-10-CM | POA: Diagnosis not present

## 2018-09-07 NOTE — Patient Instructions (Signed)
Nice to see you. We will refer you to orthopedic surgery for evaluation.

## 2018-09-07 NOTE — Assessment & Plan Note (Signed)
Acute on chronic pain.  She is improving.  Given her known arthritis she would benefit from evaluation with orthopedic surgery.  A referral has been placed.

## 2018-09-07 NOTE — Progress Notes (Signed)
  Tommi Rumps, MD Phone: [phone]  Ann Spencer is a 77 y.o. female who presents today for follow-up.  Left knee pain: Patient reports a fall several weeks ago where she tripped on her feet.  She fell onto her left knee.  She did bump her head lightly on the laundry basket though reports no loss of consciousness or headaches.  She reports she was evaluated at urgent care and had an x-ray of her left knee which she states revealed osteoarthritis and no fractures.  She notes that the soreness is improving.  Social History   Tobacco Use  Smoking Status Former Smoker  . Years: 30.00  . Last attempt to quit: 12/12/1993  . Years since quitting: 24.7  Smokeless Tobacco Never Used     ROS see history of present illness  Objective  Physical Exam Vitals:   09/07/18 1357  BP: 130/78  Pulse: 73  Temp: 97.8 F (36.6 C)  SpO2: 95%    BP Readings from Last 3 Encounters:  09/07/18 130/78  04/18/18 138/80  03/05/18 112/68   Wt Readings from Last 3 Encounters:  09/07/18 238 lb 4.8 oz (108.1 kg)  04/18/18 235 lb 12.8 oz (107 kg)  03/05/18 229 lb 3.2 oz (104 kg)    Physical Exam  Constitutional: No distress.  Cardiovascular: Normal rate, regular rhythm and normal heart sounds.  Pulmonary/Chest: Effort normal and breath sounds normal.  Musculoskeletal: She exhibits no edema.  Left knee with no warmth, swelling, or erythema, there is slight soft tissue tenderness inferior to the patella  Neurological: She is alert.  5/5 strength bilateral quads, hamstrings, plantar flexion and dorsiflexion  Skin: Skin is warm and dry. She is not diaphoretic.  Patient is walking with a limp favoring the left leg   Assessment/Plan: Please see individual problem list.  Left knee pain Acute on chronic pain.  She is improving.  Given her known arthritis she would benefit from evaluation with orthopedic surgery.  A referral has been placed.    Orders Placed This Encounter  Procedures  .  Flu vaccine HIGH DOSE PF (Fluzone High dose)  . Ambulatory referral to Orthopedic Surgery    Referral Priority:   Routine    Referral Type:   Surgical    Referral Reason:   Specialty Services Required    Requested Specialty:   Orthopedic Surgery    Number of Visits Requested:   1    No orders of the defined types were placed in this encounter.    Tommi Rumps, MD [place]

## 2018-09-14 DIAGNOSIS — E669 Obesity, unspecified: Secondary | ICD-10-CM | POA: Diagnosis not present

## 2018-09-14 DIAGNOSIS — G8929 Other chronic pain: Secondary | ICD-10-CM | POA: Diagnosis not present

## 2018-09-14 DIAGNOSIS — S8002XA Contusion of left knee, initial encounter: Secondary | ICD-10-CM | POA: Diagnosis not present

## 2018-09-14 DIAGNOSIS — M1712 Unilateral primary osteoarthritis, left knee: Secondary | ICD-10-CM | POA: Diagnosis not present

## 2018-09-14 DIAGNOSIS — M25562 Pain in left knee: Secondary | ICD-10-CM | POA: Diagnosis not present

## 2018-09-14 DIAGNOSIS — S8012XA Contusion of left lower leg, initial encounter: Secondary | ICD-10-CM | POA: Diagnosis not present

## 2019-01-02 ENCOUNTER — Encounter: Payer: Self-pay | Admitting: Family

## 2019-01-02 ENCOUNTER — Ambulatory Visit (INDEPENDENT_AMBULATORY_CARE_PROVIDER_SITE_OTHER): Payer: Medicare Other | Admitting: Family

## 2019-01-02 VITALS — BP 130/82 | HR 92 | Temp 97.5°F | Wt 238.0 lb

## 2019-01-02 DIAGNOSIS — J029 Acute pharyngitis, unspecified: Secondary | ICD-10-CM | POA: Diagnosis not present

## 2019-01-02 LAB — POCT RAPID STREP A (OFFICE): Rapid Strep A Screen: NEGATIVE

## 2019-01-02 NOTE — Progress Notes (Signed)
Subjective:    Patient ID: MAEVEN MCDOUGALL, female    DOB: September 13, 1941, 78 y.o.   MRN: [id number]  CC: TEKEISHA HAKIM is a 78 y.o. female who presents today for an acute visit.    HPI: CC: sore throat x one day, some better.  Thin discharge from nose, sinus pressure.  Painful to swallow .  Tried mucinex last night without much change.   No cough, sob, CP, ear pain, myalgias, trouble swallowing.   Exposed 3 weeks ago to strep from granddaughter   HISTORY:  Past Medical History:  Diagnosis Date  . Allergy   . History of head injury 11/17/2015  . Hypertension   . Phlebitis 2014  . Pneumonia due to infectious organism 02/15/2018  . Rotator cuff injury 12/2013   Past Surgical History:  Procedure Laterality Date  . ABDOMINAL HYSTERECTOMY  1981   Full  . SHOULDER SURGERY Left 03/03/15  . vein closure Bilateral Feb 2008   Family History  Problem Relation Age of Onset  . Varicose Veins Brother     Allergies: Penicillins Current Outpatient Medications on File Prior to Visit  Medication Sig Dispense Refill  . aspirin 81 MG tablet Take 81 mg by mouth daily.    . Calcium Carbonate (CALTRATE 600 PO) Take 1 tablet by mouth daily.    . Cholecalciferol (D3 ADULT PO) Take 1 tablet by mouth daily.    . ferrous sulfate 325 (65 FE) MG EC tablet Take 325 mg by mouth daily with breakfast.    . hydrochlorothiazide (HYDRODIURIL) 12.5 MG tablet Take 1 tablet (12.5 mg total) by mouth daily. 90 tablet 3  . Multiple Vitamin (MULTIVITAMIN) capsule Take 1 capsule by mouth daily.     Current Facility-Administered Medications on File Prior to Visit  Medication Dose Route Frequency Provider Last Rate Last Dose  . lidocaine (PF) (XYLOCAINE) 1 % injection 10 mL  10 mL Other Once Milinda Pointer, MD        Social History   Tobacco Use  . Smoking status: Former Smoker    Years: 30.00    Last attempt to quit: 12/12/1993    Years since quitting: 25.0  . Smokeless tobacco: Never Used  Substance  Use Topics  . Alcohol use: Yes    Alcohol/week: 7.0 standard drinks    Types: 7 Standard drinks or equivalent per week    Comment: Wine   . Drug use: No    Review of Systems  Constitutional: Negative for chills and fever.  HENT: Positive for congestion, postnasal drip, sinus pressure and sore throat. Negative for ear pain, trouble swallowing and voice change.   Respiratory: Negative for cough and shortness of breath.   Cardiovascular: Negative for chest pain and palpitations.  Gastrointestinal: Negative for nausea and vomiting.      Objective:    BP 130/82 (BP Location: Left Arm, Patient Position: Sitting, Cuff Size: Large)   Pulse 92   Temp (!) 97.5 F (36.4 C)   Wt 238 lb (108 kg)   SpO2 96%   BMI 38.41 kg/m    Physical Exam Vitals signs reviewed.  Constitutional:      Appearance: She is well-developed.  HENT:     Head: Normocephalic and atraumatic.     Right Ear: Hearing, tympanic membrane, ear canal and external ear normal. No decreased hearing noted. No drainage, swelling or tenderness. No middle ear effusion. No foreign body. Tympanic membrane is not erythematous or bulging.     Left Ear:  Hearing, tympanic membrane, ear canal and external ear normal. No decreased hearing noted. No drainage, swelling or tenderness.  No middle ear effusion. No foreign body. Tympanic membrane is not erythematous or bulging.     Nose: Nose normal. No rhinorrhea.     Right Sinus: No maxillary sinus tenderness or frontal sinus tenderness.     Left Sinus: No maxillary sinus tenderness or frontal sinus tenderness.     Mouth/Throat:     Pharynx: Uvula midline. No pharyngeal swelling, oropharyngeal exudate, posterior oropharyngeal erythema or uvula swelling.     Tonsils: No tonsillar abscesses.     Comments: Tonsils behind pillars. No exudate Eyes:     Conjunctiva/sclera: Conjunctivae normal.  Cardiovascular:     Rate and Rhythm: Regular rhythm.     Pulses: Normal pulses.     Heart sounds:  Normal heart sounds.  Pulmonary:     Effort: Pulmonary effort is normal.     Breath sounds: Normal breath sounds. No wheezing, rhonchi or rales.  Lymphadenopathy:     Head:     Right side of head: No submental, submandibular, tonsillar, preauricular, posterior auricular or occipital adenopathy.     Left side of head: No submental, submandibular, tonsillar, preauricular, posterior auricular or occipital adenopathy.     Cervical: No cervical adenopathy.  Skin:    General: Skin is warm and dry.  Neurological:     Mental Status: She is alert.  Psychiatric:        Speech: Speech normal.        Behavior: Behavior normal.        Thought Content: Thought content normal.        Assessment & Plan:   1. Sore throat Well appearing. Afebrile. Strep negative. Suspect viral etiology; will treat conservatively. Patient will remain vigilant at home and let me know of any new or worsening symptoms.  - POCT rapid strep A    I am having Michaelyn F. Sowell maintain her ferrous sulfate, multivitamin, Calcium Carbonate (CALTRATE 600 PO), aspirin, Cholecalciferol (D3 ADULT PO), and hydrochlorothiazide. We will continue to administer lidocaine (PF).   No orders of the defined types were placed in this encounter.   Return precautions given.   Risks, benefits, and alternatives of the medications and treatment plan prescribed today were discussed, and patient expressed understanding.   Education regarding symptom management and diagnosis given to patient on AVS.  Continue to follow with Leone Haven, MD for routine health maintenance.   Tomi Bamberger Barna and I agreed with plan.   Mable Paris, FNP

## 2019-01-02 NOTE — Patient Instructions (Signed)
Strep is negative.   Suspect viral sore throat  Salt water gargles, lozenges  Flonase  Let me know of any concerns at all and certainly worsening symptoms.   Sore Throat A sore throat is pain, burning, irritation, or scratchiness in the throat. When you have a sore throat, you may feel pain or tenderness in your throat when you swallow or talk. Many things can cause a sore throat, including:  An infection.  Seasonal allergies.  Dryness in the air.  Irritants, such as smoke or pollution.  Radiation treatment to the area.  Gastroesophageal reflux disease (GERD).  A tumor. A sore throat is often the first sign of another sickness. It may happen with other symptoms, such as coughing, sneezing, fever, and swollen neck glands. Most sore throats go away without medical treatment. Follow these instructions at home:      Take over-the-counter medicines only as told by your health care provider. ? If your child has a sore throat, do not give your child aspirin because of the association with Reye syndrome.  Drink enough fluids to keep your urine pale yellow.  Rest as needed.  To help with pain, try: ? Sipping warm liquids, such as broth, herbal tea, or warm water. ? Eating or drinking cold or frozen liquids, such as frozen ice pops. ? Gargling with a salt-water mixture 3-4 times a day or as needed. To make a salt-water mixture, completely dissolve -1 tsp (3-6 g) of salt in 1 cup (237 mL) of warm water. ? Sucking on hard candy or throat lozenges. ? Putting a cool-mist humidifier in your bedroom at night to moisten the air. ? Sitting in the bathroom with the door closed for 5-10 minutes while you run hot water in the shower.  Do not use any products that contain nicotine or tobacco, such as cigarettes, e-cigarettes, and chewing tobacco. If you need help quitting, ask your health care provider.  Wash your hands well and often with soap and water. If soap and water are not  available, use hand sanitizer. Contact a health care provider if:  You have a fever for more than 2-3 days.  You have symptoms that last (are persistent) for more than 2-3 days.  Your throat does not get better within 7 days.  You have a fever and your symptoms suddenly get worse.  Your child who is 3 months to 78 years old has a temperature of 102.56F (39C) or higher. Get help right away if:  You have difficulty breathing.  You cannot swallow fluids, soft foods, or your saliva.  You have increased swelling in your throat or neck.  You have persistent nausea and vomiting. Summary  A sore throat is pain, burning, irritation, or scratchiness in the throat. Many things can cause a sore throat.  Take over-the-counter medicines only as told by your health care provider. Do not give your child aspirin.  Drink plenty of fluids, and rest as needed.  Contact a health care provider if your symptoms worsen or your sore throat does not get better within 7 days. This information is not intended to replace advice given to you by your health care provider. Make sure you discuss any questions you have with your health care provider. Document Released: 01/05/2005 Document Revised: 04/30/2018 Document Reviewed: 04/30/2018 Elsevier Interactive Patient Education  2019 [employer].

## 2019-01-04 ENCOUNTER — Telehealth: Payer: Self-pay | Admitting: Family Medicine

## 2019-01-04 DIAGNOSIS — J019 Acute sinusitis, unspecified: Secondary | ICD-10-CM

## 2019-01-04 MED ORDER — DOXYCYCLINE HYCLATE 100 MG PO TABS
100.0000 mg | ORAL_TABLET | Freq: Two times a day (BID) | ORAL | 0 refills | Status: DC
Start: 2019-01-04 — End: 2019-06-12

## 2019-01-04 NOTE — Telephone Encounter (Signed)
I called patient, but was unable to leave message to call back.

## 2019-01-04 NOTE — Telephone Encounter (Signed)
Patient notified of below & was picking up from pharmacy.

## 2019-01-04 NOTE — Telephone Encounter (Signed)
Call patient I think she meant to say  antibiotic.  Explain to her that Tamiflu is used in cases in the first 24 hours when we diagnosed influenza.    In this case, if she is not any better I would not use antiviral (Tamiflu), I would use antibiotic.    Since you are allergic to penicillin, I called in doxycycline.  Please let me know if you are not better on this medication  Ensure to take probiotics while on antibiotics and also for 2 weeks after completion. It is important to re-colonize the gut with good bacteria and also to prevent any diarrheal infections associated with antibiotic use.

## 2019-01-04 NOTE — Telephone Encounter (Signed)
Copied from [place] [id number]. Topic: General - Other >> Jan 04, 2019 10:02 AM Lennox Solders wrote: Reason for CRM: pt saw margaret arnett on 01-02-2019 and per pt she said margaret told her if she is not better she would call in tamiflu . Pt is not better still has sore throat, cough and now achy . [facility] [address]

## 2019-01-08 ENCOUNTER — Ambulatory Visit (INDEPENDENT_AMBULATORY_CARE_PROVIDER_SITE_OTHER): Payer: Medicare Other

## 2019-01-08 ENCOUNTER — Other Ambulatory Visit: Payer: Self-pay

## 2019-01-08 VITALS — BP 122/82 | HR 90 | Temp 97.5°F | Resp 16 | Ht 64.5 in | Wt 233.8 lb

## 2019-01-08 DIAGNOSIS — Z0000 Encounter for general adult medical examination without abnormal findings: Secondary | ICD-10-CM

## 2019-01-08 NOTE — Patient Instructions (Addendum)
  Ann Spencer , Thank you for taking time to come for your Medicare Wellness Visit. I appreciate your ongoing commitment to your health goals. Please review the following plan we discussed and let me know if I can assist you in the future.   Schedule a Follow up by 03/07/18 with pcp.     Bring a copy of your [place] and/or Living Will to be scanned into chart upon completion.   Schedule eye exam.   Have a great day!  These are the goals we discussed: Goals      Patient Stated   . Weight (lb) < 233 lb 12.8 oz (106.1 kg) (pt-stated)     Drink less wine Low cholesterol/low carb diet       This is a list of the screening recommended for you and due dates:  Health Maintenance  Topic Date Due  . Pneumonia vaccines (2 of 2 - PPSV23) 02/20/2016  . Tetanus Vaccine  12/22/2023  . Flu Shot  Completed  . DEXA scan (bone density measurement)  Completed

## 2019-01-08 NOTE — Progress Notes (Signed)
Subjective:   Ann Spencer is a 78 y.o. female who presents for an Initial Medicare Annual Wellness Visit.  Review of Systems    No ROS.  Medicare Wellness Visit. Additional risk factors are reflected in the social history.  Cardiac Risk Factors include: advanced age (>59men, >53 women);hypertension     Objective:    Today's Vitals   01/08/19 1111  BP: 122/82  Pulse: 90  Resp: 16  Temp: (!) 97.5 F (36.4 C)  TempSrc: Oral  SpO2: 94%  Weight: 233 lb 12.8 oz (106.1 kg)  Height: 5' 4.5" (1.638 m)   Body mass index is 39.51 kg/m.  Advanced Directives 01/08/2019 12/14/2016 11/23/2016 11/09/2016 10/24/2016 10/04/2016 09/29/2016  Does Patient Have a Medical Advance Directive? No No No No No No No  Would patient like information on creating a medical advance directive? Yes (MAU/Ambulatory/Procedural Areas - Information given) - No - Patient declined - - - -    Current Medications (verified) Outpatient Encounter Medications as of 01/08/2019  Medication Sig  . aspirin 81 MG tablet Take 81 mg by mouth daily.  . Calcium Carbonate (CALTRATE 600 PO) Take 1 tablet by mouth daily.  . Cholecalciferol (D3 ADULT PO) Take 1 tablet by mouth daily.  Marland Kitchen doxycycline (VIBRA-TABS) 100 MG tablet Take 1 tablet (100 mg total) by mouth 2 (two) times daily.  . ferrous sulfate 325 (65 FE) MG EC tablet Take 325 mg by mouth daily with breakfast.  . hydrochlorothiazide (HYDRODIURIL) 12.5 MG tablet Take 1 tablet (12.5 mg total) by mouth daily.  . Multiple Vitamin (MULTIVITAMIN) capsule Take 1 capsule by mouth daily.   Facility-Administered Encounter Medications as of 01/08/2019  Medication  . lidocaine (PF) (XYLOCAINE) 1 % injection 10 mL    Allergies (verified) Penicillins   History: Past Medical History:  Diagnosis Date  . Allergy   . History of head injury 11/17/2015  . Hypertension   . Phlebitis 2014  . Pneumonia due to infectious organism 02/15/2018  . Rotator cuff injury 12/2013   Past  Surgical History:  Procedure Laterality Date  . ABDOMINAL HYSTERECTOMY  1981   Full  . SHOULDER SURGERY Left 03/03/15  . vein closure Bilateral Feb 2008   Family History  Problem Relation Age of Onset  . Varicose Veins Brother    Social History   Socioeconomic History  . Marital status: Widowed    Spouse name: Not on file  . Number of children: Not on file  . Years of education: Not on file  . Highest education level: Not on file  Occupational History  . Not on file  Social Needs  . Financial resource strain: Not on file  . Food insecurity:    Worry: Not on file    Inability: Not on file  . Transportation needs:    Medical: Not on file    Non-medical: Not on file  Tobacco Use  . Smoking status: Former Smoker    Years: 30.00    Last attempt to quit: 12/12/1993    Years since quitting: 25.0  . Smokeless tobacco: Never Used  Substance and Sexual Activity  . Alcohol use: Yes    Alcohol/week: 7.0 standard drinks    Types: 7 Standard drinks or equivalent per week    Comment: Wine   . Drug use: No  . Sexual activity: Never  Lifestyle  . Physical activity:    Days per week: Not on file    Minutes per session: Not on file  .  Stress: Not on file  Relationships  . Social connections:    Talks on phone: Not on file    Gets together: Not on file    Attends religious service: Not on file    Active member of club or organization: Not on file    Attends meetings of clubs or organizations: Not on file    Relationship status: Not on file  Other Topics Concern  . Not on file  Social History Narrative   [place]   Lives with daughter and grandaughter    2 dogs lives inside   Enjoys reading    Tobacco Counseling Counseling given: Not Answered   Clinical Intake:  Pre-visit preparation completed: Yes  Pain : No/denies pain    Diabetes: No  How often do you need to have someone help you when you read instructions, pamphlets, or other written materials  from your doctor or pharmacy?: 1 - Never  Interpreter Needed?: No    Activities of Daily Living In your present state of health, do you have any difficulty performing the following activities: 01/08/2019  Hearing? N  Vision? N  Difficulty concentrating or making decisions? N  Walking or climbing stairs? Y  Comment Unsteady gait. Cane in use with ambulation.  Dressing or bathing? N  Doing errands, shopping? N  Preparing Food and eating ? N  Using the Toilet? N  In the past six months, have you accidently leaked urine? Y  Comment Managed with daily liner  Do you have problems with loss of bowel control? N  Managing your Medications? N  Managing your Finances? N  Housekeeping or managing your Housekeeping? N  Some recent data might be hidden   Immunizations and Health Maintenance Immunization History  Administered Date(s) Administered  . Influenza, High Dose Seasonal PF 01/10/2017, 09/04/2017, 09/07/2018  . Pneumococcal Conjugate-13 02/20/2015  . Tdap 12/21/2013   Health Maintenance Due  Topic Date Due  . PNA vac Low Risk Adult (2 of 2 - PPSV23) 02/20/2016    Patient Care Team: Leone Haven, MD as PCP - General (Family Medicine) Christene Lye, MD (General Surgery) Leward Quan, MD (Family Medicine) Rubbie Battiest, RN as Nurse Practitioner (Gerontology)  Indicate any recent Medical Services you may have received from other than [facility] providers in the past year (date may be approximate).     Assessment:   This is a routine wellness examination for Ann Spencer.  Health Screenings  Mammogram -05/11/17 Cologuard- 07/26/17 Bone Density- 04/03/18 Glaucoma- none Hearing -demonstrates normal hearing in conversation Hemoglobin A1C -04/18/18 (5.6) Cholesterol -04/18/18 (212) Dental- dentures  Social  Alcohol intake -yes, 8-10 glasses of wine per week Smoking history- -none Smokers in home? none Illicit drug use? none Exercise-walking  Diet -low cholesterol  Sexually  Active -never  Safety  Patient feels safe at home.  Patient does have smoke detectors at home  Patient does wear sunscreen or protective clothing when in direct sunlight  Patient does wear seat belt when driving or riding with others.   Activities of Daily Living Patient can do their own household chores. Denies needing assistance with: driving, feeding themselves, getting from bed to chair, getting to the toilet, bathing/showering, dressing, managing money, climbing flight of stairs, or preparing meals.   Depression Screen Patient denies losing interest in daily life, feeling hopeless, or crying easily over simple problems.   Fall Screen Patient denies being afraid of falling or falling in the last year.   Memory Screen Patient denies problems  with memory, misplacing items, and is able to balance checkbook/bank accounts.  Patient is alert, normal appearance, oriented to person/place/and time. Correctly identified the president of the [place], recall of 2/3 objects, and performing simple calculations.  Patient displays appropriate judgement and can read correct time from watch face.   Immunizations The following Immunizations are up to date: Influenza, shingles, pneumonia, and tetanus.  Pneumonia 23 discussed.   Other Providers Patient Care Team: Leone Haven, MD as PCP - General (Family Medicine) Christene Lye, MD (General Surgery) Leward Quan, MD (Family Medicine) Rubbie Battiest, RN as Nurse Practitioner (Gerontology)  Hearing/Vision screen  Visual Acuity Screening   Right eye Left eye Both eyes  Without correction:   20/200  With correction:   20/50  Comments: Follow[facility] by [facility] Wears corrective lenses   Hearing Screening Comments: Patient is able to hear conversational tones without difficulty.  No issues reported.   Dietary issues and exercise activities discussed: Current Exercise Habits: Home exercise routine, Type of exercise: walking,  Intensity: Mild  Goals      Patient Stated   . Weight (lb) < 233 lb 12.8 oz (106.1 kg) (pt-stated)     Drink less wine Low cholesterol/low carb diet      Depression Screen PHQ 2/9 Scores 01/08/2019 02/13/2018 12/14/2016 11/09/2016 10/24/2016 10/04/2016 09/29/2016  PHQ - 2 Score 0 0 0 0 0 0 0  PHQ- 9 Score - - - - - - -    Fall Risk Fall Risk  01/08/2019 02/13/2018 12/14/2016 11/09/2016 10/24/2016  Falls in the past year? 1 No No No -  Comment No falls since the last in Sept 2019 which was followed by pcp and ortho.  - - - -  Number falls in past yr: - - - - 2 or more  Injury with Fall? - - - - No  Risk Factor Category  - - - - High Fall Risk  Risk for fall due to : - - - - Impaired balance/gait;History of fall(s)  Risk for fall due to: Comment - - - - -  Follow up - - - - Education provided;Falls prevention discussed       6CIT Screen 01/08/2019  What Year? 0 points  What month? 0 points  What time? 0 points  Count back from 20 0 points  Months in reverse 0 points  Repeat phrase 0 points  Total Score 0   Screening Tests Health Maintenance  Topic Date Due  . PNA vac Low Risk Adult (2 of 2 - PPSV23) 02/20/2016  . TETANUS/TDAP  12/22/2023  . INFLUENZA VACCINE  Completed  . DEXA SCAN  Completed     Plan:    End of life planning; Advance aging; Advanced directives discussed. Copy of current HCPOA/Living Will requested upon completion.    I have personally reviewed and noted the following in the patient's chart:   . Medical and social history . Use of alcohol, tobacco or illicit drugs  . Current medications and supplements . Functional ability and status . Nutritional status . Physical activity . Advanced directives . List of other physicians . Hospitalizations, surgeries, and ER visits in previous 12 months . Vitals . Screenings to include cognitive, depression, and falls . Referrals and appointments  In addition, I have reviewed and discussed with patient certain  preventive protocols, quality metrics, and best practice recommendations. A written personalized care plan for preventive services as well as general preventive health recommendations were provided to patient.  Varney Biles, LPN   [id number]

## 2019-01-10 NOTE — Progress Notes (Signed)
I have reviewed the above note and agree.  Erykah Lippert, M.D.  

## 2019-01-24 DIAGNOSIS — M25551 Pain in right hip: Secondary | ICD-10-CM | POA: Diagnosis not present

## 2019-01-24 DIAGNOSIS — E669 Obesity, unspecified: Secondary | ICD-10-CM | POA: Diagnosis not present

## 2019-01-24 DIAGNOSIS — M7061 Trochanteric bursitis, right hip: Secondary | ICD-10-CM | POA: Diagnosis not present

## 2019-03-05 ENCOUNTER — Telehealth: Payer: Self-pay

## 2019-03-05 NOTE — Telephone Encounter (Signed)
Pt called requesting for her scheduled office visit for Friday 03/08/19 be changed to an e-visit.

## 2019-03-05 NOTE — Telephone Encounter (Signed)
Called and spoke with pt. Pt advised that she has been scheduled for a virtual visit appt and will need to access email sent to [email or web address]. Pt confirmed dthis is the correct email to use. Sent to PCP as an Juluis Rainier this has been done.

## 2019-03-08 ENCOUNTER — Other Ambulatory Visit: Payer: Self-pay

## 2019-03-08 ENCOUNTER — Encounter: Payer: Self-pay | Admitting: Family Medicine

## 2019-03-08 ENCOUNTER — Telehealth: Payer: Self-pay | Admitting: Family Medicine

## 2019-03-08 ENCOUNTER — Ambulatory Visit (INDEPENDENT_AMBULATORY_CARE_PROVIDER_SITE_OTHER): Payer: Medicare Other | Admitting: Family Medicine

## 2019-03-08 DIAGNOSIS — M810 Age-related osteoporosis without current pathological fracture: Secondary | ICD-10-CM

## 2019-03-08 DIAGNOSIS — I10 Essential (primary) hypertension: Secondary | ICD-10-CM | POA: Diagnosis not present

## 2019-03-08 DIAGNOSIS — J309 Allergic rhinitis, unspecified: Secondary | ICD-10-CM | POA: Diagnosis not present

## 2019-03-08 NOTE — Progress Notes (Signed)
This visit type was conducted due to national recommendations for restrictions regarding [place] COVID-19 pandemic (e.g. social distancing).  This format is felt to be most appropriate for this patient at this time.  All issues noted in this document were discussed and addressed.  No physical exam was performed (except for noted visual exam findings with Video Visits).    Virtual Visit via Video Note  I connected with Ann Spencer on 03/08/19 at  2:45 PM EDT by a video enabled telemedicine application and verified that I am speaking with Spencer correct person using two identifiers.  Location patient: home Location provider:work Persons participating in Spencer virtual visit: patient, provider  I discussed Spencer limitations of evaluation and management by telemedicine and Spencer availability of in person appointments. Spencer patient expressed understanding and agreed to proceed.  Patient Ann Spencer) was at home and I Tommi Rumps) was in [place] office.   HPI: HYPERTENSION  Disease Monitoring  Home BP Monitoring 158/83 this am Chest pain- no    Dyspnea- no Medications  Compliance-  Taking HCTZ.   Edema- no  Osteoporosis: no fractures. Taking a multivitamin. Not on vitamin D. She is taking calcium.   Allergic rhinitis: notes mild congestion with rhinorrhea for Spencer past week with sneezing.  Notes this is related to Spencer pollen.  She notes no fever, sore throat, achiness, travel, or exposure to anyone with coronavirus.  She has not been out in public since March 14.  She does walk around outside though there is nobody living within 10 acres of her.  She has not been taking any Tylenol or ibuprofen.  She has been using Flonase with good benefit.     ROS: See pertinent positives and negatives per HPI.  Past Medical History:  Diagnosis Date  . Allergy   . History of head injury 11/17/2015  . Hypertension   . Phlebitis 2014  . Pneumonia due to infectious organism 02/15/2018  . Rotator cuff injury 12/2013     Past Surgical History:  Procedure Laterality Date  . ABDOMINAL HYSTERECTOMY  1981   Full  . SHOULDER SURGERY Left 03/03/15  . vein closure Bilateral Feb 2008    Family History  Problem Relation Age of Onset  . Varicose Veins Brother     SOCIAL HX: Former smoker.   Current Outpatient Medications:  .  aspirin 81 MG tablet, Take 81 mg by mouth daily., Disp: , Rfl:  .  Calcium Carbonate (CALTRATE 600 PO), Take 1 tablet by mouth daily., Disp: , Rfl:  .  Cholecalciferol (D3 ADULT PO), Take 1 tablet by mouth daily., Disp: , Rfl:  .  doxycycline (VIBRA-TABS) 100 MG tablet, Take 1 tablet (100 mg total) by mouth 2 (two) times daily., Disp: 10 tablet, Rfl: 0 .  ferrous sulfate 325 (65 FE) MG EC tablet, Take 325 mg by mouth daily with breakfast., Disp: , Rfl:  .  fluticasone (FLONASE) 50 MCG/ACT nasal spray, Place into both nostrils daily., Disp: , Rfl:  .  hydrochlorothiazide (HYDRODIURIL) 12.5 MG tablet, Take 1 tablet (12.5 mg total) by mouth daily., Disp: 90 tablet, Rfl: 3 .  Multiple Vitamin (MULTIVITAMIN) capsule, Take 1 capsule by mouth daily., Disp: , Rfl:  .  NON FORMULARY, vicks cough and congestion dm, Disp: , Rfl:   Current Facility-Administered Medications:  .  lidocaine (PF) (XYLOCAINE) 1 % injection 10 mL, 10 mL, Other, Once, Milinda Pointer, MD  EXAM:  VITALS per patient if applicable:  GENERAL: alert, oriented, appears well and in  no acute distress  HEENT: atraumatic, conjunttiva clear, no obvious abnormalities on inspection of external nose and ears  NECK: normal movements of Spencer head and neck  LUNGS: on inspection no signs of respiratory distress, breathing rate appears normal, no obvious gross SOB, gasping or wheezing  CV: no obvious cyanosis  MS: moves all visible extremities without noticeable abnormality  PSYCH/NEURO: pleasant and cooperative, no obvious depression or anxiety, speech and thought processing grossly intact  ASSESSMENT AND  PLAN:  Discussed Spencer following assessment and plan:  Essential hypertension  Age-related osteoporosis without current pathological fracture  Allergic rhinitis, unspecified seasonality, unspecified trigger  Essential hypertension Above goal.  Discussed monitoring her blood pressure daily for Spencer next week and calling us with readings.  We will determine change in management at that time.  Osteoporosis She will start on vitamin D.  She will continue calcium.  She will monitor.  Allergic rhinitis Symptoms are likely related to allergic rhinitis though I did discuss 14-day isolation with her given concerns with coronavirus in [place] community.  She noted she would not be going out into public.  She will monitor her symptoms and continue Flonase.  If worsening she will let us know.     I discussed Spencer assessment and treatment plan with Spencer patient. Spencer patient was provided an opportunity to ask questions and all were answered. Spencer patient agreed with Spencer plan and demonstrated an understanding of Spencer instructions.   S[place] patient was advised to call back or seek an in-person evaluation if Spencer symptoms worsen or if Spencer condition fails to improve as anticipated.  I provided 25 minutes of non-face-to-face time during this encounter.   Tommi Rumps, MD

## 2019-03-08 NOTE — Telephone Encounter (Signed)
I saw the patient for a telehealth visit today.  Please contact the patient and see if you can get her set up for 20-month follow-up in the office for her blood pressure.

## 2019-03-08 NOTE — Assessment & Plan Note (Signed)
Above goal.  Discussed monitoring her blood pressure daily for the next week and calling us with readings.  We will determine change in management at that time.

## 2019-03-08 NOTE — Telephone Encounter (Signed)
Called and spoke with pt. Pt has been scheduled for an appt for July 29,2020 @ 1:45 PM

## 2019-03-08 NOTE — Assessment & Plan Note (Signed)
She will start on vitamin D.  She will continue calcium.  She will monitor.

## 2019-03-08 NOTE — Assessment & Plan Note (Signed)
Symptoms are likely related to allergic rhinitis though I did discuss 14-day isolation with her given concerns with coronavirus in the community.  She noted she would not be going out into public.  She will monitor her symptoms and continue Flonase.  If worsening she will let us know.

## 2019-04-23 ENCOUNTER — Ambulatory Visit: Payer: Self-pay

## 2019-04-23 ENCOUNTER — Other Ambulatory Visit: Payer: Self-pay

## 2019-04-23 ENCOUNTER — Emergency Department
Admission: EM | Admit: 2019-04-23 | Discharge: 2019-04-23 | Disposition: A | Discharge status: Home / Self Care | Payer: Medicare Other | Attending: Student in an Organized Health Care Education/Training Program | Admitting: Student in an Organized Health Care Education/Training Program

## 2019-04-23 ENCOUNTER — Emergency Department: Payer: Medicare Other

## 2019-04-23 DIAGNOSIS — R232 Flushing: Secondary | ICD-10-CM | POA: Diagnosis not present

## 2019-04-23 DIAGNOSIS — Z7982 Long term (current) use of aspirin: Secondary | ICD-10-CM | POA: Insufficient documentation

## 2019-04-23 DIAGNOSIS — R200 Anesthesia of skin: Secondary | ICD-10-CM | POA: Diagnosis not present

## 2019-04-23 DIAGNOSIS — R55 Syncope and collapse: Secondary | ICD-10-CM | POA: Diagnosis present

## 2019-04-23 DIAGNOSIS — Z87891 Personal history of nicotine dependence: Secondary | ICD-10-CM | POA: Diagnosis not present

## 2019-04-23 DIAGNOSIS — Z79899 Other long term (current) drug therapy: Secondary | ICD-10-CM | POA: Diagnosis not present

## 2019-04-23 DIAGNOSIS — I10 Essential (primary) hypertension: Secondary | ICD-10-CM | POA: Insufficient documentation

## 2019-04-23 LAB — COMPREHENSIVE METABOLIC PANEL
ALT: 29 U/L (ref 0–44)
AST: 30 U/L (ref 15–41)
Albumin: 4 g/dL (ref 3.5–5.0)
Alkaline Phosphatase: 70 U/L (ref 38–126)
Anion gap: 7 (ref 5–15)
BUN: 14 mg/dL (ref 8–23)
CO2: 31 mmol/L (ref 22–32)
Calcium: 9.8 mg/dL (ref 8.9–10.3)
Chloride: 100 mmol/L (ref 98–111)
Creatinine, Ser: 0.71 mg/dL (ref 0.44–1.00)
GFR calc Af Amer: >60 mL/min (ref >60)
GFR calc non Af Amer: >60 mL/min (ref >60)
Glucose, Bld: 109 mg/dL — ABNORMAL HIGH (ref 70–99)
Potassium: 5.1 mmol/L (ref 3.5–5.1)
Sodium: 138 mmol/L (ref 135–145)
Total Bilirubin: 0.7 mg/dL (ref 0.3–1.2)
Total Protein: 7.5 g/dL (ref 6.5–8.1)

## 2019-04-23 LAB — CBC WITH DIFFERENTIAL/PLATELET
Abs Immature Granulocytes: 0.02 10*3/uL (ref 0.00–0.07)
Basophils Absolute: 0 10*3/uL (ref 0.0–0.1)
Basophils Relative: 1 %
Eosinophils Absolute: 0.1 10*3/uL (ref 0.0–0.5)
Eosinophils Relative: 3 %
HCT: 44.2 % (ref 36.0–46.0)
Hemoglobin: 14.9 g/dL (ref 12.0–15.0)
Immature Granulocytes: 1 %
Lymphocytes Relative: 23 %
Lymphs Abs: 1 10*3/uL (ref 0.7–4.0)
MCH: 33 pg (ref 26.0–34.0)
MCHC: 33.7 g/dL (ref 30.0–36.0)
MCV: 97.8 fL (ref 80.0–100.0)
Monocytes Absolute: 0.4 10*3/uL (ref 0.1–1.0)
Monocytes Relative: 9 %
Neutro Abs: 2.7 10*3/uL (ref 1.7–7.7)
Neutrophils Relative %: 63 %
Platelets: 172 10*3/uL (ref 150–400)
RBC: 4.52 MIL/uL (ref 3.87–5.11)
RDW: 11.8 % (ref 11.5–15.5)
WBC: 4.2 10*3/uL (ref 4.0–10.5)
nRBC: 0 % (ref 0.0–0.2)

## 2019-04-23 MED ORDER — ASPIRIN 81 MG PO CHEW
324.0000 mg | CHEWABLE_TABLET | Freq: Once | ORAL | Status: AC
  Administered 2019-04-23: 20:00:00 324 mg via ORAL
  Filled 2019-04-23: qty 4

## 2019-04-23 MED ORDER — AMLODIPINE BESYLATE 5 MG PO TABS
2.5000 mg | ORAL_TABLET | Freq: Once | ORAL | Status: AC
  Administered 2019-04-23: 2.5 mg via ORAL
  Filled 2019-04-23: qty 1

## 2019-04-23 MED ORDER — LORAZEPAM 2 MG/ML IJ SOLN
0.5000 mg | Freq: Once | INTRAMUSCULAR | Status: AC
  Administered 2019-04-23: 0.5 mg via INTRAVENOUS
  Filled 2019-04-23: qty 1

## 2019-04-23 NOTE — ED Notes (Signed)
Pt leaving for MRI.  

## 2019-04-23 NOTE — ED Notes (Addendum)
Pt denies any needs. Rail up. Bed in lowest position. Call bell within reach.

## 2019-04-23 NOTE — ED Notes (Addendum)
Pt touches mouth with L hand finger not nose when eyes closed and commanded to touch nose. R hand finger able to touch nose. Otherwise, strength and sensory WDL bilat. Pt has bilat swelling in legs.

## 2019-04-23 NOTE — ED Notes (Signed)
Pt back from MRI 

## 2019-04-23 NOTE — Discharge Instructions (Signed)
Please follow up with Dr. Caryl Bis and I have given you a referral to a neurologist.  Please return to the ER for any worsening symptoms questions or concerns.

## 2019-04-23 NOTE — ED Notes (Signed)
MRI screening pt 

## 2019-04-23 NOTE — Telephone Encounter (Signed)
Outgoing call to Patient  Whose Daughter states that yesterday her mother the( Patient) experienced an hot flash and tingling in the hands and legs felt weak.  B/P was low and felt weak.  Patient felt fatigue Rested the rest of the afternoon.   States the B/P was 141/72, HR was 72.Staes that Patient feels better today.  Phone call to [facility] location.  For virtual appointment  Office recommended Patient go to Urgent Care for a Cat scan. Per  Dr.  Biagio Quint.  Related message to Patient daughter. Voiced understanding.     Ann Spencer Female, 78 y.o., 22-May-1941 MRN:  [id number] Phone:  [phone] Ann Spencer) PCP:  Leone Haven, MD Primary Cvg:  [place] PART A AND B Next Appt With Family Medicine 07/10/2019 at 1:45 PM Message from Ann Spencer sent at 04/23/2019 11:41 AM EDT   Summary: Call back   The patients daughter said that the patient had a really bad hot flash , Tingling in her hands and legs, and felt weak and uneasy. She had no temp but her blood pressure was low. She looked flushed.         Call History    Type Contact  04/23/2019 11:38 AM Phone (Incoming) Ann Spencer (Emergency Contact)  Phone: [phone]  User: Ann Spencer

## 2019-04-23 NOTE — Telephone Encounter (Signed)
Outcgoing call to Patient.  Daughter answers the phone.  State she is the one who called.States her mother was experiencing nubumbness  and weakness yesterday.  Feels normal today.  Provided Care advice today. Patient and daughter voiced voiced understanding.    Reason for Disposition . [1] Numbness (i.e., loss of sensation) of the face, arm / hand, or leg / foot on one side of the body AND [2] gradual onset (e.g., days to weeks) AND [3] present now  Answer Assessment - Initial Assessment Questions 1. SYMPTOM: "What is the main symptom you are concerned about?" (e.g., weakness, numbness)     Weakness and numbness 2. ONSET: "When did this start?" (minutes, hours, days; while sleeping)     yesterday 3. LAST NORMAL: "When was the last time you were normal (no symptoms)?"     Sunday afternoon 4. PATTERN "Does this come and go, or has it been constant since it started?"  "Is it present now?"    Only episode was yesterday normal today 5. CARDIAC SYMPTOMS: "Have you had any of the following symptoms: chest pain, difficulty breathing, palpitations?"     Denies 6. NEUROLOGIC SYMPTOMS: "Have you had any of the following symptoms: headache, dizziness, vision loss, double vision, changes in speech, unsteady on your feet?"     normal 7. OTHER SYMPTOMS: "Do you have any other symptoms?"     denies 8. PREGNANCY: "Is there any chance you are pregnant?" "When was your last menstrual period?"     na  Protocols used: NEUROLOGIC DEFICIT-A-AH

## 2019-04-23 NOTE — ED Notes (Signed)
Yesterday pt experienced severe hot flash with numbness in arms/legs and was seeing flashes of lights. Pt states no remaining numbness or weakness in anything except L eye. States history of L hand weakness but this is chronic.

## 2019-04-23 NOTE — ED Notes (Signed)
MRI states they are ready for pt once med given.

## 2019-04-23 NOTE — ED Notes (Signed)
Pt states she gets very anxious with MRI machines and is requesting something to calm her down. Will notify EDP/PA.

## 2019-04-23 NOTE — ED Notes (Signed)
Pt states took BP meds this morning.

## 2019-04-23 NOTE — ED Provider Notes (Signed)
[facility] Emergency Department Provider Note ____________________________________________   None    (approximate)  I have reviewed the triage vital signs and the nursing notes.   HISTORY  Chief Complaint Near Syncope   HPI Ann Spencer is a 78 y.o. female who presents to the emergency department for treatment and evaluation of inability to open the left eye independently of the right. She is able to blink equally. This started yesterday after having an episode she describes as a "hot flash" with facial erythema and then bilateral hand numbness and tingling. Hand symptoms and facial erythema with sweating lasted about 10 minutes and resolved without intervention. Eye issue remains unresolved. She was advised by her PCP to come to the ED.Marland Kitchen    Past Medical History:  Diagnosis Date  . Allergy   . History of head injury 11/17/2015  . Hypertension   . Phlebitis 2014  . Pneumonia due to infectious organism 02/15/2018  . Rotator cuff injury 12/2013    Patient Active Problem List   Diagnosis Date Noted  . Allergic rhinitis 03/08/2019  . Left knee pain 09/07/2018  . Hyperlipidemia 02/15/2018  . Venous insufficiency 03/27/2017  . Obesity 03/27/2017  . Osteoarthritis of knee (Left) 11/23/2016  . GERD (gastroesophageal reflux disease) 09/22/2016  . Chronic knee pain (Bilateral) (L>R) 11/17/2015  . Chronic pain 11/17/2015  . Tricompartment osteoarthritis of knee (Left) 11/17/2015  . Knee derangement (Left) 11/17/2015  . Chronic low back pain 11/17/2015  . Chronic neck pain 11/17/2015  . Cervical spondylosis 11/17/2015  . Lumbar spondylosis 11/17/2015  . Osteoporosis 11/17/2015  . Atherosclerotic peripheral vascular disease ([place]) 11/17/2015  . Essential hypertension 02/24/2015  . Rotator cuff tear (Left) 02/24/2015  . Varicose veins of both lower extremities 09/03/2013    Past Surgical History:  Procedure Laterality Date  . ABDOMINAL HYSTERECTOMY   1981   Full  . SHOULDER SURGERY Left 03/03/15  . vein closure Bilateral Feb 2008    Prior to Admission medications   Medication Sig Start Date End Date Taking? Authorizing Provider  aspirin 81 MG tablet Take 81 mg by mouth daily.    [provider]  Calcium Carbonate (CALTRATE 600 PO) Take 1 tablet by mouth daily.    [provider]  Cholecalciferol (D3 ADULT PO) Take 1 tablet by mouth daily.    [provider]  doxycycline (VIBRA-TABS) 100 MG tablet Take 1 tablet (100 mg total) by mouth 2 (two) times daily. 01/04/19   Burnard Hawthorne, FNP  ferrous sulfate 325 (65 FE) MG EC tablet Take 325 mg by mouth daily with breakfast.    [provider]  fluticasone (FLONASE) 50 MCG/ACT nasal spray Place into both nostrils daily.    [provider]  hydrochlorothiazide (HYDRODIURIL) 12.5 MG tablet Take 1 tablet (12.5 mg total) by mouth daily. 02/13/18   Leone Haven, MD  Multiple Vitamin (MULTIVITAMIN) capsule Take 1 capsule by mouth daily.    [provider]  NON FORMULARY vicks cough and congestion dm    [provider]    Allergies Penicillins  Family History  Problem Relation Age of Onset  . Varicose Veins Brother     Social History Social History   Tobacco Use  . Smoking status: Former Smoker    Years: 30.00    Last attempt to quit: 12/12/1993    Years since quitting: 25.3  . Smokeless tobacco: Never Used  Substance Use Topics  . Alcohol use: Yes  Alcohol/week: 7.0 standard drinks    Types: 7 Standard drinks or equivalent per week    Comment: Wine   . Drug use: No    Review of Systems  Constitutional: No fever/chills Eyes: No visual changes. Unable to open left eye without also opening the right. ENT: No sore throat. Cardiovascular: Denies chest pain. Respiratory: Denies shortness of breath. Gastrointestinal: No abdominal pain.  No nausea, no vomiting.  No diarrhea.  No constipation. Genitourinary:  Negative for dysuria. Musculoskeletal: Negative for back pain. Skin: Negative for rash. Neurological: Positive for inability to open left eye without also opening the right. Positive for bilateral hand numbness that resolved. ___________________________________________   PHYSICAL EXAM:  VITAL SIGNS: ED Triage Vitals [04/23/19 1724]  Enc Vitals Group     BP (!) 169/102     Pulse Rate 76     Resp 18     Temp 98.1 F (36.7 C)     Temp Source Oral     SpO2 98 %     Weight 230 lb (104.3 kg)     Height 5\' 5"  (1.651 m)     Head Circumference      Peak Flow      Pain Score 0     Pain Loc      Pain Edu?      Excl. in [place]?     Constitutional: Alert and oriented. Well appearing and in no acute distress. Eyes: Conjunctivae are normal. PERRL. EOMI. Left eyelid palsy independent of the right. Head: Atraumatic. Nose: No congestion/rhinnorhea. Mouth/Throat: Mucous membranes are moist.  Oropharynx non-erythematous. Neck: No stridor.   Cardiovascular: Normal rate, regular rhythm. Grossly normal heart sounds.  Good peripheral circulation. Respiratory: Normal respiratory effort.  No retractions. Lungs CTAB. Gastrointestinal: Soft and nontender. No distention. No abdominal bruits. No CVA tenderness. Musculoskeletal: No lower extremity tenderness nor edema.  No joint effusions. Neurologic:  Normal speech and language. No gross focal neurologic deficits are appreciated. Modified NIH is 0. Equal eyebrow raise. Bilateral eyelids close tightly.  Skin:  Skin is warm, dry and intact. No rash noted. Psychiatric: Mood and affect are normal. Speech and behavior are normal.  ____________________________________________   LABS (all labs ordered are listed, but only abnormal results are displayed)  Labs Reviewed - No data to display ____________________________________________  EKG  Not indicated. ____________________________________________  RADIOLOGY  ED MD interpretation:  MR Non-contrast  Brain Pending.  Official radiology report(s): No results found.  ____________________________________________   PROCEDURES  Procedure(s) performed: None  Procedures  Critical Care performed: No  ____________________________________________   INITIAL IMPRESSION / ASSESSMENT AND PLAN / ED COURSE    78 year old female presents to the emergency department for treatment and evaluation after being advised to do so by her PCP. She had a strange event yesterday about 4:00 pm where she broke out in a sweat, face turned red, and her hands tingled. All symptoms have resolved except residual left eyelid palsy independent of the right. She has had no confusion, headache, weakness, or other symptoms of concern.   Differential diagnosis includes, but is not limited to TIA, CVA, variant of Bell's Palsy.   ----------------------------------------- 8:32 PM on 04/23/2019 -----------------------------------------  CT head is normal. Dr. Quentin Cornwall will follow patient to disposition. MR brain is pending.      ____________________________________________   FINAL CLINICAL IMPRESSION(S) / ED DIAGNOSES  Final diagnoses:  None     ED Discharge Orders    None       Note:  This document was prepared using Dragon voice recognition software and may include unintentional dictation errors.    Victorino Dike, FNP 04/23/19 2032    Merlyn Lot, MD 04/23/19 2204

## 2019-04-23 NOTE — ED Triage Notes (Signed)
Pt states she was sitting at home yesterday and suddenly got hot and sweaty and tingling all over lasting about 15 minutes. Today noticed she can not open her left eye without opening both. No noted facial droop or weakness, denies any numbness or tingling. Pt is a/ox4 with no noted change in speech.

## 2019-04-23 NOTE — Telephone Encounter (Signed)
Noted.  Agree with urgent care evaluation.  Please follow-up with her to see if she was evaluated.

## 2019-04-23 NOTE — Telephone Encounter (Signed)
Patient says yesterday she had numbness and her extremities felt weak, she felt like she was not her self advised patient she should have been evaluated yesterday , patient said she feels she has some residual today . Advised she needs to be evaluated she said her daughter is driving her to UC .

## 2019-04-24 NOTE — Telephone Encounter (Signed)
Noted we should get her set up for follow-up later this week or early next week for reevaluation.  Thanks.

## 2019-04-25 NOTE — Telephone Encounter (Signed)
Pt scheduled a doxy for Wed 5/20.

## 2019-05-01 ENCOUNTER — Telehealth: Payer: Self-pay | Admitting: Family Medicine

## 2019-05-01 ENCOUNTER — Ambulatory Visit (INDEPENDENT_AMBULATORY_CARE_PROVIDER_SITE_OTHER): Payer: Medicare Other | Admitting: Family Medicine

## 2019-05-01 ENCOUNTER — Encounter: Payer: Self-pay | Admitting: Family Medicine

## 2019-05-01 ENCOUNTER — Other Ambulatory Visit: Payer: Self-pay

## 2019-05-01 DIAGNOSIS — R232 Flushing: Secondary | ICD-10-CM | POA: Diagnosis not present

## 2019-05-01 DIAGNOSIS — I10 Essential (primary) hypertension: Secondary | ICD-10-CM | POA: Diagnosis not present

## 2019-05-01 MED ORDER — HYDROCHLOROTHIAZIDE 25 MG PO TABS: 25.0000 mg | ORAL_TABLET | Freq: Every day | ORAL | 1 refills | Status: DC

## 2019-05-01 NOTE — Assessment & Plan Note (Signed)
Diastolic blood pressure has trended up and is intermittently uncontrolled currently.  We will increase her HCTZ.  She will have lab work in 1 month.  Nurse BP check at that time.  Follow-up with me in 4 months.

## 2019-05-01 NOTE — Progress Notes (Signed)
Virtual Visit via telephone Note  This visit type was conducted due to national recommendations for restrictions regarding the COVID-19 pandemic (e.g. social distancing).  This format is felt to be most appropriate for this patient at this time.  All issues noted in this document were discussed and addressed.  No physical exam was performed (except for noted visual exam findings with Video Visits).   I connected with Ann Spencer today at  4:00 PM EDT by telephone and verified that I am speaking with the correct person using two identifiers. Location patient: home Location provider: work Persons participating in the virtual visit: patient, provider  I discussed the limitations, risks, security and privacy concerns of performing an evaluation and management service by telephone and the availability of in person appointments. I also discussed with the patient that there may be a patient responsible charge related to this service. The patient expressed understanding and agreed to proceed.  Interactive audio and video telecommunications were attempted between this provider and patient, however failed, due to patient having technical difficulties OR patient did not have access to video capability.  We continued and completed visit with audio only.   Reason for visit: Follow-up.  HPI: Flushing: Patient notes she was evaluated in the emergency room for this.  She was sitting there in her kitchen when she suddenly felt hot and flushed.  Her head started to tingling in her hands and legs started to tingle.  Her vision became a little spotty and she felt overall weak and hot.  She noted this lasted for 15 minutes and then she was okay with no recurrent symptoms.  She had no headache or fevers.  No nausea.  She went to the walk-in clinic to be evaluated and while waiting on the physician she tried to open her left eye and was unable to do so unless her right eye was open.  This prompted evaluation in the  emergency department and she underwent imaging and lab work with no obvious cause for her symptoms.  MRI was unremarkable for an acute cause.  She was noted to have chronic small vessel ischemic changes.  CT head with similar findings.  She notes her left eye symptoms have resolved at this point.  She notes the ED physician felt that maybe she had an allergic reaction to something though a cause was not identified.  She notes no new foods or medications.  Hypertension: Has been running more elevated on the diastolic side with diastolic blood pressures in the upper 80s and low 90s.  She notes no chest pain or shortness of breath.  She is taking HCTZ.   ROS: See pertinent positives and negatives per HPI.  Past Medical History:  Diagnosis Date   Allergy    History of head injury 11/17/2015   Hypertension    Phlebitis 2014   Pneumonia due to infectious organism 02/15/2018   Rotator cuff injury 12/2013    Past Surgical History:  Procedure Laterality Date   ABDOMINAL HYSTERECTOMY  1981   Full   SHOULDER SURGERY Left 03/03/15   vein closure Bilateral Feb 2008    Family History  Problem Relation Age of Onset   Varicose Veins Brother     SOCIAL HX: Former smoker   Current Outpatient Medications:    aspirin 81 MG tablet, Take 81 mg by mouth daily., Disp: , Rfl:    Calcium Carbonate (CALTRATE 600 PO), Take 1 tablet by mouth daily., Disp: , Rfl:    Cholecalciferol (D3  ADULT PO), Take 1 tablet by mouth daily., Disp: , Rfl:    doxycycline (VIBRA-TABS) 100 MG tablet, Take 1 tablet (100 mg total) by mouth 2 (two) times daily., Disp: 10 tablet, Rfl: 0   ferrous sulfate 325 (65 FE) MG EC tablet, Take 325 mg by mouth daily with breakfast., Disp: , Rfl:    fluticasone (FLONASE) 50 MCG/ACT nasal spray, Place into both nostrils daily., Disp: , Rfl:    hydrochlorothiazide (HYDRODIURIL) 25 MG tablet, Take 1 tablet (25 mg total) by mouth daily., Disp: 90 tablet, Rfl: 1   Multiple Vitamin  (MULTIVITAMIN) capsule, Take 1 capsule by mouth daily., Disp: , Rfl:    NON FORMULARY, vicks cough and congestion dm, Disp: , Rfl:   Current Facility-Administered Medications:    lidocaine (PF) (XYLOCAINE) 1 % injection 10 mL, 10 mL, Other, Once, Milinda Pointer, MD  EXAM: This was a telehealth telephone visit and thus no physical exam was completed.  ASSESSMENT AND PLAN:  Discussed the following assessment and plan:  Flushing  Essential hypertension - Plan: hydrochlorothiazide (HYDRODIURIL) 25 MG tablet, Basic Metabolic Panel (BMET)  Flushing Undetermined cause.  Possible allergic reaction to some unknown exposure.  Negative evaluation for neurological cause in the ED.  Symptoms have resolved and have not recurred.  At this time she will monitor and if she has any recurrence she will let us know immediately.  Essential hypertension Diastolic blood pressure has trended up and is intermittently uncontrolled currently.  We will increase her HCTZ.  She will have lab work in 1 month.  Nurse BP check at that time.  Follow-up with me in 4 months.  CMA will contact the patient to get her scheduled for lab work and a nurse BP check in 1 month.  Follow-up with me in 4 months.   I discussed the assessment and treatment plan with the patient. The patient was provided an opportunity to ask questions and all were answered. The patient agreed with the plan and demonstrated an understanding of the instructions.   The patient was advised to call back or seek an in-person evaluation if the symptoms worsen or if the condition fails to improve as anticipated.  I provided 22 minutes of non-face-to-face time during this encounter.   Tommi Rumps, MD

## 2019-05-01 NOTE — Telephone Encounter (Signed)
Please contact the patient and get her set up for lab work and a nurse BP check in 1 month.  Please get her scheduled for follow-up with me in 4 months.

## 2019-05-01 NOTE — Assessment & Plan Note (Signed)
Undetermined cause.  Possible allergic reaction to some unknown exposure.  Negative evaluation for neurological cause in the ED.  Symptoms have resolved and have not recurred.  At this time she will monitor and if she has any recurrence she will let us know immediately.

## 2019-05-02 NOTE — Telephone Encounter (Signed)
Called pt and left a VM to call back. CRM created and sent to [facility] pool. 

## 2019-05-03 NOTE — Telephone Encounter (Signed)
Called and spoke with pt. Pt has been scheduled for an appt.

## 2019-06-03 ENCOUNTER — Other Ambulatory Visit: Payer: Medicare Other

## 2019-06-04 ENCOUNTER — Other Ambulatory Visit: Payer: Medicare Other

## 2019-06-04 ENCOUNTER — Ambulatory Visit: Payer: Medicare Other

## 2019-06-04 ENCOUNTER — Telehealth: Payer: Self-pay

## 2019-06-04 DIAGNOSIS — I872 Venous insufficiency (chronic) (peripheral): Secondary | ICD-10-CM

## 2019-06-04 NOTE — Telephone Encounter (Signed)
Copied from [place] [phone]. Topic: Appointment Scheduling - Scheduling Inquiry for Clinic >> Jun 03, 2019  4:40 PM Nils Flack wrote: Reason for CRM: pt would like to make an appt for her right leg, she has a sore on it.  She will be there for appt at 330 tues for nurse visit

## 2019-06-04 NOTE — Telephone Encounter (Signed)
Copied from [place] [phone]. Topic: Appointment Scheduling - Scheduling Inquiry for Clinic >> Jun 03, 2019  4:40 PM Nils Flack wrote: Reason for CRM: pt would like to make an appt for her right leg, she has a sore on it.  She will be there for appt at 330 tues for nurse visit

## 2019-06-04 NOTE — Telephone Encounter (Signed)
She can be rescheduled for the nurse visit.  Please follow-up with her later today or early tomorrow to find out if she went to the walk-in clinic to be evaluated.

## 2019-06-04 NOTE — Telephone Encounter (Signed)
Copied from [place] [phone]. Topic: Appointment Scheduling - Scheduling Inquiry for Clinic >> Jun 03, 2019  4:40 PM Nils Flack wrote: Reason for CRM: pt would like to make an appt for her right leg, she has a sore on it.  She will be there for appt at 330 tues for nurse visit

## 2019-06-04 NOTE — Telephone Encounter (Signed)
Noted. Thanks for letting me know.

## 2019-06-04 NOTE — Telephone Encounter (Signed)
Called and spoke to patient.  Patient said that both of her legs are swollen and warm to touch.  Patient said that both feet are also swollen.  Patient said that she has blisters that are oozing on the front of her right leg.  Patient said that she has been having an ongoing issue with leg swelling but this is the first time that she has blisters.  Patient says that she usually wears compression stockings but have not been wearing them for awhile.  Patient denied having a fever, no shortness of breath, no chest pain or tightness, no vision changes.   Patient was asked COVID screening questions-no to all questions.  Patient said that she did have a little dizziness, and a little tingling in her left arm and felt hot and clamy on Sunday afternoon but denies having any of those symptoms today.  Patient said that she also had a similar "spell" when she went to the ED about a month ago for tingling in arm and seeing spots (vision changes).  Patient said that the episode on Sunday wasn't as bad as a month ago.  Patient requested to be seen in the office today for legs along with her scheduled nurse visit for a bp check.  Informed pt that there are no appts available in the office today. Patient said that she has an appointment with Dr. Prescott Parma, an orthopedic doctor who usually gives her knee injections at the [facility] on tomorrow afternoon and said that she could wait to see him then.  Recommended patient go to [facility] to be evaluated today and not wait until tomorrow given symptoms.  Pt said that she cares for her great granddaughter and would need to make arrangements for child care but agreed to go to UC today.  Patient's nurse visit scheduled for this afternoon was cancelled.  Pt requested a callback to reschedule.  Notes forwarded to PCP for review.

## 2019-06-05 NOTE — Telephone Encounter (Signed)
Called and spoke to patient.  Patient said that she went to [facility] yesterday.  Patient said that she is being treated for statis dermatitis.  Patient said that she was given an ointment (she couldn't remember the name) to apply to her legs for 10 days, advised to wear compression stockings, and has a compression machine for legs.  Patient said that she was told to follow up with her primary care doctor and a vein doctor.  Patient needs referral to a vascular surgeon and would like to see someone at [facility] or in the [facility] area.  Patient was scheduled a follow up appointment with PCP for f/u and bp check on 06/12/19 @ 10:30 am.

## 2019-06-06 NOTE — Telephone Encounter (Signed)
I have placed the referral to the vascular surgeon.  They are part of the [facility] network.  She can be seen in the office for her follow-up as long as she can pass a screening for COVID-19.

## 2019-06-10 ENCOUNTER — Other Ambulatory Visit: Payer: Self-pay

## 2019-06-12 ENCOUNTER — Other Ambulatory Visit: Payer: Self-pay

## 2019-06-12 ENCOUNTER — Ambulatory Visit (INDEPENDENT_AMBULATORY_CARE_PROVIDER_SITE_OTHER): Payer: Medicare Other | Admitting: Family Medicine

## 2019-06-12 ENCOUNTER — Encounter: Payer: Self-pay | Admitting: Family Medicine

## 2019-06-12 DIAGNOSIS — I872 Venous insufficiency (chronic) (peripheral): Secondary | ICD-10-CM | POA: Diagnosis not present

## 2019-06-12 DIAGNOSIS — K219 Gastro-esophageal reflux disease without esophagitis: Secondary | ICD-10-CM | POA: Diagnosis not present

## 2019-06-12 DIAGNOSIS — I10 Essential (primary) hypertension: Secondary | ICD-10-CM

## 2019-06-12 LAB — BASIC METABOLIC PANEL
BUN: 21 mg/dL (ref 6–23)
CO2: 33 mEq/L — ABNORMAL HIGH (ref 19–32)
Calcium: 9.4 mg/dL (ref 8.4–10.5)
Chloride: 98 mEq/L (ref 96–112)
Creatinine, Ser: 0.9 mg/dL (ref 0.40–1.20)
GFR: 60.75 mL/min (ref >60.00)
Glucose, Bld: 93 mg/dL (ref 70–99)
Potassium: 4.5 mEq/L (ref 3.5–5.1)
Sodium: 138 mEq/L (ref 135–145)

## 2019-06-12 NOTE — Assessment & Plan Note (Signed)
Discussed consistent use of compression stockings and compression machine.  Advised to monitor and if she has recurrent symptoms letting us know.  She will see vein and vascular surgery as planned.

## 2019-06-12 NOTE — Assessment & Plan Note (Signed)
Much improved.  She will continue with HCTZ.  BMP today.

## 2019-06-12 NOTE — Patient Instructions (Signed)
Nice to see you. We will get labs today.  Please continue with the compression stockings and compression device.  Please see vascular surgery as planned.

## 2019-06-12 NOTE — Assessment & Plan Note (Signed)
Rare symptoms.  She will continue as needed omeprazole.

## 2019-06-12 NOTE — Progress Notes (Signed)
  Tommi Rumps, MD Phone: [phone]  Ann Spencer is a 78 y.o. female who presents today for follow-up.  Venous insufficiency/stasis dermatitis: Patient was evaluated at the walk-in clinic.  She was diagnosed with stasis dermatitis and treated with Bactroban.  She notes this has improved significantly since using her compression stockings and compression machine.  She notes the blisters that she had have been healing as well.  She is using compression stockings and compression machine.  She does have an appointment with vein and vascular later this month.  Hypertension: Has been well controlled and similar to what we got today.  She is taking HCTZ.  No chest pain or shortness of breath.  GERD: She rarely has symptoms.  She will take omeprazole if she does have them and that resolves then.  No abdominal pain, dysphagia, or blood in her stool.  Social History   Tobacco Use  Smoking Status Former Smoker  . Years: 30.00  . Quit date: 12/12/1993  . Years since quitting: 25.5  Smokeless Tobacco Never Used     ROS see history of present illness  Objective  Physical Exam Vitals:   06/12/19 1041  BP: 130/60  Pulse: 98  Temp: 98.1 F (36.7 C)  SpO2: 98%    BP Readings from Last 3 Encounters:  06/12/19 130/60  04/23/19 (!) 145/76  03/08/19 (!) 166/91   Wt Readings from Last 3 Encounters:  06/12/19 235 lb 9.6 oz (106.9 kg)  04/23/19 230 lb (104.3 kg)  01/08/19 233 lb 12.8 oz (106.1 kg)    Physical Exam Constitutional:      General: She is not in acute distress.    Appearance: She is not diaphoretic.  Cardiovascular:     Rate and Rhythm: Normal rate and regular rhythm.     Heart sounds: Normal heart sounds.  Pulmonary:     Effort: Pulmonary effort is normal.     Breath sounds: Normal breath sounds.  Musculoskeletal:       Legs:     Comments: Compression stockings in place, no pitting edema with compression stockings on  Skin:    General: Skin is warm and dry.   Neurological:     Mental Status: She is alert.      Assessment/Plan: Please see individual problem list.  Venous insufficiency Discussed consistent use of compression stockings and compression machine.  Advised to monitor and if she has recurrent symptoms letting us know.  She will see vein and vascular surgery as planned.  Essential hypertension Much improved.  She will continue with HCTZ.  BMP today.  GERD (gastroesophageal reflux disease) Rare symptoms.  She will continue as needed omeprazole.   No orders of the defined types were placed in this encounter.   No orders of the defined types were placed in this encounter.    Tommi Rumps, MD [place]

## 2019-06-27 ENCOUNTER — Encounter (INDEPENDENT_AMBULATORY_CARE_PROVIDER_SITE_OTHER): Payer: Medicare Other | Admitting: Vascular Surgery

## 2019-07-01 ENCOUNTER — Encounter (INDEPENDENT_AMBULATORY_CARE_PROVIDER_SITE_OTHER): Payer: Medicare Other | Admitting: Vascular Surgery

## 2019-07-10 ENCOUNTER — Ambulatory Visit: Payer: Medicare Other | Admitting: Family Medicine

## 2019-08-29 ENCOUNTER — Other Ambulatory Visit: Payer: Self-pay

## 2019-08-29 ENCOUNTER — Encounter (INDEPENDENT_AMBULATORY_CARE_PROVIDER_SITE_OTHER): Payer: Self-pay | Admitting: Vascular Surgery

## 2019-08-29 ENCOUNTER — Ambulatory Visit (INDEPENDENT_AMBULATORY_CARE_PROVIDER_SITE_OTHER): Payer: Medicare Other | Admitting: Vascular Surgery

## 2019-08-29 VITALS — BP 171/84 | HR 89 | Resp 12 | Ht 65.0 in | Wt 239.0 lb

## 2019-08-29 DIAGNOSIS — K219 Gastro-esophageal reflux disease without esophagitis: Secondary | ICD-10-CM | POA: Diagnosis not present

## 2019-08-29 DIAGNOSIS — I890 Lymphedema, not elsewhere classified: Secondary | ICD-10-CM | POA: Diagnosis not present

## 2019-08-29 DIAGNOSIS — I10 Essential (primary) hypertension: Secondary | ICD-10-CM

## 2019-08-29 DIAGNOSIS — I872 Venous insufficiency (chronic) (peripheral): Secondary | ICD-10-CM | POA: Diagnosis not present

## 2019-08-29 DIAGNOSIS — M25619 Stiffness of unspecified shoulder, not elsewhere classified: Secondary | ICD-10-CM | POA: Insufficient documentation

## 2019-08-29 DIAGNOSIS — M5030 Other cervical disc degeneration, unspecified cervical region: Secondary | ICD-10-CM | POA: Insufficient documentation

## 2019-08-29 DIAGNOSIS — M25519 Pain in unspecified shoulder: Secondary | ICD-10-CM | POA: Insufficient documentation

## 2019-08-29 DIAGNOSIS — E785 Hyperlipidemia, unspecified: Secondary | ICD-10-CM | POA: Diagnosis not present

## 2019-08-29 NOTE — Progress Notes (Signed)
MRN : [id number]  Ann Spencer is a 78 y.o. ([birth year]) female who presents with chief complaint of  Chief Complaint  Patient presents with  . New Patient (Initial Visit)  .  History of Present Illness:   The patient returns to the office for followup evaluation regarding leg swelling.  The swelling has persisted but with the lymph pump is much, much better controlled. The pain associated with swelling is essentially eliminated. There have not been any interval development of a ulcerations or wounds.  The patient denies problems with the pump, noting it is working well and the leggings are in good condition.  Since the previous visit the patient has been wearing graduated compression stockings and using the lymph pump on a routine basis and  has noted significant improvement in the lymphedema.   Patient stated the lymph pump has been a very positive factor in her care.    Current Meds  Medication Sig  . aspirin 81 MG tablet Take 81 mg by mouth daily.  . Calcium Carbonate (CALTRATE 600 PO) Take 1 tablet by mouth daily.  . Cholecalciferol (D3 ADULT PO) Take 1 tablet by mouth daily.  . ferrous sulfate 325 (65 FE) MG EC tablet Take 325 mg by mouth daily with breakfast.  . hydrochlorothiazide (HYDRODIURIL) 25 MG tablet Take 1 tablet (25 mg total) by mouth daily.  . Multiple Vitamin (MULTIVITAMIN) capsule Take 1 capsule by mouth daily.   Current Facility-Administered Medications for the 08/29/19 encounter (Office Visit) with Delana Meyer, Dolores Lory, MD  Medication  . lidocaine (PF) (XYLOCAINE) 1 % injection 10 mL    Past Medical History:  Diagnosis Date  . Allergy   . History of head injury 11/17/2015  . Hypertension   . Phlebitis 2014  . Pneumonia due to infectious organism 02/15/2018  . Rotator cuff injury 12/2013    Past Surgical History:  Procedure Laterality Date  . ABDOMINAL HYSTERECTOMY  1981   Full  . SHOULDER SURGERY Left 03/03/15  . vein closure Bilateral Feb 2008     Social History Social History   Tobacco Use  . Smoking status: Former Smoker    Years: 30.00    Quit date: 12/12/1993    Years since quitting: 25.7  . Smokeless tobacco: Never Used  Substance Use Topics  . Alcohol use: Yes    Alcohol/week: 7.0 standard drinks    Types: 7 Standard drinks or equivalent per week    Comment: Wine   . Drug use: No    Family History Family History  Problem Relation Age of Onset  . Varicose Veins Brother   No family history of bleeding/clotting disorders, porphyria or autoimmune disease   Allergies  Allergen Reactions  . Penicillins      REVIEW OF SYSTEMS (Negative unless checked)  Constitutional: [] Weight loss  [] Fever  [] Chills Cardiac: [] Chest pain   [] Chest pressure   [] Palpitations   [] Shortness of breath when laying flat   [] Shortness of breath with exertion. Vascular:  [] Pain in legs with walking   [] Pain in legs at rest  [] History of DVT   [] Phlebitis   [x] Swelling in legs   [x] Varicose veins   [] Non-healing ulcers Pulmonary:   [] Uses home oxygen   [] Productive cough   [] Hemoptysis   [] Wheeze  [] COPD   [] Asthma Neurologic:  [] Dizziness   [] Seizures   [] History of stroke   [] History of TIA  [] Aphasia   [] Vissual changes   [] Weakness or numbness in arm   [] Weakness  or numbness in leg Musculoskeletal:   [] Joint swelling   [] Joint pain   [] Low back pain Hematologic:  [] Easy bruising  [] Easy bleeding   [] Hypercoagulable state   [] Anemic Gastrointestinal:  [] Diarrhea   [] Vomiting  [] Gastroesophageal reflux/heartburn   [] Difficulty swallowing. Genitourinary:  [] Chronic kidney disease   [] Difficult urination  [] Frequent urination   [] Blood in urine Skin:  [] Rashes   [] Ulcers  Psychological:  [] History of anxiety   []  History of major depression.  Physical Examination  Vitals:   08/29/19 1526  BP: (!) 171/84  Pulse: 89  Resp: 12  Weight: 239 lb (108.4 kg)  Height: 5\' 5"  (1.651 m)   Body mass index is 39.77 kg/m. Gen: WD/WN, NAD Head:  [place]/AT, No temporalis wasting.  Ear/Nose/Throat: Hearing grossly intact, nares w/o erythema or drainage, poor dentition Eyes: PER, EOMI, sclera nonicteric.  Neck: Supple, no masses.  No bruit or JVD.  Pulmonary:  Good air movement, clear to auscultation bilaterally, no use of accessory muscles.  Cardiac: RRR, normal S1, S2, no Murmurs. Vascular: scattered varicosities present bilaterally.  Severe venous stasis changes to the legs bilaterally right > left.  3+ soft pitting edema Vessel Right Left  PT Palpable Palpable  DP Palpable Palpable  Gastrointestinal: soft, non-distended. No guarding/no peritoneal signs.  Musculoskeletal: M/S 5/5 throughout.  No deformity or atrophy.  Neurologic: CN 2-12 intact. Pain and light touch intact in extremities.  Symmetrical.  Speech is fluent. Motor exam as listed above. Psychiatric: Judgment intact, Mood & affect appropriate for pt's clinical situation. Dermatologic: Severe venous rashes no ulcers noted.  No changes consistent with cellulitis. Lymph : No Cervical lymphadenopathy, no lichenification or skin changes of chronic lymphedema.  CBC Lab Results  Component Value Date   WBC 4.2 04/23/2019   HGB 14.9 04/23/2019   HCT 44.2 04/23/2019   MCV 97.8 04/23/2019   PLT 172 04/23/2019    BMET    Component Value Date/Time   NA 138 06/12/2019 1057   NA 138 02/14/2014 1329   K 4.5 06/12/2019 1057   K 4.2 02/14/2014 1329   CL 98 06/12/2019 1057   CL 104 02/14/2014 1329   CO2 33 (H) 06/12/2019 1057   CO2 26 02/14/2014 1329   GLUCOSE 93 06/12/2019 1057   GLUCOSE 105 (H) 02/14/2014 1329   BUN 21 06/12/2019 1057   BUN 13 02/14/2014 1329   CREATININE 0.90 06/12/2019 1057   CREATININE 1.00 03/05/2015 0145   CALCIUM 9.4 06/12/2019 1057   CALCIUM 9.4 02/14/2014 1329   GFRNONAA >60 04/23/2019 1908   GFRNONAA 56 (L) 03/05/2015 0145   GFRAA >60 04/23/2019 1908   GFRAA >60 03/05/2015 0145   CrCl cannot be calculated (Patient's most recent lab result is  older than the maximum 21 days allowed.).  COAG No results found for: INR, PROTIME  Radiology No results found.   Assessment/Plan 1. Venous insufficiency  No surgery or intervention at this point in time.    I have reviewed my discussion with the patient regarding lymphedema and why it  causes symptoms.  Patient will continue wearing graduated compression stockings class 1 (20-30 mmHg) on a daily basis a prescription was given. The patient is reminded to put the stockings on first thing in the morning and removing them in the evening. The patient is instructed specifically not to sleep in the stockings.   In addition, behavioral modification throughout the day will be continued.  This will include frequent elevation (such as in a recliner), use of  over the counter pain medications as needed and exercise such as walking.  I have reviewed systemic causes for chronic edema such as liver, kidney and cardiac etiologies and there does not appear to be any significant changes in these organ systems over the past year.  The patient is under the impression that these organ systems are all stable and unchanged.    The patient will continue aggressive use of the  lymph pump.  This will continue to improve the edema control and prevent sequela such as ulcers and infections.   The patient will follow-up with me on an annual basis.    2. Lymphedema  No surgery or intervention at this point in time.    I have reviewed my discussion with the patient regarding lymphedema and why it  causes symptoms.  Patient will continue wearing graduated compression stockings class 1 (20-30 mmHg) on a daily basis a prescription was given. The patient is reminded to put the stockings on first thing in the morning and removing them in the evening. The patient is instructed specifically not to sleep in the stockings.   In addition, behavioral modification throughout the day will be continued.  This will include frequent  elevation (such as in a recliner), use of over the counter pain medications as needed and exercise such as walking.  I have reviewed systemic causes for chronic edema such as liver, kidney and cardiac etiologies and there does not appear to be any significant changes in these organ systems over the past year.  The patient is under the impression that these organ systems are all stable and unchanged.    The patient will continue aggressive use of the  lymph pump.  This will continue to improve the edema control and prevent sequela such as ulcers and infections.   The patient will follow-up with me on an annual basis.    3. Essential hypertension Continue antihypertensive medications as already ordered, these medications have been reviewed and there are no changes at this time.   4. Gastroesophageal reflux disease, esophagitis presence not specified Continue PPI as already ordered, this medication has been reviewed and there are no changes at this time.  Avoidence of caffeine and alcohol  Moderate elevation of the head of the bed   5. Hyperlipidemia, unspecified hyperlipidemia type Continue statin as ordered and reviewed, no changes at this time     Hortencia Pilar, MD  08/29/2019 3:57 PM

## 2019-09-20 DIAGNOSIS — M1712 Unilateral primary osteoarthritis, left knee: Secondary | ICD-10-CM | POA: Diagnosis not present

## 2019-10-03 ENCOUNTER — Ambulatory Visit (INDEPENDENT_AMBULATORY_CARE_PROVIDER_SITE_OTHER): Payer: Medicare Other

## 2019-10-03 ENCOUNTER — Other Ambulatory Visit: Payer: Self-pay

## 2019-10-03 DIAGNOSIS — Z23 Encounter for immunization: Secondary | ICD-10-CM | POA: Diagnosis not present

## 2019-10-04 DIAGNOSIS — M1712 Unilateral primary osteoarthritis, left knee: Secondary | ICD-10-CM | POA: Diagnosis not present

## 2019-10-07 ENCOUNTER — Telehealth (INDEPENDENT_AMBULATORY_CARE_PROVIDER_SITE_OTHER): Payer: Self-pay | Admitting: Vascular Surgery

## 2019-10-07 ENCOUNTER — Telehealth: Payer: Self-pay

## 2019-10-07 NOTE — Telephone Encounter (Signed)
Pt stated she is having full knee replacement on 11/05/19. Dr. Mack Guise is faxing over paperwork for Dr. Caryl Bis to fill out. She wants to know if she needs to see Dr. Caryl Bis first or if her will fill out papers when they come and send them back. Please advise.  Nina,cma

## 2019-10-07 NOTE — Telephone Encounter (Signed)
Patient has been made aware that Dr Delana Meyer has receive clearance form by [facility] and will return form when completed

## 2019-10-07 NOTE — Telephone Encounter (Signed)
She has follow-up with me on 10/16/19. We can complete her pre-op visit at that time.

## 2019-10-07 NOTE — Telephone Encounter (Signed)
Copied from [place] [id number]. Topic: General - Inquiry >> Oct 07, 2019 10:01 AM Ann Spencer wrote: Reason for CRM: Pt stated she is having full knee replacement on 11/05/19. Dr. Mack Guise is faxing over paperwork for Dr. Caryl Bis to fill out. She wants to know if she needs to see Dr. Caryl Bis first or if her will fill out papers when they come and send them back. Please advise.

## 2019-10-08 NOTE — Telephone Encounter (Signed)
I called and informed the patient that we did receive her preop form for her knee replacement and the provider will fill out at her upcoming appointment with him on nov. 4, pt understood. Johnanthony Wilden,cma

## 2019-10-11 ENCOUNTER — Other Ambulatory Visit: Payer: Self-pay | Admitting: Orthopedic Surgery

## 2019-10-15 ENCOUNTER — Telehealth: Payer: Self-pay | Admitting: Family Medicine

## 2019-10-15 ENCOUNTER — Other Ambulatory Visit: Payer: Self-pay

## 2019-10-15 ENCOUNTER — Telehealth (INDEPENDENT_AMBULATORY_CARE_PROVIDER_SITE_OTHER): Payer: Self-pay | Admitting: Vascular Surgery

## 2019-10-15 NOTE — Telephone Encounter (Signed)
We can bring her in for ABIs.Ann Spencer or Ann Spencer

## 2019-10-15 NOTE — Telephone Encounter (Signed)
Pt states she is having left knee replacement on 11/24 and a letter should have been sent out. Pt is coming in for a surgical clearance on 11/04. Please advise? Thank you!

## 2019-10-16 ENCOUNTER — Encounter: Payer: Self-pay | Admitting: Family Medicine

## 2019-10-16 ENCOUNTER — Ambulatory Visit (INDEPENDENT_AMBULATORY_CARE_PROVIDER_SITE_OTHER): Payer: Medicare Other | Admitting: Family Medicine

## 2019-10-16 ENCOUNTER — Other Ambulatory Visit (INDEPENDENT_AMBULATORY_CARE_PROVIDER_SITE_OTHER): Payer: Self-pay | Admitting: Nurse Practitioner

## 2019-10-16 DIAGNOSIS — Z01818 Encounter for other preprocedural examination: Secondary | ICD-10-CM

## 2019-10-16 DIAGNOSIS — Z01810 Encounter for preprocedural cardiovascular examination: Secondary | ICD-10-CM

## 2019-10-16 NOTE — Patient Instructions (Addendum)
Nice to see you. We will get your surgical clearance form completed.

## 2019-10-16 NOTE — Telephone Encounter (Signed)
Patient was seen today for her medical clearance.  Ann Spencer,cma

## 2019-10-16 NOTE — Progress Notes (Signed)
  Tommi Rumps, MD Phone: [phone]  Ann Spencer is a 78 y.o. female who presents today for follow-up.  Surgical clearance: Patient presents for surgical clearance for left total knee replacement.  She notes no chest pain with exertion.  She does have some breathlessness that is stable for many years related to her weight and being out of shape.  This has not progressed or worsened.  She has not been able to do much activity related to her knee pain.  No kidney disease.  No heart attack history or irregular heartbeat history.  No history of stroke or seizures.  No history with anesthesia.  No history of thyroid disease, angina, liver disease, heart failure, asthma, diabetes, or bronchitis.  She has preop scheduled for 11/16 with lab work and an EKG ordered.  She is seeing her vascular surgeon tomorrow for clearance from them as well.  Social History   Tobacco Use  Smoking Status Former Smoker  . Years: 30.00  . Quit date: 12/12/1993  . Years since quitting: 25.8  Smokeless Tobacco Never Used     ROS see history of present illness  Objective  Physical Exam Vitals:   10/16/19 1023  BP: 120/80  Pulse: 92  Temp: 98 F (36.7 C)  SpO2: 96%    BP Readings from Last 3 Encounters:  10/16/19 120/80  08/29/19 (!) 171/84  06/12/19 130/60   Wt Readings from Last 3 Encounters:  10/16/19 240 lb 9.6 oz (109.1 kg)  08/29/19 239 lb (108.4 kg)  06/12/19 235 lb 9.6 oz (106.9 kg)    Physical Exam Constitutional:      General: She is not in acute distress.    Appearance: She is not diaphoretic.  Eyes:     Conjunctiva/sclera: Conjunctivae normal.     Pupils: Pupils are equal, round, and reactive to light.  Cardiovascular:     Rate and Rhythm: Normal rate and regular rhythm.     Heart sounds: Normal heart sounds.  Pulmonary:     Effort: Pulmonary effort is normal.     Breath sounds: Normal breath sounds.  Abdominal:     General: Bowel sounds are normal. There is no  distension.     Palpations: Abdomen is soft.     Tenderness: There is no abdominal tenderness. There is no guarding or rebound.  Musculoskeletal:     Right lower leg: No edema.     Left lower leg: No edema.  Skin:    General: Skin is warm and dry.  Neurological:     Mental Status: She is alert.      Assessment/Plan: Please see individual problem list.  Preop examination Preoperative examination completed.  She has lab work and an EKG scheduled through preop at the hospital.  She is low risk from a cardiovascular perspective with a Lyndel Safe perioperative risk score of 0.2%.  She will complete her evaluation with her vascular surgeon.  We will get her preoperative clearance form sent in.  I did discuss that her BMI was just above there cut off.  It will be up to the surgeon to determine if they are willing to proceed if her BMI remains above 40.    No orders of the defined types were placed in this encounter.   No orders of the defined types were placed in this encounter.    Tommi Rumps, MD [place]

## 2019-10-16 NOTE — Assessment & Plan Note (Signed)
Preoperative examination completed.  She has lab work and an EKG scheduled through preop at the hospital.  She is low risk from a cardiovascular perspective with a Lyndel Safe perioperative risk score of 0.2%.  She will complete her evaluation with her vascular surgeon.  We will get her preoperative clearance form sent in.  I did discuss that her BMI was just above there cut off.  It will be up to the surgeon to determine if they are willing to proceed if her BMI remains above 40.

## 2019-10-17 ENCOUNTER — Ambulatory Visit (INDEPENDENT_AMBULATORY_CARE_PROVIDER_SITE_OTHER): Payer: Medicare Other

## 2019-10-17 ENCOUNTER — Encounter (INDEPENDENT_AMBULATORY_CARE_PROVIDER_SITE_OTHER): Payer: Self-pay | Admitting: Nurse Practitioner

## 2019-10-17 ENCOUNTER — Other Ambulatory Visit: Payer: Self-pay

## 2019-10-17 ENCOUNTER — Ambulatory Visit (INDEPENDENT_AMBULATORY_CARE_PROVIDER_SITE_OTHER): Payer: Medicare Other | Admitting: Nurse Practitioner

## 2019-10-17 VITALS — BP 134/77 | HR 84 | Resp 16 | Wt 239.0 lb

## 2019-10-17 DIAGNOSIS — Z01810 Encounter for preprocedural cardiovascular examination: Secondary | ICD-10-CM

## 2019-10-17 DIAGNOSIS — I70209 Unspecified atherosclerosis of native arteries of extremities, unspecified extremity: Secondary | ICD-10-CM

## 2019-10-17 DIAGNOSIS — K219 Gastro-esophageal reflux disease without esophagitis: Secondary | ICD-10-CM | POA: Diagnosis not present

## 2019-10-17 DIAGNOSIS — I890 Lymphedema, not elsewhere classified: Secondary | ICD-10-CM | POA: Diagnosis not present

## 2019-10-17 DIAGNOSIS — E785 Hyperlipidemia, unspecified: Secondary | ICD-10-CM | POA: Diagnosis not present

## 2019-10-20 ENCOUNTER — Encounter (INDEPENDENT_AMBULATORY_CARE_PROVIDER_SITE_OTHER): Payer: Self-pay | Admitting: Nurse Practitioner

## 2019-10-20 NOTE — Progress Notes (Signed)
SUBJECTIVE:  Patient ID: Ann Spencer, female    DOB: 04/27/1941, 78 y.o.   MRN: [id number] Chief Complaint  Patient presents with  . Follow-up    HPI  Ann Spencer is a 78 y.o. female presents today due to concerns for her circulation.  The patient contacted our office saying that she has upcoming knee replacement surgery and was concerned about her circulation.  Patient has a previous history of lymphedema and utilizes her lymphedema pump on a nightly basis.  She states that this is a great benefit in her care.  Patient has history of peripheral artery disease however no previous interventions.  She denies any fever, chills, nausea, vomiting or diarrhea.  Today the patient has an ABI of 1.25 on the right and 1.31 on the left.  The patient has strong triphasic waveforms within her bilateral lower extremities as well as her bilateral toes.  Past Medical History:  Diagnosis Date  . Allergy   . History of head injury 11/17/2015  . Hypertension   . Phlebitis 2014  . Pneumonia due to infectious organism 02/15/2018  . Rotator cuff injury 12/2013    Past Surgical History:  Procedure Laterality Date  . ABDOMINAL HYSTERECTOMY  1981   Full  . SHOULDER SURGERY Left 03/03/15  . vein closure Bilateral Feb 2008    Social History   Socioeconomic History  . Marital status: Widowed    Spouse name: Not on file  . Number of children: Not on file  . Years of education: Not on file  . Highest education level: Not on file  Occupational History  . Not on file  Social Needs  . Financial resource strain: Not on file  . Food insecurity    Worry: Not on file    Inability: Not on file  . Transportation needs    Medical: Not on file    Non-medical: Not on file  Tobacco Use  . Smoking status: Former Smoker    Years: 30.00    Quit date: 12/12/1993    Years since quitting: 25.8  . Smokeless tobacco: Never Used  Substance and Sexual Activity  . Alcohol use: Yes    Alcohol/week: 7.0  standard drinks    Types: 7 Standard drinks or equivalent per week    Comment: Wine   . Drug use: No  . Sexual activity: Never  Lifestyle  . Physical activity    Days per week: Not on file    Minutes per session: Not on file  . Stress: Not on file  Relationships  . Social Herbalist on phone: Not on file    Gets together: Not on file    Attends religious service: Not on file    Active member of club or organization: Not on file    Attends meetings of clubs or organizations: Not on file    Relationship status: Not on file  . Intimate partner violence    Fear of current or ex partner: Not on file    Emotionally abused: Not on file    Physically abused: Not on file    Forced sexual activity: Not on file  Other Topics Concern  . Not on file  Social History Narrative   [place]   Lives with daughter and grandaughter    2 dogs lives inside   Enjoys reading    Family History  Problem Relation Age of Onset  . Varicose Veins Brother     Allergies  Allergen Reactions  . Penicillins      Review of Systems   Review of Systems: Negative Unless Checked Constitutional: [] Weight loss  [] Fever  [] Chills Cardiac: [] Chest pain   []  Atrial Fibrillation  [] Palpitations   [] Shortness of breath when laying flat   [] Shortness of breath with exertion. [] Shortness of breath at rest Vascular:  [] Pain in legs with walking   [] Pain in legs with standing [] Pain in legs when laying flat   [] Claudication    [] Pain in feet when laying flat    [] History of DVT   [] Phlebitis   [x] Swelling in legs   [x] Varicose veins   [] Non-healing ulcers Pulmonary:   [] Uses home oxygen   [] Productive cough   [] Hemoptysis   [] Wheeze  [] COPD   [] Asthma Neurologic:  [] Dizziness   [] Seizures  [] Blackouts [] History of stroke   [] History of TIA  [] Aphasia   [] Temporary Blindness   [] Weakness or numbness in arm   [] Weakness or numbness in leg Musculoskeletal:   [] Joint swelling   [] Joint pain   [] Low  back pain  []  History of Knee Replacement [] Arthritis [] back Surgeries  []  Spinal Stenosis    Hematologic:  [] Easy bruising  [] Easy bleeding   [] Hypercoagulable state   [] Anemic Gastrointestinal:  [] Diarrhea   [] Vomiting  [x] Gastroesophageal reflux/heartburn   [] Difficulty swallowing. [] Abdominal pain Genitourinary:  [] Chronic kidney disease   [] Difficult urination  [] Anuric   [] Blood in urine [] Frequent urination  [] Burning with urination   [] Hematuria Skin:  [] Rashes   [] Ulcers [] Wounds Psychological:  [] History of anxiety   []  History of major depression  []  Memory Difficulties      OBJECTIVE:   Physical Exam  BP 134/77 (BP Location: Right Arm)   Pulse 84   Resp 16   Wt 239 lb (108.4 kg)   BMI 39.77 kg/m   Gen: WD/WN, NAD Head: [place]/AT, No temporalis wasting.  Ear/Nose/Throat: Hearing grossly intact, nares w/o erythema or drainage Eyes: PER, EOMI, sclera nonicteric.  Neck: Supple, no masses.  No JVD.  Pulmonary:  Good air movement, no use of accessory muscles.  Cardiac: RRR Vascular:  2+ edema bilaterally Vessel Right Left  Radial Palpable Palpable  Dorsalis Pedis Palpable Palpable  Posterior Tibial Palpable Palpable   Gastrointestinal: soft, non-distended. No guarding/no peritoneal signs.  Musculoskeletal: M/S 5/5 throughout.  No deformity or atrophy.  Neurologic: Pain and light touch intact in extremities.  Symmetrical.  Speech is fluent. Motor exam as listed above. Psychiatric: Judgment intact, Mood & affect appropriate for pt's clinical situation. Dermatologic: No Venous rashes. No Ulcers Noted.  No changes consistent with cellulitis. Lymph : No Cervical lymphadenopathy, no lichenification or skin changes of chronic lymphedema.       ASSESSMENT AND PLAN:  1. Lymphedema Patient will continue to utilize conservative therapy such as medical grade 1 compression stockings, elevation and exercise until her surgery.  She will also continue to use her lymphedema pump.   Following her surgery the patient will likely be in physical therapy which will also help with her exercise.  She should also continue to wear her compression stockings as soon as she is able following her surgery.  We will follow-up with the patient on a daily basis.  2. Hyperlipidemia, unspecified hyperlipidemia type Continue statin as ordered and reviewed, no changes at this time   3. Atherosclerotic peripheral vascular disease ([place]) Today the patient's noninvasive study showed that she had excellent perfusion.  We will reassess her peripheral vascular disease on an as-needed basis.  Patient should have no issues with healing based on her arterial studies today.  4. Gastroesophageal reflux disease, unspecified whether esophagitis present Continue PPI as already ordered, this medication has been reviewed and there are no changes at this time.  Avoidence of caffeine and alcohol  Moderate elevation of the head of the bed    Current Outpatient Medications on File Prior to Visit  Medication Sig Dispense Refill  . aspirin 81 MG tablet Take 81 mg by mouth daily.    . Calcium Carbonate (CALTRATE 600 PO) Take 1 tablet by mouth daily.    . Cholecalciferol (D3 ADULT PO) Take 1 tablet by mouth daily.    . ferrous sulfate 325 (65 FE) MG EC tablet Take 325 mg by mouth daily with breakfast.    . hydrochlorothiazide (HYDRODIURIL) 25 MG tablet Take 1 tablet (25 mg total) by mouth daily. 90 tablet 1  . Misc Natural Products (OSTEO BI-FLEX JOINT SHIELD PO) Take by mouth.    . Multiple Vitamin (MULTIVITAMIN) capsule Take 1 capsule by mouth daily.     Current Facility-Administered Medications on File Prior to Visit  Medication Dose Route Frequency Provider Last Rate Last Dose  . lidocaine (PF) (XYLOCAINE) 1 % injection 10 mL  10 mL Other Once Milinda Pointer, MD        There are no Patient Instructions on file for this visit. No follow-ups on file.   Kris Hartmann, NP  This note was completed  with Sales executive.  Any errors are purely unintentional.

## 2019-10-28 ENCOUNTER — Other Ambulatory Visit: Payer: Self-pay

## 2019-10-28 ENCOUNTER — Encounter
Admission: RE | Admit: 2019-10-28 | Discharge: 2019-10-28 | Disposition: A | Discharge status: Home / Self Care | Payer: Medicare Other | Source: Ambulatory Visit | Attending: Orthopedic Surgery | Admitting: Orthopedic Surgery

## 2019-10-28 ENCOUNTER — Ambulatory Visit
Admission: RE | Admit: 2019-10-28 | Discharge: 2019-10-28 | Disposition: A | Discharge status: Home / Self Care | Payer: Medicare Other | Source: Ambulatory Visit | Attending: Orthopedic Surgery | Admitting: Orthopedic Surgery

## 2019-10-28 DIAGNOSIS — Z79899 Other long term (current) drug therapy: Secondary | ICD-10-CM | POA: Diagnosis not present

## 2019-10-28 DIAGNOSIS — J9811 Atelectasis: Secondary | ICD-10-CM | POA: Diagnosis not present

## 2019-10-28 DIAGNOSIS — Z87891 Personal history of nicotine dependence: Secondary | ICD-10-CM | POA: Diagnosis not present

## 2019-10-28 DIAGNOSIS — K219 Gastro-esophageal reflux disease without esophagitis: Secondary | ICD-10-CM | POA: Insufficient documentation

## 2019-10-28 DIAGNOSIS — E785 Hyperlipidemia, unspecified: Secondary | ICD-10-CM | POA: Insufficient documentation

## 2019-10-28 DIAGNOSIS — Z01811 Encounter for preprocedural respiratory examination: Secondary | ICD-10-CM

## 2019-10-28 DIAGNOSIS — Z01818 Encounter for other preprocedural examination: Secondary | ICD-10-CM | POA: Insufficient documentation

## 2019-10-28 DIAGNOSIS — I10 Essential (primary) hypertension: Secondary | ICD-10-CM | POA: Insufficient documentation

## 2019-10-28 HISTORY — DX: Gastro-esophageal reflux disease without esophagitis: K21.9

## 2019-10-28 HISTORY — DX: Unspecified osteoarthritis, unspecified site: M19.90

## 2019-10-28 HISTORY — DX: Anemia, unspecified: D64.9

## 2019-10-28 LAB — BASIC METABOLIC PANEL
Anion gap: 9 (ref 5–15)
BUN: 18 mg/dL (ref 8–23)
CO2: 30 mmol/L (ref 22–32)
Calcium: 9.3 mg/dL (ref 8.9–10.3)
Chloride: 100 mmol/L (ref 98–111)
Creatinine, Ser: 0.85 mg/dL (ref 0.44–1.00)
GFR calc Af Amer: >60 mL/min (ref >60)
GFR calc non Af Amer: >60 mL/min (ref >60)
Glucose, Bld: 110 mg/dL — ABNORMAL HIGH (ref 70–99)
Potassium: 4.1 mmol/L (ref 3.5–5.1)
Sodium: 139 mmol/L (ref 135–145)

## 2019-10-28 LAB — CBC WITH DIFFERENTIAL/PLATELET
Abs Immature Granulocytes: 0 10*3/uL (ref 0.00–0.07)
Basophils Absolute: 0 10*3/uL (ref 0.0–0.1)
Basophils Relative: 1 %
Eosinophils Absolute: 0.2 10*3/uL (ref 0.0–0.5)
Eosinophils Relative: 5 %
HCT: 43.9 % (ref 36.0–46.0)
Hemoglobin: 15.1 g/dL — ABNORMAL HIGH (ref 12.0–15.0)
Immature Granulocytes: 0 %
Lymphocytes Relative: 23 %
Lymphs Abs: 0.8 10*3/uL (ref 0.7–4.0)
MCH: 33.1 pg (ref 26.0–34.0)
MCHC: 34.4 g/dL (ref 30.0–36.0)
MCV: 96.3 fL (ref 80.0–100.0)
Monocytes Absolute: 0.3 10*3/uL (ref 0.1–1.0)
Monocytes Relative: 10 %
Neutro Abs: 2.1 10*3/uL (ref 1.7–7.7)
Neutrophils Relative %: 61 %
Platelets: 184 10*3/uL (ref 150–400)
RBC: 4.56 MIL/uL (ref 3.87–5.11)
RDW: 12 % (ref 11.5–15.5)
WBC: 3.4 10*3/uL — ABNORMAL LOW (ref 4.0–10.5)
nRBC: 0 % (ref 0.0–0.2)

## 2019-10-28 LAB — TYPE AND SCREEN
ABO/RH(D): A POS
Antibody Screen: NEGATIVE

## 2019-10-28 LAB — PROTIME-INR
INR: 0.9 (ref 0.8–1.2)
Prothrombin Time: 12.4 seconds (ref 11.4–15.2)

## 2019-10-28 LAB — HEMOGLOBIN A1C
Hgb A1c MFr Bld: 5.5 % (ref 4.8–5.6)
Mean Plasma Glucose: 111.15 mg/dL

## 2019-10-28 LAB — URINALYSIS, ROUTINE W REFLEX MICROSCOPIC
Bilirubin Urine: NEGATIVE
Glucose, UA: NEGATIVE mg/dL
Hgb urine dipstick: NEGATIVE
Ketones, ur: NEGATIVE mg/dL
Leukocytes,Ua: NEGATIVE
Nitrite: NEGATIVE
Protein, ur: NEGATIVE mg/dL
Specific Gravity, Urine: 1.011 (ref 1.005–1.030)
pH: 6 (ref 5.0–8.0)

## 2019-10-28 LAB — SURGICAL PCR SCREEN
MRSA, PCR: NEGATIVE
Staphylococcus aureus: NEGATIVE

## 2019-10-28 LAB — APTT: aPTT: 26 seconds (ref 24–36)

## 2019-10-28 NOTE — Patient Instructions (Addendum)
Your procedure is scheduled on: 11-05-19 TUESDAY Report to Same Day Surgery 2nd floor medical mall [facility] Entrance-take elevator on left to 2nd floor.  Check in with surgery information desk.) To find out your arrival time please call [phone] between 1PM - 3PM on 11-04-19 MONDAY  Remember: Instructions that are not followed completely may result in serious medical risk, up to and including death, or upon the discretion of your surgeon and anesthesiologist your surgery may need to be rescheduled.    _x___ 1. Do not eat food after midnight the night before your procedure. NO GUM OR CANDY AFTER MIDNIGHT. You may drink clear liquids up to 2 hours before you are scheduled to arrive at the hospital for your procedure.  Do not drink clear liquids within 2 hours of your scheduled arrival to the hospital.  Clear liquids include  --Water or Apple juice without pulp  --Gatorade  --Black Coffee or Clear Tea (No milk, no creamers, do not add anything to the coffee or Tea   ____Ensure clear carbohydrate drink on the way to the hospital for bariatric patients  ____Ensure clear carbohydrate drink 3 hours before surgery.     __x__ 2. No Alcohol for 24 hours before or after surgery.   __x__3. No Smoking or e-cigarettes for 24 prior to surgery.  Do not use any chewable tobacco products for at least 6 hour prior to surgery   ____  4. Bring all medications with you on the day of surgery if instructed.    __x__ 5. Notify your doctor if there is any change in your medical condition     (cold, fever, infections).    x___6. On the morning of surgery brush your teeth with toothpaste and water.  You may rinse your mouth with mouth wash if you wish.  Do not swallow any toothpaste or mouthwash.   Do not wear jewelry, make-up, hairpins, clips or nail polish.  Do not wear lotions, powders, or perfumes.   Do not shave 48 hours prior to surgery. Men may shave face and neck.  Do not bring valuables to  the hospital.    [facility] is not responsible for any belongings or valuables.               Contacts, dentures or bridgework may not be worn into surgery.  Leave your suitcase in the car. After surgery it may be brought to your room.  For patients admitted to the hospital, discharge time is determined by your treatment team.  _  Patients discharged the day of surgery will not be allowed to drive home.  You will need someone to drive you home and stay with you the night of your procedure.    Please read over the following fact sheets that you were given:   [facility] Preparing for Surgery and or MRSA Information   _x___ TAKE THE FOLLOWING MEDICATION THE MORNING OF SURGERY WITH A SMALL SIP OF WATER. These include:  1. OMEPRAZOLE (PRILOSEC)  2. TAKE AN OMEPRAZOLE THE NIGHT BEFORE YOUR SURGERY   3.  4.  5.  6.  ____Fleets enema or Magnesium Citrate as directed.   _x___ Use CHG Soap or sage wipes as directed on instruction sheet   ____ Use inhalers on the day of surgery and bring to hospital day of surgery  ____ Stop Metformin and Janumet 2 days prior to surgery.    ____ Take 1/2 of usual insulin dose the night before surgery and none on  the morning surgery.   _x___ Follow recommendations from Cardiologist, Pulmonologist or PCP regarding stopping Aspirin, Coumadin, Plavix ,Eliquis, Effient, or Pradaxa, and Pletal-STOP ASPIRIN NOW  X____Stop Anti-inflammatories such as Advil, Aleve, Ibuprofen, Motrin, Naproxen, Naprosyn, Goodies powders or aspirin products NOW-OK to take Tylenol   _x___ Stop supplements until after surgery-STOP OSTEO BI-FLEX NOW-MAY RESUME AFTER SURGERY   ____ Bring C-Pap to the hospital.

## 2019-11-01 ENCOUNTER — Other Ambulatory Visit: Payer: Self-pay

## 2019-11-01 ENCOUNTER — Other Ambulatory Visit
Admission: RE | Admit: 2019-11-01 | Discharge: 2019-11-01 | Disposition: A | Discharge status: Home / Self Care | Payer: Medicare Other | Source: Ambulatory Visit | Attending: Orthopedic Surgery | Admitting: Orthopedic Surgery

## 2019-11-01 DIAGNOSIS — Z20828 Contact with and (suspected) exposure to other viral communicable diseases: Secondary | ICD-10-CM | POA: Diagnosis not present

## 2019-11-01 DIAGNOSIS — Z01812 Encounter for preprocedural laboratory examination: Secondary | ICD-10-CM | POA: Diagnosis not present

## 2019-11-01 LAB — SARS CORONAVIRUS 2 (TAT 6-24 HRS): SARS Coronavirus 2: NEGATIVE

## 2019-11-04 NOTE — Pre-Procedure Instructions (Signed)
SECURE CHAT WITH DR Ola Spurr REGARDING CXR WITH MINIMAL BIL ATELECTASIS. PT ASYMPTOMATIC. OK TO PROCEED PER DR Ola Spurr

## 2019-11-04 NOTE — Pre-Procedure Instructions (Signed)
Kris Hartmann, NP  Nurse Practitioner  Vascular Surgery  Progress Notes  Signed  Encounter Date:  10/17/2019      Related encounter: Office Visit from 10/17/2019 in [facility]      Signed         Show:Clear all [x] Manual[x] Template[] Copied  Added by: [x] Kris Hartmann, NP  [] Hover for details   SUBJECTIVE:  Patient ID: Ann Spencer, female    DOB: [birth year], 78 y.o.   MRN: [id number]    Chief Complaint  Patient presents with  . Follow-up    HPI  [name] is a 78 y.o. female presents today due to concerns for her circulation.  The patient contacted our office saying that she has upcoming knee replacement surgery and was concerned about her circulation.  Patient has a previous history of lymphedema and utilizes her lymphedema pump on a nightly basis.  She states that this is a great benefit in her care.  Patient has history of peripheral artery disease however no previous interventions.  She denies any fever, chills, nausea, vomiting or diarrhea.  Today the patient has an ABI of 1.25 on the right and 1.31 on the left.  The patient has strong triphasic waveforms within her bilateral lower extremities as well as her bilateral toes.      Past Medical History:  Diagnosis Date  . Allergy   . History of head injury 11/17/2015  . Hypertension   . Phlebitis 2014  . Pneumonia due to infectious organism 02/15/2018  . Rotator cuff injury 12/2013         Past Surgical History:  Procedure Laterality Date  . ABDOMINAL HYSTERECTOMY  1981   Full  . SHOULDER SURGERY Left 03/03/15  . vein closure Bilateral Feb 2008    Social History        Socioeconomic History  . Marital status: Widowed    Spouse name: Not on file  . Number of children: Not on file  . Years of education: Not on file  . Highest education level: Not on file  Occupational History  . Not on file  Social Needs  . Financial resource strain: Not on file  .  Food insecurity    Worry: Not on file    Inability: Not on file  . Transportation needs    Medical: Not on file    Non-medical: Not on file  Tobacco Use  . Smoking status: Former Smoker    Years: 30.00    Quit date: 12/12/1993    Years since quitting: 25.8  . Smokeless tobacco: Never Used  Substance and Sexual Activity  . Alcohol use: Yes    Alcohol/week: 7.0 standard drinks    Types: 7 Standard drinks or equivalent per week    Comment: Wine   . Drug use: No  . Sexual activity: Never  Lifestyle  . Physical activity    Days per week: Not on file    Minutes per session: Not on file  . Stress: Not on file  Relationships  . Social Herbalist on phone: Not on file    Gets together: Not on file    Attends religious service: Not on file    Active member of club or organization: Not on file    Attends meetings of clubs or organizations: Not on file    Relationship status: Not on file  . Intimate partner violence    Fear of current or ex partner: Not on  file    Emotionally abused: Not on file    Physically abused: Not on file    Forced sexual activity: Not on file  Other Topics Concern  . Not on file  Social History Narrative   [place]   Lives with daughter and grandaughter    2 dogs lives inside   Enjoys reading         Family History  Problem Relation Age of Onset  . Varicose Veins Brother         Allergies  Allergen Reactions  . Penicillins      Review of Systems   Review of Systems: Negative Unless Checked Constitutional: [] ?Weight loss[] ?Fever[] ?Chills Cardiac:[] ?Chest pain[] ? Atrial Fibrillation[] ?Palpitations [] ?Shortness of breath when laying flat [] ?Shortness of breath with exertion. [] ?Shortness of breath at rest Vascular: [] ?Pain in legs with walking[] ?Pain in legswith standing[] ?Pain in legs when laying flat [] ?Claudication  [] ?Pain in feet when  laying flat [] ?History of DVT [] ?Phlebitis [x] ?Swelling in legs [x] ?Varicose veins [] ?Non-healing ulcers Pulmonary: [] ?Uses home oxygen [] ?Productive cough[] ?Hemoptysis [] ?Wheeze [] ?COPD [] ?Asthma Neurologic: [] ?Dizziness[] ?Seizures [] ?Blackouts[] ?History of stroke [] ?History of TIA[] ?Aphasia [] ?Temporary Blindness[] ?Weaknessor numbness in arm [] ?Weakness or numbnessin leg Musculoskeletal:[] ?Joint swelling [] ?Joint pain [] ?Low back pain  [] ? History of Knee Replacement [] ?Arthritis [] ?back Surgeries[] ? Spinal Stenosis  Hematologic:[] ?Easy bruising[] ?Easy bleeding [] ?Hypercoagulable state [] ?Anemic Gastrointestinal:[] ?Diarrhea [] ?Vomiting[x] ?Gastroesophageal reflux/heartburn[] ?Difficulty swallowing. [] ?Abdominal pain Genitourinary: [] ?Chronic kidney disease [] ?Difficulturination [] ?Anuric[] ?Blood in urine [] ?Frequenturination [] ?Burning with urination[] ?Hematuria Skin: [] ?Rashes [] ?Ulcers [] ?Wounds Psychological: [] ?History of anxiety[] ?History of major depression  [] ? Memory Difficulties      OBJECTIVE:   Physical Exam  BP 134/77 (BP Location: Right Arm)   Pulse 84   Resp 16   Wt 239 lb (108.4 kg)   BMI 39.77 kg/m   Gen: WD/WN, NAD Head: [place]/AT, No temporalis wasting.  Ear/Nose/Throat: Hearing grossly intact, nares w/o erythema or drainage Eyes: PER, EOMI, sclera nonicteric.  Neck: Supple, no masses. No JVD.  Pulmonary: Good air movement, no use of accessory muscles.  Cardiac: RRR Vascular: 2+ edema bilaterally Vessel Right Left  Radial Palpable Palpable  Dorsalis Pedis Palpable Palpable  Posterior Tibial Palpable Palpable   Gastrointestinal: soft, non-distended. No guarding/no peritoneal signs.  Musculoskeletal: M/S 5/5 throughout. No deformity or atrophy.  Neurologic: Pain and light touch intact in extremities. Symmetrical. Speech is fluent. Motor exam as listed above.  Psychiatric: Judgment intact, Mood & affect appropriate for pt's clinical situation. Dermatologic:No Venousrashes. No Ulcers Noted. No changes consistent with cellulitis. Lymph : No Cervical lymphadenopathy, no lichenification or skin changes of chronic lymphedema.       ASSESSMENT AND PLAN:  1. Lymphedema Patient will continue to utilize conservative therapy such as medical grade 1 compression stockings, elevation and exercise until her surgery.  She will also continue to use her lymphedema pump.  Following her surgery the patient will likely be in physical therapy which will also help with her exercise.  She should also continue to wear her compression stockings as soon as she is able following her surgery.  We will follow-up with the patient on a daily basis.  2. Hyperlipidemia, unspecified hyperlipidemia type Continue statin as ordered and reviewed, no changes at this time   3. Atherosclerotic peripheral vascular disease ([place]) Today the patient's noninvasive study showed that she had excellent perfusion.  We will reassess her peripheral vascular disease on an as-needed basis.  Patient should have no issues with healing based on her arterial studies today.  4. Gastroesophageal reflux disease, unspecified whether esophagitis present Continue PPI as already ordered, this medication has been  reviewed and there are no changes at this time.  Avoidence of caffeine and alcohol  Moderate elevation of the head of the bed          Current Outpatient Medications on File Prior to Visit  Medication Sig Dispense Refill  . aspirin 81 MG tablet Take 81 mg by mouth daily.    . Calcium Carbonate (CALTRATE 600 PO) Take 1 tablet by mouth daily.    . Cholecalciferol (D3 ADULT PO) Take 1 tablet by mouth daily.    . ferrous sulfate 325 (65 FE) MG EC tablet Take 325 mg by mouth daily with breakfast.    . hydrochlorothiazide (HYDRODIURIL) 25 MG tablet Take 1 tablet (25 mg total) by  mouth daily. 90 tablet 1  . Misc Natural Products (OSTEO BI-FLEX JOINT SHIELD PO) Take by mouth.    . Multiple Vitamin (MULTIVITAMIN) capsule Take 1 capsule by mouth daily.              Current Facility-Administered Medications on File Prior to Visit  Medication Dose Route Frequency Provider Last Rate Last Dose  . lidocaine (PF) (XYLOCAINE) 1 % injection 10 mL  10 mL Other Once Milinda Pointer, MD        There are no Patient Instructions on file for this visit. No follow-ups on file.   Kris Hartmann, NP  This note was completed with Sales executive.  Any errors are purely unintentional.

## 2019-11-05 ENCOUNTER — Inpatient Hospital Stay
Admission: RE | Admit: 2019-11-05 | Discharge: 2019-11-08 | DRG: 470 | Disposition: A | Discharge status: Home Health | Payer: Medicare Other | Attending: Orthopedic Surgery | Admitting: Orthopedic Surgery

## 2019-11-05 ENCOUNTER — Other Ambulatory Visit: Payer: Self-pay

## 2019-11-05 ENCOUNTER — Inpatient Hospital Stay: Payer: Medicare Other

## 2019-11-05 ENCOUNTER — Inpatient Hospital Stay: Payer: Medicare Other | Admitting: Anesthesiology

## 2019-11-05 ENCOUNTER — Encounter
Admission: RE | Disposition: A | Discharge status: Home Health | Payer: Self-pay | Source: Home / Self Care | Attending: Orthopedic Surgery

## 2019-11-05 ENCOUNTER — Encounter: Payer: Self-pay | Admitting: *Deleted

## 2019-11-05 DIAGNOSIS — Z87891 Personal history of nicotine dependence: Secondary | ICD-10-CM

## 2019-11-05 DIAGNOSIS — M1712 Unilateral primary osteoarthritis, left knee: Principal | ICD-10-CM | POA: Diagnosis present

## 2019-11-05 DIAGNOSIS — Z96652 Presence of left artificial knee joint: Secondary | ICD-10-CM | POA: Diagnosis not present

## 2019-11-05 DIAGNOSIS — K219 Gastro-esophageal reflux disease without esophagitis: Secondary | ICD-10-CM | POA: Diagnosis present

## 2019-11-05 DIAGNOSIS — Z471 Aftercare following joint replacement surgery: Secondary | ICD-10-CM | POA: Diagnosis not present

## 2019-11-05 DIAGNOSIS — Z7982 Long term (current) use of aspirin: Secondary | ICD-10-CM

## 2019-11-05 DIAGNOSIS — I10 Essential (primary) hypertension: Secondary | ICD-10-CM | POA: Diagnosis present

## 2019-11-05 HISTORY — PX: TOTAL KNEE ARTHROPLASTY: SHX125

## 2019-11-05 LAB — ABO/RH: ABO/RH(D): A POS

## 2019-11-05 SURGERY — ARTHROPLASTY, KNEE, TOTAL
Anesthesia: Spinal | Site: Knee | Laterality: Left

## 2019-11-05 MED ORDER — METHOCARBAMOL 500 MG PO TABS
500.0000 mg | ORAL_TABLET | Freq: Four times a day (QID) | ORAL | Status: DC | PRN
  Filled 2019-11-05: qty 1

## 2019-11-05 MED ORDER — MORPHINE SULFATE 4 MG/ML IJ SOLN
INTRAMUSCULAR | Status: DC | PRN
  Administered 2019-11-05: 4 mg

## 2019-11-05 MED ORDER — LIDOCAINE HCL 1 % IJ SOLN
INTRAMUSCULAR | Status: DC | PRN
  Administered 2019-11-05: 3 mL

## 2019-11-05 MED ORDER — ONDANSETRON HCL 4 MG/2ML IJ SOLN: 4.0000 mg | Freq: Four times a day (QID) | INTRAMUSCULAR | Status: DC | PRN

## 2019-11-05 MED ORDER — BUPIVACAINE-EPINEPHRINE (PF) 0.25% -1:200000 IJ SOLN
INTRAMUSCULAR | Status: AC
  Filled 2019-11-05: qty 60

## 2019-11-05 MED ORDER — LACTATED RINGERS IV SOLN
INTRAVENOUS | Status: DC
  Administered 2019-11-05 (×2): via INTRAVENOUS

## 2019-11-05 MED ORDER — TRANEXAMIC ACID 1000 MG/10ML IV SOLN
INTRAVENOUS | Status: AC
  Filled 2019-11-05: qty 10

## 2019-11-05 MED ORDER — ENOXAPARIN SODIUM 40 MG/0.4ML ~~LOC~~ SOLN
40.0000 mg | SUBCUTANEOUS | Status: DC
  Administered 2019-11-06 – 2019-11-08 (×3): 40 mg via SUBCUTANEOUS
  Filled 2019-11-05 (×4): qty 0.4

## 2019-11-05 MED ORDER — CLINDAMYCIN PHOSPHATE 600 MG/50ML IV SOLN
600.0000 mg | Freq: Once | INTRAVENOUS | Status: AC
  Administered 2019-11-05: 600 mg via INTRAVENOUS

## 2019-11-05 MED ORDER — VANCOMYCIN HCL IN DEXTROSE 1-5 GM/200ML-% IV SOLN
1000.0000 mg | INTRAVENOUS | Status: AC
  Administered 2019-11-05: 07:00:00 1000 mg via INTRAVENOUS

## 2019-11-05 MED ORDER — CHLORHEXIDINE GLUCONATE CLOTH 2 % EX PADS: 6.0000 | MEDICATED_PAD | Freq: Once | CUTANEOUS | Status: DC

## 2019-11-05 MED ORDER — NEOMYCIN-POLYMYXIN B GU 40-200000 IR SOLN
Status: AC
  Filled 2019-11-05: qty 20

## 2019-11-05 MED ORDER — CLINDAMYCIN PHOSPHATE 600 MG/50ML IV SOLN
INTRAVENOUS | Status: AC
  Filled 2019-11-05: qty 50

## 2019-11-05 MED ORDER — PROPOFOL 500 MG/50ML IV EMUL
INTRAVENOUS | Status: AC
  Filled 2019-11-05: qty 100

## 2019-11-05 MED ORDER — SODIUM CHLORIDE 0.9 % IV SOLN
INTRAVENOUS | Status: DC
  Administered 2019-11-05 (×2): via INTRAVENOUS

## 2019-11-05 MED ORDER — MIDAZOLAM HCL 2 MG/2ML IJ SOLN
INTRAMUSCULAR | Status: DC | PRN
  Administered 2019-11-05: 1 mg via INTRAVENOUS

## 2019-11-05 MED ORDER — ACETAMINOPHEN 10 MG/ML IV SOLN
INTRAVENOUS | Status: DC | PRN
  Administered 2019-11-05: 1000 mg via INTRAVENOUS

## 2019-11-05 MED ORDER — TRAMADOL HCL 50 MG PO TABS
ORAL_TABLET | ORAL | Status: AC
  Filled 2019-11-05: qty 1

## 2019-11-05 MED ORDER — METHOCARBAMOL 1000 MG/10ML IJ SOLN
500.0000 mg | Freq: Four times a day (QID) | INTRAVENOUS | Status: DC | PRN
  Filled 2019-11-05: qty 5

## 2019-11-05 MED ORDER — ACETAMINOPHEN 325 MG PO TABS
325.0000 mg | ORAL_TABLET | Freq: Four times a day (QID) | ORAL | Status: DC | PRN
  Administered 2019-11-06: 650 mg via ORAL
  Filled 2019-11-05: qty 2

## 2019-11-05 MED ORDER — BUPIVACAINE LIPOSOME 1.3 % IJ SUSP
INTRAMUSCULAR | Status: AC
  Filled 2019-11-05: qty 20

## 2019-11-05 MED ORDER — DEXAMETHASONE SODIUM PHOSPHATE 10 MG/ML IJ SOLN
INTRAMUSCULAR | Status: AC
  Filled 2019-11-05: qty 1

## 2019-11-05 MED ORDER — ACETAMINOPHEN 500 MG PO TABS: 1000.0000 mg | ORAL_TABLET | Freq: Four times a day (QID) | ORAL | Status: DC

## 2019-11-05 MED ORDER — GABAPENTIN 300 MG PO CAPS
300.0000 mg | ORAL_CAPSULE | Freq: Three times a day (TID) | ORAL | Status: DC
  Administered 2019-11-05 – 2019-11-08 (×9): 300 mg via ORAL
  Filled 2019-11-05 (×8): qty 1

## 2019-11-05 MED ORDER — HYDROCHLOROTHIAZIDE 25 MG PO TABS
25.0000 mg | ORAL_TABLET | Freq: Every day | ORAL | Status: DC
  Administered 2019-11-05 – 2019-11-08 (×4): 25 mg via ORAL
  Filled 2019-11-05 (×4): qty 1

## 2019-11-05 MED ORDER — SODIUM CHLORIDE 0.9 % IV SOLN
INTRAVENOUS | Status: DC | PRN
  Administered 2019-11-05: 40 ug/min via INTRAVENOUS

## 2019-11-05 MED ORDER — PANTOPRAZOLE SODIUM 40 MG PO TBEC
40.0000 mg | DELAYED_RELEASE_TABLET | Freq: Every day | ORAL | Status: DC
  Administered 2019-11-05 – 2019-11-08 (×4): 40 mg via ORAL
  Filled 2019-11-05 (×4): qty 1

## 2019-11-05 MED ORDER — OXYCODONE HCL 5 MG PO TABS
5.0000 mg | ORAL_TABLET | ORAL | Status: DC | PRN
  Administered 2019-11-06 – 2019-11-08 (×4): 5 mg via ORAL
  Filled 2019-11-05: qty 1
  Filled 2019-11-05: qty 2
  Filled 2019-11-05 (×3): qty 1

## 2019-11-05 MED ORDER — VANCOMYCIN HCL IN DEXTROSE 1-5 GM/200ML-% IV SOLN
INTRAVENOUS | Status: AC
  Administered 2019-11-05: 1000 mg via INTRAVENOUS
  Filled 2019-11-05: qty 200

## 2019-11-05 MED ORDER — FERROUS SULFATE 325 (65 FE) MG PO TABS
325.0000 mg | ORAL_TABLET | Freq: Every day | ORAL | Status: DC
  Administered 2019-11-06 – 2019-11-08 (×3): 325 mg via ORAL
  Filled 2019-11-05 (×7): qty 1

## 2019-11-05 MED ORDER — ONDANSETRON HCL 4 MG PO TABS: 4.0000 mg | ORAL_TABLET | Freq: Four times a day (QID) | ORAL | Status: DC | PRN

## 2019-11-05 MED ORDER — ACETAMINOPHEN 500 MG PO TABS
1000.0000 mg | ORAL_TABLET | Freq: Four times a day (QID) | ORAL | Status: AC
  Administered 2019-11-05 (×2): 1000 mg via ORAL
  Filled 2019-11-05 (×2): qty 2

## 2019-11-05 MED ORDER — ASPIRIN 81 MG PO TBEC
81.0000 mg | DELAYED_RELEASE_TABLET | Freq: Every day | ORAL | Status: DC
  Administered 2019-11-05 – 2019-11-08 (×4): 81 mg via ORAL
  Filled 2019-11-05 (×7): qty 1

## 2019-11-05 MED ORDER — SENNOSIDES-DOCUSATE SODIUM 8.6-50 MG PO TABS
1.0000 | ORAL_TABLET | Freq: Every evening | ORAL | Status: DC | PRN
  Filled 2019-11-05: qty 1

## 2019-11-05 MED ORDER — TRAMADOL HCL 50 MG PO TABS
50.0000 mg | ORAL_TABLET | Freq: Four times a day (QID) | ORAL | Status: DC
  Administered 2019-11-05 – 2019-11-08 (×11): 50 mg via ORAL
  Filled 2019-11-05 (×12): qty 1

## 2019-11-05 MED ORDER — LIDOCAINE HCL (PF) 2 % IJ SOLN
INTRAMUSCULAR | Status: AC
  Filled 2019-11-05: qty 10

## 2019-11-05 MED ORDER — VITAMIN D3 25 MCG (1000 UNIT) PO TABS
1000.0000 [IU] | ORAL_TABLET | Freq: Every day | ORAL | Status: DC
  Administered 2019-11-05 – 2019-11-08 (×4): 1000 [IU] via ORAL
  Filled 2019-11-05 (×7): qty 1

## 2019-11-05 MED ORDER — MORPHINE SULFATE (PF) 4 MG/ML IV SOLN
INTRAVENOUS | Status: AC
  Filled 2019-11-05: qty 1

## 2019-11-05 MED ORDER — SODIUM CHLORIDE 0.9 % IV SOLN
INTRAVENOUS | Status: DC | PRN
  Administered 2019-11-05: 60 mL

## 2019-11-05 MED ORDER — ADULT MULTIVITAMIN W/MINERALS CH
1.0000 | ORAL_TABLET | Freq: Every day | ORAL | Status: DC
  Administered 2019-11-05 – 2019-11-08 (×4): 1 via ORAL
  Filled 2019-11-05 (×4): qty 1

## 2019-11-05 MED ORDER — TRANEXAMIC ACID-NACL 1000-0.7 MG/100ML-% IV SOLN
1000.0000 mg | INTRAVENOUS | Status: AC
  Administered 2019-11-05: 1000 mg via INTRAVENOUS

## 2019-11-05 MED ORDER — ONDANSETRON HCL 4 MG/2ML IJ SOLN: 4.0000 mg | Freq: Once | INTRAMUSCULAR | Status: DC | PRN

## 2019-11-05 MED ORDER — BUPIVACAINE HCL (PF) 0.5 % IJ SOLN
INTRAMUSCULAR | Status: DC | PRN
  Administered 2019-11-05: 3 mL via INTRATHECAL

## 2019-11-05 MED ORDER — SODIUM CHLORIDE (PF) 0.9 % IJ SOLN
INTRAMUSCULAR | Status: AC
  Filled 2019-11-05: qty 50

## 2019-11-05 MED ORDER — FENTANYL CITRATE (PF) 100 MCG/2ML IJ SOLN: 25.0000 ug | INTRAMUSCULAR | Status: DC | PRN

## 2019-11-05 MED ORDER — BISACODYL 5 MG PO TBEC
10.0000 mg | DELAYED_RELEASE_TABLET | Freq: Every day | ORAL | Status: DC | PRN
  Administered 2019-11-06 – 2019-11-07 (×2): 10 mg via ORAL
  Filled 2019-11-05 (×4): qty 2

## 2019-11-05 MED ORDER — LIDOCAINE HCL (CARDIAC) PF 100 MG/5ML IV SOSY
PREFILLED_SYRINGE | INTRAVENOUS | Status: DC | PRN
  Administered 2019-11-05: 60 mg via INTRAVENOUS

## 2019-11-05 MED ORDER — TRANEXAMIC ACID-NACL 1000-0.7 MG/100ML-% IV SOLN
INTRAVENOUS | Status: AC
  Filled 2019-11-05: qty 100

## 2019-11-05 MED ORDER — PROPOFOL 10 MG/ML IV BOLUS
INTRAVENOUS | Status: DC | PRN
  Administered 2019-11-05 (×3): 22 mg via INTRAVENOUS

## 2019-11-05 MED ORDER — DEXAMETHASONE SODIUM PHOSPHATE 10 MG/ML IJ SOLN
INTRAMUSCULAR | Status: DC | PRN
  Administered 2019-11-05: 10 mg via INTRAVENOUS

## 2019-11-05 MED ORDER — OXYCODONE HCL 5 MG PO TABS
10.0000 mg | ORAL_TABLET | ORAL | Status: DC | PRN
  Filled 2019-11-05: qty 3

## 2019-11-05 MED ORDER — DOCUSATE SODIUM 100 MG PO CAPS
100.0000 mg | ORAL_CAPSULE | Freq: Two times a day (BID) | ORAL | Status: DC
  Administered 2019-11-05 – 2019-11-08 (×7): 100 mg via ORAL
  Filled 2019-11-05 (×7): qty 1

## 2019-11-05 MED ORDER — KETOROLAC TROMETHAMINE 30 MG/ML IJ SOLN
INTRAMUSCULAR | Status: AC
  Filled 2019-11-05: qty 1

## 2019-11-05 MED ORDER — SODIUM CHLORIDE FLUSH 0.9 % IV SOLN
INTRAVENOUS | Status: AC
  Filled 2019-11-05: qty 40

## 2019-11-05 MED ORDER — ONDANSETRON HCL 4 MG/2ML IJ SOLN
INTRAMUSCULAR | Status: AC
  Filled 2019-11-05: qty 2

## 2019-11-05 MED ORDER — NEOMYCIN-POLYMYXIN B GU 40-200000 IR SOLN
Status: DC | PRN
  Administered 2019-11-05: 16 mL

## 2019-11-05 MED ORDER — VANCOMYCIN HCL IN DEXTROSE 1-5 GM/200ML-% IV SOLN: 1000.0000 mg | Freq: Two times a day (BID) | INTRAVENOUS | Status: AC

## 2019-11-05 MED ORDER — MIDAZOLAM HCL 2 MG/2ML IJ SOLN
INTRAMUSCULAR | Status: AC
  Filled 2019-11-05: qty 2

## 2019-11-05 MED ORDER — HYDROMORPHONE HCL 1 MG/ML IJ SOLN: 0.5000 mg | INTRAMUSCULAR | Status: DC | PRN

## 2019-11-05 MED ORDER — BUPIVACAINE-EPINEPHRINE 0.25% -1:200000 IJ SOLN
INTRAMUSCULAR | Status: DC | PRN
  Administered 2019-11-05: 60 mL

## 2019-11-05 MED ORDER — ONDANSETRON HCL 4 MG/2ML IJ SOLN
INTRAMUSCULAR | Status: DC | PRN
  Administered 2019-11-05: 4 mg via INTRAVENOUS

## 2019-11-05 MED ORDER — PROPOFOL 500 MG/50ML IV EMUL
INTRAVENOUS | Status: DC | PRN
  Administered 2019-11-05: 50 ug/kg/min via INTRAVENOUS

## 2019-11-05 MED ORDER — FLEET ENEMA 7-19 GM/118ML RE ENEM: 1.0000 | ENEMA | Freq: Once | RECTAL | Status: DC | PRN

## 2019-11-05 MED ORDER — KETOROLAC TROMETHAMINE 30 MG/ML IJ SOLN
INTRAMUSCULAR | Status: DC | PRN
  Administered 2019-11-05: 30 mg

## 2019-11-05 MED ORDER — GABAPENTIN 300 MG PO CAPS
ORAL_CAPSULE | ORAL | Status: AC
  Administered 2019-11-05: 300 mg via ORAL
  Filled 2019-11-05: qty 1

## 2019-11-05 MED ORDER — ACETAMINOPHEN 10 MG/ML IV SOLN
INTRAVENOUS | Status: AC
  Filled 2019-11-05: qty 100

## 2019-11-05 SURGICAL SUPPLY — 69 items
BLADE SAW 90X13X1.19 OSCILLAT (BLADE) ×3 IMPLANT
BLADE SAW 90X25X1.19 OSCILLAT (BLADE) ×3 IMPLANT
CANISTER SUCT 3000ML PPV (MISCELLANEOUS) ×3 IMPLANT
CEMENT HV SMART SET (Cement) ×6 IMPLANT
CEMENT TIBIA MBT (Knees) ×1 IMPLANT
CNTNR SPEC 2.5X3XGRAD LEK (MISCELLANEOUS) ×1
CONT SPEC 4OZ STER OR WHT (MISCELLANEOUS) ×2
CONTAINER SPEC 2.5X3XGRAD LEK (MISCELLANEOUS) ×1 IMPLANT
COOLER ICEMAN CLASSIC (MISCELLANEOUS) ×3 IMPLANT
COVER WAND RF STERILE (DRAPES) ×3 IMPLANT
CUFF TOURN SGL QUICK 24 (TOURNIQUET CUFF)
CUFF TOURN SGL QUICK 30 (TOURNIQUET CUFF) ×2
CUFF TRNQT CYL 24X4X16.5-23 (TOURNIQUET CUFF) IMPLANT
CUFF TRNQT CYL 30X4X21-28X (TOURNIQUET CUFF) ×1 IMPLANT
DECANTER SPIKE VIAL GLASS SM (MISCELLANEOUS) ×6 IMPLANT
DRAPE 3/4 80X56 (DRAPES) ×6 IMPLANT
DRAPE IMP U-DRAPE 54X76 (DRAPES) ×6 IMPLANT
DRAPE INCISE IOBAN 66X60 STRL (DRAPES) ×3 IMPLANT
DRAPE SURG 17X11 SM STRL (DRAPES) ×6 IMPLANT
DRSG OPSITE POSTOP 4X12 (GAUZE/BANDAGES/DRESSINGS) ×3 IMPLANT
DRSG OPSITE POSTOP 4X14 (GAUZE/BANDAGES/DRESSINGS) ×3 IMPLANT
DURAPREP 26ML APPLICATOR (WOUND CARE) ×9 IMPLANT
ELECT REM PT RETURN 9FT ADLT (ELECTROSURGICAL) ×3
ELECTRODE REM PT RTRN 9FT ADLT (ELECTROSURGICAL) ×1 IMPLANT
GAUZE SPONGE 4X4 12PLY STRL (GAUZE/BANDAGES/DRESSINGS) ×3 IMPLANT
GLOVE BIOGEL PI IND STRL 7.5 (GLOVE) ×6 IMPLANT
GLOVE BIOGEL PI IND STRL 9 (GLOVE) ×1 IMPLANT
GLOVE BIOGEL PI INDICATOR 7.5 (GLOVE) ×12
GLOVE BIOGEL PI INDICATOR 9 (GLOVE) ×2
GLOVE SURG 9.0 ORTHO LTXF (GLOVE) ×9 IMPLANT
GOWN STRL REUS TWL 2XL XL LVL4 (GOWN DISPOSABLE) ×3 IMPLANT
GOWN STRL REUS W/ TWL LRG LVL3 (GOWN DISPOSABLE) ×4 IMPLANT
GOWN STRL REUS W/ TWL LRG LVL4 (GOWN DISPOSABLE) ×1 IMPLANT
GOWN STRL REUS W/TWL LRG LVL3 (GOWN DISPOSABLE) ×8
GOWN STRL REUS W/TWL LRG LVL4 (GOWN DISPOSABLE) ×2
HOLDER FOLEY CATH W/STRAP (MISCELLANEOUS) ×3 IMPLANT
IMMBOLIZER KNEE 19 BLUE UNIV (SOFTGOODS) ×3 IMPLANT
IMPL FEMUR SIGMA LT PS SZ 3 (Knees) ×1 IMPLANT
IMPLANT FEMUR SIGMA LT PS SZ 3 (Knees) ×3 IMPLANT
INSERT TIBIAL PFC SIG SZ3 10MM (Knees) ×3 IMPLANT
KIT TURNOVER KIT A (KITS) ×3 IMPLANT
MANIFOLD NEPTUNE II (INSTRUMENTS) ×3 IMPLANT
NDL SAFETY ECLIPSE 18X1.5 (NEEDLE) ×1 IMPLANT
NEEDLE HYPO 18GX1.5 SHARP (NEEDLE) ×2
NEEDLE HYPO 22GX1.5 SAFETY (NEEDLE) ×3 IMPLANT
NEEDLE SPNL 20GX3.5 QUINCKE YW (NEEDLE) ×3 IMPLANT
NS IRRIG 1000ML POUR BTL (IV SOLUTION) ×3 IMPLANT
PACK TOTAL KNEE (MISCELLANEOUS) ×3 IMPLANT
PAD WRAPON POLAR KNEE (MISCELLANEOUS) ×1 IMPLANT
PATELLA DOME PFC 35MM (Knees) ×3 IMPLANT
PENCIL SMOKE ULTRAEVAC 22 CON (MISCELLANEOUS) ×3 IMPLANT
PULSAVAC PLUS IRRIG FAN TIP (DISPOSABLE) ×3
SOL .9 NS 3000ML IRR  AL (IV SOLUTION) ×2
SOL .9 NS 3000ML IRR UROMATIC (IV SOLUTION) ×1 IMPLANT
SPONGE LAP 18X18 RF (DISPOSABLE) IMPLANT
STAPLER SKIN PROX 35W (STAPLE) ×3 IMPLANT
SUCTION FRAZIER HANDLE 10FR (MISCELLANEOUS) ×2
SUCTION TUBE FRAZIER 10FR DISP (MISCELLANEOUS) ×1 IMPLANT
SUT ETHIBOND NAB CT1 #1 30IN (SUTURE) ×6 IMPLANT
SUT VIC AB 0 CT1 36 (SUTURE) ×3 IMPLANT
SUT VIC AB 2-0 CT1 (SUTURE) ×6 IMPLANT
SYR 20ML LL LF (SYRINGE) ×3 IMPLANT
SYR 30ML LL (SYRINGE) ×6 IMPLANT
TIBIA MBT CEMENT (Knees) ×3 IMPLANT
TIP FAN IRRIG PULSAVAC PLUS (DISPOSABLE) ×1 IMPLANT
TOWER CARTRIDGE SMART MIX (DISPOSABLE) ×3 IMPLANT
TRAY FOLEY MTR SLVR 16FR STAT (SET/KITS/TRAYS/PACK) ×3 IMPLANT
TUBE SUCT KAM VAC (TUBING) ×3 IMPLANT
WRAPON POLAR PAD KNEE (MISCELLANEOUS) ×3

## 2019-11-05 NOTE — Progress Notes (Signed)
D: Pt alert and oriented. Pt denies experiencing any pain at this time. Pain well control with PRN medications. Pt has iceman, knee immobilizer, and foot pumps. Pt has been up with PT.  A: Scheduled medications administered to pt, per MD orders. Support and encouragement provided. Frequent verbal contact made.   R: No adverse drug reactions noted. Pt complaint with medications and treatment plan. Pt interacts well with staff on the unit. Pt is stable at this time, will continue to monitor and provide care for as ordered.

## 2019-11-05 NOTE — Op Note (Signed)
DATE OF SURGERY:  11/05/2019 TIME: 10:22 AM  PATIENT NAME:  Ann Spencer   AGE: 78 y.o.    PRE-OPERATIVE DIAGNOSIS:  M17.12 unilateral primary osteoarthritis left knee  POST-OPERATIVE DIAGNOSIS:  Same  PROCEDURE:  Procedure(s): LEFT TOTAL KNEE ARTHROPLASTY  SURGEON:  Thornton Park, MD   ASSISTANT:  Tessa Lerner, PA  OPERATIVE IMPLANTS: Depuy PFC Sigma, Posterior Stabilized Femural component size 3, Tibia size rotating platform component size 3, Patella polyethylene 3-peg oval button size 38, with a 10 mm polyethylene insert.  EBL:  50  TOURNIQUET TIME:  103 minutes  PREOPERATIVE INDICATIONS:  Ann Spencer is an 78 y.o. female who has a diagnosis of  M17.12 unilateral primary osteoarthritis left knee and elected for a left total knee arthroplasty after failing nonoperative treatment.  Their knee pain significantly impacts their activity of daily living.  Radiographs have demonstrated tricompartmental osteoarthritis joint space narrowing, osteophytes, and subchondral sclerosis.  The risks, benefits, and alternatives were discussed at length including but not limited to the risks of infection, bleeding, nerve or blood vessel injury, knee stiffness, fracture, dislocation, loosening or failure of the hardware and the need for further surgery. Medical risks include but not limited to DVT and pulmonary embolism, myocardial infarction, stroke, pneumonia, respiratory failure and death. I discussed these risks with the patient in my office prior to the date of surgery. They understood these risks and were willing to proceed.  OPERATIVE FINDINGS AND UNIQUE ASPECTS OF THE CASE: Advanced tricompartmental osteoarthritis  OPERATIVE DESCRIPTION:  The patient was brought to the operative room and placed in a supine position after undergoing placement of a spinal anesthetic.  A Foley catheter was placed.  IV antibiotics were given. Patient received vancomycin 1 g IV and clindamycin 600 mg  IV. The lower extremity was prepped and draped in the usual sterile fashion.  A time out was performed to verify the patient's name, date of birth, medical record number, correct site of surgery and correct procedure to be performed. The timeout was also used to confirm the patient received antibiotics and that appropriate instruments, implants and radiographs studies were available in the room.  The leg was elevated and exsanguinated with an Esmarch and the tourniquet was inflated to 275 mmHg for 103 minutes..  A midline incision was made over the left knee. Full-thickness skin flaps were developed. A medial parapatellar arthrotomy was then made and the patella everted and the knee was brought into 90 of flexion. Hoffa's fat pad along with the cruciate ligaments and medial and lateral menisci were resected.   The distal femoral intramedullary canal was opened with a drill and the intramedullary distal femoral cutting jig was inserted into the femoral canal pinned into position. It was set at 5 degrees resecting 10 mm off the distal femur.  Care was taken to protect the collateral ligaments during distal femoral resection.  The distal femoral resection was performed with an oscillating saw. The femoral cutting guide was then removed.  The extramedullary tibial cutting guide was then placed using the anterior tibial crest and second ray of the foot as a references.  The tibial cutting guide was adjusted to allow for appropriate posterior slope.  The tibial cutting block was pinned into position. The slotted stylus was used to measure the proximal tibial resection of 8 mm off the high medial side.  The tibial long rod alignment guide was then used to confirm position of the cutting block. A third cross pin through the tibial cutting  block was then drilled into position to allow for rotational stability. Care was taken during the tibial resection to protect the medial and collateral ligaments.  The resected  tibial bone was removed along with the posterior horns of the menisci.  The PCL was sacrificed.  Extension gap was measured with a spacer block and alignment and extension was confirmed using a long alignment rod.  The attention was then turned back to the femur. The posterior referencing distal femoral sizing guide was applied to the distal femur.  The femur was sized to be a size 3. Rotation of the referencing guide was checked with the epicondylar axis and Whitesides line. Then the 4-in-1 cutting jig was then applied to the distal femur. A stylus was used to confirm that the anterior femur would not be notched.   Then the anterior, posterior and chamfer femoral cuts were then made with an oscillating saw.  The flexion gap was then measured with a flexion spacer block and long alignment rod and was found to be symmetric with the extension gap and perpendicular to mechanical axis of the tibia.  The distal femoral preparation was completed by performing the posterior stabilized box cut using the cutting block. The entry site for the intramedullary femoral guide was filled with autologous bone graft from bone previously resected earlier in the case.  The proximal tibia plateau was then sized with trial trays. The best coverage was achieved with a size 3. This tibial tray was then pinned into position. The proximal tibia was then prepared with the reamer and keel punch.  After tibial preparation was completed, all trial components were inserted with polyethylene trials.  The knee was found to have excellent balance and full motion with a size 10 mm tibial polyethylene insert..    The attention was then turned to preparation of the patella. The thickness of the patella was measured with a caliper, the diameter measured with the patella templates.  The patella resection was then made with an oscillating saw using the patella cutting guide.  3 peg holes for the patella component were then drilled. The trial  patella was then placed. Knee was taken through a full range of motion and deemed to be stable with the trial components. All trial components were then removed. The knee capsule was then injected with Exparel.  The knee joint capsule was injected with a mixture of quarter percent Marcaine, Toradol and morphine to assist with postoperative pain relief.  The joint was copiously irrigated with pulse lavage.  The final total knee arthroplasty components were then cemented into place with a 10 mm trial polyethylene insert and all excess methylmethacrylate was removed.  The joint was again copiously irrigated. After the cement had hardened the knee was again taken through a full range of motion. It was felt to be most stable with the 10 mm tibial polyethylene insert. The actual tibial polyethylene insert was then placed.   The knee was taken through a range of motion and the patella tracked well and the knee was again irrigated copiously.    The medial arthrotomy was closed with #1 Ethibond. The subcutaneous tissue closed with 0 and 2-0 vicryl, and skin approximated with staples.  A dry sterile and compressive dressing was applied.  A Polar Care was applied to the operative knee along with a knee immobilizer.  The patient was awakened and brought to the PACU in stable and satisfactory condition.  All sharp, lap and instrument counts were correct at the  conclusion the case. I spoke with the patient's daughter in the postop consultation room to let her know the case had been performed without complication and the patient was stable in recovery room.

## 2019-11-05 NOTE — Plan of Care (Signed)
Pt sitting up in bed. No signs of infection. Pt tolerating activity well. No reports of n/v. IVF saline locked with saline flush added.

## 2019-11-05 NOTE — Anesthesia Post-op Follow-up Note (Signed)
Anesthesia QCDR form completed.        

## 2019-11-05 NOTE — Anesthesia Procedure Notes (Addendum)
Spinal  Patient location during procedure: OR Start time: 11/05/2019 7:45 AM End time: 11/05/2019 7:51 AM Staffing Anesthesiologist: Emmie Niemann, MD Resident/CRNA: Eben Burow, CRNA Performed: resident/CRNA  Preanesthetic Checklist Completed: patient identified, site marked, surgical consent, pre-op evaluation, timeout performed, IV checked, risks and benefits discussed and monitors and equipment checked Spinal Block Patient position: sitting Prep: ChloraPrep and site prepped and draped Patient monitoring: heart rate, continuous pulse ox and blood pressure Approach: midline Location: L3-4 Injection technique: single-shot Needle Needle type: Introducer and Pencan  Needle gauge: 24 G Needle length: 9 cm Assessment Sensory level: T4

## 2019-11-05 NOTE — Anesthesia Preprocedure Evaluation (Signed)
Anesthesia Evaluation  Patient identified by MRN, date of birth, ID band Patient awake    Reviewed: Allergy & Precautions, NPO status , Patient's Chart, lab work & pertinent test results  History of Anesthesia Complications Negative for: history of anesthetic complications  Airway Mallampati: II  TM Distance: >3 FB Neck ROM: Full    Dental  (+) Upper Dentures   Pulmonary neg sleep apnea, neg COPD, former smoker,    breath sounds clear to auscultation- rhonchi (-) wheezing      Cardiovascular hypertension, Pt. on medications (-) CAD, (-) Past MI, (-) Cardiac Stents and (-) CABG  Rhythm:Regular Rate:Normal - Systolic murmurs and - Diastolic murmurs    Neuro/Psych neg Seizures negative neurological ROS  negative psych ROS   GI/Hepatic Neg liver ROS, GERD  ,  Endo/Other  negative endocrine ROSneg diabetes  Renal/GU negative Renal ROS     Musculoskeletal  (+) Arthritis ,   Abdominal (+) + obese,   Peds  Hematology  (+) anemia ,   Anesthesia Other Findings Past Medical History: No date: Allergy No date: Anemia No date: Arthritis No date: GERD (gastroesophageal reflux disease) 11/17/2015: History of head injury No date: Hypertension 2014: Phlebitis 02/15/2018: Pneumonia due to infectious organism 12/2013: Rotator cuff injury   Reproductive/Obstetrics                             Lab Results  Component Value Date   WBC 3.4 (L) 10/28/2019   HGB 15.1 (H) 10/28/2019   HCT 43.9 10/28/2019   MCV 96.3 10/28/2019   PLT 184 10/28/2019    Anesthesia Physical Anesthesia Plan  ASA: II  Anesthesia Plan: Spinal   Post-op Pain Management:    Induction:   PONV Risk Score and Plan: 2 and Propofol infusion  Airway Management Planned: Natural Airway  Additional Equipment:   Intra-op Plan:   Post-operative Plan:   Informed Consent: I have reviewed the patients History and Physical, chart,  labs and discussed the procedure including the risks, benefits and alternatives for the proposed anesthesia with the patient or authorized representative who has indicated his/her understanding and acceptance.     Dental advisory given  Plan Discussed with: CRNA and Anesthesiologist  Anesthesia Plan Comments:         Anesthesia Quick Evaluation

## 2019-11-05 NOTE — Evaluation (Signed)
Physical Therapy Evaluation Patient Details Name: Ann Spencer MRN: [id number] DOB: 1941-07-30 Today's Date: 11/05/2019   History of Present Illness  Pt admitted for L TKR. History includes GERD., HTN, and pneumonia.  Clinical Impression  Pt is a pleasant 78 year old female who was admitted for L TKR. Pt performs bed mobility/transfers with min assist and ambulation with cga and RW. Pt demonstrates ability to perform 10 SLRs with independence, therefore does not require KI for mobility. Pt demonstrates deficits with strength/mobility/pain. Pt is very motivated to perform therapy. Would benefit from skilled PT to address above deficits and promote optimal return to PLOF. Recommend transition to [facility] upon discharge from acute hospitalization.     Follow Up Recommendations Home health PT    Equipment Recommendations  Rolling walker with 5" wheels;3in1 (PT)    Recommendations for Other Services       Precautions / Restrictions Precautions Precautions: Fall;Knee Precaution Booklet Issued: No Restrictions Weight Bearing Restrictions: Yes LLE Weight Bearing: Weight bearing as tolerated Other Position/Activity Restrictions: may take KI off during therapy      Mobility  Bed Mobility Overal bed mobility: Needs Assistance Bed Mobility: Supine to Sit     Supine to sit: Min assist     General bed mobility comments: needs cues for hand placement and scooting out towards EOB. Once seated, upright posture noted. Safe technique  Transfers Overall transfer level: Needs assistance Equipment used: Rolling walker (2 wheeled) Transfers: Sit to/from Stand Sit to Stand: Min assist         General transfer comment: safe technique with cues for hand placement. ONce standing, pt demonstrates good WBing on L LE without buckling.  Ambulation/Gait Ambulation/Gait assistance: Min guard Gait Distance (Feet): 15 Feet Assistive device: Rolling walker (2 wheeled) Gait Pattern/deviations:  Step-to pattern     General Gait Details: ambulated with short step to gait pattern and RW used. Safe technique.   Stairs            Wheelchair Mobility    Modified Rankin (Stroke Patients Only)       Balance Overall balance assessment: History of Falls;Needs assistance Sitting-balance support: Feet supported Sitting balance-Leahy Scale: Good     Standing balance support: Bilateral upper extremity supported Standing balance-Leahy Scale: Good                               Pertinent Vitals/Pain Pain Assessment: Faces Faces Pain Scale: Hurts a little bit Pain Location: L knee Pain Descriptors / Indicators: Operative site guarding Pain Intervention(s): Limited activity within patient's tolerance;Ice applied    Home Living Family/patient expects to be discharged to:: Private residence Living Arrangements: Children Available Help at Discharge: Family;Available 24 hours/day Type of Home: House Home Access: Stairs to enter Entrance Stairs-Rails: Right Entrance Stairs-Number of Steps: 3 Home Layout: One level Home Equipment: Cane - single point      Prior Function Level of Independence: Independent with assistive device(s)         Comments: was ambulatory using SPC, reports history of falls     Hand Dominance        Extremity/Trunk Assessment   Upper Extremity Assessment Upper Extremity Assessment: Generalized weakness(B UE grossly 4/5)    Lower Extremity Assessment Lower Extremity Assessment: Generalized weakness(L LE grossly 3-/5; R LE grossly 3/5)       Communication   Communication: No difficulties  Cognition Arousal/Alertness: Awake/alert Behavior During Therapy: WFL for tasks assessed/performed  Overall Cognitive Status: Within Functional Limits for tasks assessed                                        General Comments      Exercises Total Joint Exercises Goniometric ROM: L knee AAROM: 2-70 degrees Other  Exercises Other Exercises: supine ther-ex performed on L LE including AP, quad sets, SLRs, and hip abd/add. All ther-ex performed x 10 reps with min/mod assist.   Assessment/Plan    PT Assessment Patient needs continued PT services  PT Problem List Decreased strength;Decreased activity tolerance;Decreased balance;Decreased mobility;Pain       PT Treatment Interventions Gait training;DME instruction;Stair training;Therapeutic exercise;Balance training    PT Goals (Current goals can be found in the Care Plan section)  Acute Rehab PT Goals Patient Stated Goal: to go home PT Goal Formulation: With patient Time For Goal Achievement: 11/19/19 Potential to Achieve Goals: Good    Frequency BID   Barriers to discharge        Co-evaluation               AM-PAC PT "6 Clicks" Mobility  Outcome Measure Help needed turning from your back to your side while in a flat bed without using bedrails?: A Little Help needed moving from lying on your back to sitting on the side of a flat bed without using bedrails?: A Little Help needed moving to and from a bed to a chair (including a wheelchair)?: A Little Help needed standing up from a chair using your arms (e.g., wheelchair or bedside chair)?: A Little Help needed to walk in hospital room?: A Little Help needed climbing 3-5 steps with a railing? : A Lot 6 Click Score: 17    End of Session Equipment Utilized During Treatment: Gait belt Activity Tolerance: Patient tolerated treatment well Patient left: in bed(with RN) Nurse Communication: Mobility status PT Visit Diagnosis: Muscle weakness (generalized) (M62.81);Difficulty in walking, not elsewhere classified (R26.2);Pain Pain - Right/Left: Left Pain - part of body: Knee    Time: [id number] PT Time Calculation (min) (ACUTE ONLY): 40 min   Charges:   PT Evaluation $PT Eval Low Complexity: 1 Low PT Treatments $Therapeutic Exercise: 8-22 mins $Therapeutic Activity: 8-22 mins         Greggory Stallion, PT, DPT [phone]   Ann Spencer 11/05/2019, 4:26 PM

## 2019-11-05 NOTE — Transfer of Care (Signed)
Immediate Anesthesia Transfer of Care Note  Patient: Ann Spencer [facility]  Procedure(s) Performed: TOTAL KNEE ARTHROPLASTY (Left Knee)  Patient Location: PACU  Anesthesia Type:Spinal  Level of Consciousness: awake, alert , oriented and patient cooperative  Airway & Oxygen Therapy: Patient Spontanous Breathing  Post-op Assessment: Report given to RN and Post -op Vital signs reviewed and stable  Post vital signs: Reviewed and stable  Last Vitals:  Vitals Value Taken Time  BP 94/51 11/05/19 1019  Temp    Pulse 86 11/05/19 1020  Resp 19 11/05/19 1020  SpO2 92 % 11/05/19 1020  Vitals shown include unvalidated device data.  Last Pain:  Vitals:   11/05/19 0627  TempSrc: Tympanic  PainSc: 0-No pain         Complications: No apparent anesthesia complications

## 2019-11-05 NOTE — H&P (Signed)
PREOPERATIVE H&P  Chief Complaint: M17.12 unilateral primary osteoarthritis left knee  HPI: Ann Spencer is a 78 y.o. female who presents for preoperative history and physical with a diagnosis of M17.12 unilateral primary osteoarthritis left knee. Symptoms of pain, limited ROM and effusions are significantly impairing activities of daily living.  Patient has x-rays which reveal significant joint space narrowing, subchondral sclerosis, and marginal osteophytes.  Patient has failed nonoperative management and has agreed to proceed with a left total knee arthroplasty.   Past Medical History:  Diagnosis Date  . Allergy   . Anemia   . Arthritis   . GERD (gastroesophageal reflux disease)   . History of head injury 11/17/2015  . Hypertension   . Phlebitis 2014  . Pneumonia due to infectious organism 02/15/2018  . Rotator cuff injury 12/2013   Past Surgical History:  Procedure Laterality Date  . ABDOMINAL HYSTERECTOMY  1981   Full  . COLONOSCOPY    . SHOULDER SURGERY Left 03/03/15  . vein closure Bilateral Feb 2008   Social History   Socioeconomic History  . Marital status: Widowed    Spouse name: Not on file  . Number of children: Not on file  . Years of education: Not on file  . Highest education level: Not on file  Occupational History  . Not on file  Social Needs  . Financial resource strain: Not on file  . Food insecurity    Worry: Not on file    Inability: Not on file  . Transportation needs    Medical: Not on file    Non-medical: Not on file  Tobacco Use  . Smoking status: Former Smoker    Packs/day: 1.00    Years: 30.00    Pack years: 30.00    Types: Cigarettes    Quit date: 12/12/1993    Years since quitting: 25.9  . Smokeless tobacco: Never Used  Substance and Sexual Activity  . Alcohol use: Yes    Alcohol/week: 7.0 standard drinks    Types: 7 Standard drinks or equivalent per week    Comment: Wine qhs  . Drug use: No  . Sexual activity: Never  Lifestyle  .  Physical activity    Days per week: Not on file    Minutes per session: Not on file  . Stress: Not on file  Relationships  . Social Herbalist on phone: Not on file    Gets together: Not on file    Attends religious service: Not on file    Active member of club or organization: Not on file    Attends meetings of clubs or organizations: Not on file    Relationship status: Not on file  Other Topics Concern  . Not on file  Social History Narrative   [place]   Lives with daughter and grandaughter    2 dogs lives inside   Enjoys reading   Family History  Problem Relation Age of Onset  . Varicose Veins Brother    Allergies  Allergen Reactions  . Penicillins     Did it involve swelling of the face/tongue/throat, SOB, or low BP? Unknown Did it involve sudden or severe rash/hives, skin peeling, or any reaction on the inside of your mouth or nose? Unknown Did you need to seek medical attention at a hospital or doctor's office? Unknown When did it last happen? Childhood allergy If all above answers are "NO", may proceed with cephalosporin use.    Prior to Admission  medications   Medication Sig Start Date End Date Taking? Authorizing Provider  aspirin 81 MG tablet Take 81 mg by mouth daily.   Yes [provider]  Cholecalciferol (D3 ADULT) 25 MCG (1000 UT) CHEW Chew 1,000 Units by mouth daily.    Yes [provider]  ferrous sulfate 325 (65 FE) MG EC tablet Take 325 mg by mouth daily with breakfast.   Yes [provider]  hydrochlorothiazide (HYDRODIURIL) 25 MG tablet Take 1 tablet (25 mg total) by mouth daily. 05/01/19  Yes Leone Haven, MD  Misc Natural Products (OSTEO BI-FLEX JOINT SHIELD PO) Take 1 tablet by mouth daily.    Yes [provider]  Multiple Vitamin (MULTIVITAMIN) capsule Take 1 capsule by mouth daily.   Yes [provider]  omeprazole (PRILOSEC) 20 MG capsule Take 20 mg by mouth as needed.    Yes [provider]     Positive ROS: All other systems have been reviewed and were otherwise negative with the exception of those mentioned in the HPI and as above.  Physical Exam: General: Alert, no acute distress Cardiovascular: Regular rate and rhythm, no murmurs rubs or gallops.  No pedal edema Respiratory: Clear to auscultation bilaterally, no wheezes rales or rhonchi. No cyanosis, no use of accessory musculature GI: No organomegaly, abdomen is soft and non-tender nondistended with positive bowel sounds. Skin: Skin intact, no lesions within the operative field. Neurologic: Sensation intact distally Psychiatric: Patient is competent for consent with normal mood and affect Lymphatic: No cervical lymphadenopathy  MUSCULOSKELETAL: Left knee: Patient skin is intact.  Range of motion is from near full extension to 110 to 115 degrees.  Patient has no ligamentous laxity.  She has tenderness of the medial joint line.  She is patellofemoral crepitus mild grind but no apprehension.  Patient is has palpable pedal pulses, intact sensation light touch intact motor function.  Assessment: M17.12 unilateral primary osteoarthritis left knee  Plan: Plan for Procedure(s): LEFT TOTAL KNEE ARTHROPLASTY  I reviewed the details of the operation as well as the postoperative course.  I discussed the risks and benefits of surgery. The risks include but are not limited to infection, bleeding requiring blood transfusion, nerve or blood vessel injury, joint stiffness or loss of motion, persistent pain, weakness or instability,  and hardware failure and the need for further surgery. Medical risks include but are not limited to DVT and pulmonary embolism, myocardial infarction, stroke, pneumonia, respiratory failure and death. Patient understood these risks and wished to proceed.     Thornton Park, MD   11/05/2019 7:36 AM

## 2019-11-06 ENCOUNTER — Encounter: Payer: Self-pay | Admitting: Orthopedic Surgery

## 2019-11-06 LAB — BASIC METABOLIC PANEL
Anion gap: 10 (ref 5–15)
BUN: 27 mg/dL — ABNORMAL HIGH (ref 8–23)
CO2: 28 mmol/L (ref 22–32)
Calcium: 9.1 mg/dL (ref 8.9–10.3)
Chloride: 97 mmol/L — ABNORMAL LOW (ref 98–111)
Creatinine, Ser: 0.88 mg/dL (ref 0.44–1.00)
GFR calc Af Amer: >60 mL/min (ref >60)
GFR calc non Af Amer: >60 mL/min (ref >60)
Glucose, Bld: 135 mg/dL — ABNORMAL HIGH (ref 70–99)
Potassium: 4.5 mmol/L (ref 3.5–5.1)
Sodium: 135 mmol/L (ref 135–145)

## 2019-11-06 LAB — CBC
HCT: 37.8 % (ref 36.0–46.0)
Hemoglobin: 13 g/dL (ref 12.0–15.0)
MCH: 33.2 pg (ref 26.0–34.0)
MCHC: 34.4 g/dL (ref 30.0–36.0)
MCV: 96.4 fL (ref 80.0–100.0)
Platelets: 157 10*3/uL (ref 150–400)
RBC: 3.92 MIL/uL (ref 3.87–5.11)
RDW: 11.9 % (ref 11.5–15.5)
WBC: 7.3 10*3/uL (ref 4.0–10.5)
nRBC: 0 % (ref 0.0–0.2)

## 2019-11-06 NOTE — Progress Notes (Signed)
Physical Therapy Treatment Patient Details Name: Ann Spencer MRN: [id number] DOB: [birth year] Today's Date: 11/06/2019    History of Present Illness Pt admitted for L TKR. History includes GERD., HTN, and pneumonia.    PT Comments    Pt is making good progress towards goals with ability to ambulate in hallway using RW. Robe donned prior to ambulation. Written HEP given and reviewed. Good endurance with HEP and ROM. Pt remains very motivated to perform therapy. Will continue to progress as able.   Follow Up Recommendations  Home health PT     Equipment Recommendations  Rolling walker with 5" wheels;3in1 (PT)    Recommendations for Other Services       Precautions / Restrictions Precautions Precautions: Fall;Knee Precaution Booklet Issued: Yes (comment) Required Braces or Orthoses: Knee Immobilizer - Left Knee Immobilizer - Left: On when out of bed or walking Restrictions Weight Bearing Restrictions: Yes LLE Weight Bearing: Weight bearing as tolerated Other Position/Activity Restrictions: may take KI off during therapy    Mobility  Bed Mobility Overal bed mobility: Needs Assistance Bed Mobility: Supine to Sit     Supine to sit: Min guard     General bed mobility comments: improved, ease of transfer with guidance required for lowering surgical leg to floor  Transfers Overall transfer level: Needs assistance Equipment used: Rolling walker (2 wheeled) Transfers: Sit to/from Stand Sit to Stand: Min guard         General transfer comment: cues for hand placement. Upright posture noted  Ambulation/Gait Ambulation/Gait assistance: Min guard Gait Distance (Feet): 120 Feet Assistive device: Rolling walker (2 wheeled) Gait Pattern/deviations: Step-to pattern     General Gait Details: ambulated in hallway with cues for RW sequencing including increased L stance time and upright posture. Fatigues with increased distance. Reminders for pursed lip  breathing.   Stairs             Wheelchair Mobility    Modified Rankin (Stroke Patients Only)       Balance Overall balance assessment: History of Falls;Needs assistance Sitting-balance support: Feet supported Sitting balance-Leahy Scale: Good     Standing balance support: Bilateral upper extremity supported Standing balance-Leahy Scale: Good                              Cognition Arousal/Alertness: Awake/alert Behavior During Therapy: WFL for tasks assessed/performed Overall Cognitive Status: Within Functional Limits for tasks assessed                                        Exercises Total Joint Exercises Goniometric ROM: L Knee AAROM: 0-79 degrees Other Exercises Other Exercises: seated ther-ex performed on L LE including AP, quad sets, SLRs, and hip abd/add. All ther-ex performed x 12 reps with min assist. Written HEP given and reviewed Other Exercises: Pt instructed in AE/DME for ADL, home/routines modifications, BSC use over toilet, RW mgt in bathroom vs kitchen, polar care mgt, and immobilizer mgt; handout provided Other Exercises: ambulated to bathroom with cga. Slightly impulsive due to urgency. Needs min assist for low rise toilet    General Comments        Pertinent Vitals/Pain Pain Assessment: 0-10 Pain Score: 4  Pain Location: L knee Pain Descriptors / Indicators: Operative site guarding;Aching Pain Intervention(s): Limited activity within patient's tolerance;Ice applied;Premedicated before session    Home Living Family/patient  expects to be discharged to:: Private residence Living Arrangements: Children(daughter and 7yo great granddaughter) Available Help at Discharge: Family;Available 24 hours/day Type of Home: House Home Access: Stairs to enter Entrance Stairs-Rails: Right Home Layout: One level Home Equipment: Cane - single point;Shower seat      Prior Function Level of Independence: Independent with  assistive device(s)      Comments: was ambulatory using SPC, reports history of falls   PT Goals (current goals can now be found in the care plan section) Acute Rehab PT Goals Patient Stated Goal: to go home PT Goal Formulation: With patient Time For Goal Achievement: 11/19/19 Potential to Achieve Goals: Good Progress towards PT goals: Progressing toward goals    Frequency    BID      PT Plan Current plan remains appropriate    Co-evaluation              AM-PAC PT "6 Clicks" Mobility   Outcome Measure  Help needed turning from your back to your side while in a flat bed without using bedrails?: A Little Help needed moving from lying on your back to sitting on the side of a flat bed without using bedrails?: A Little Help needed moving to and from a bed to a chair (including a wheelchair)?: A Little Help needed standing up from a chair using your arms (e.g., wheelchair or bedside chair)?: A Little Help needed to walk in hospital room?: A Little Help needed climbing 3-5 steps with a railing? : A Lot 6 Click Score: 17    End of Session Equipment Utilized During Treatment: Gait belt Activity Tolerance: Patient tolerated treatment well Patient left: in chair;with chair alarm set Nurse Communication: Mobility status PT Visit Diagnosis: Muscle weakness (generalized) (M62.81);Difficulty in walking, not elsewhere classified (R26.2);Pain Pain - Right/Left: Left Pain - part of body: Knee     Time: [id number] PT Time Calculation (min) (ACUTE ONLY): 31 min  Charges:  $Gait Training: 8-22 mins $Therapeutic Exercise: 8-22 mins                     Greggory Stallion, PT, DPT [phone]    Ann Spencer 11/06/2019, 11:02 AM

## 2019-11-06 NOTE — Progress Notes (Signed)
Physical Therapy Treatment Patient Details Name: Ann Spencer MRN: [id number] DOB: 1941/01/05 Today's Date: 11/06/2019    History of Present Illness Pt admitted for L TKR. History includes GERD., HTN, and pneumonia.    PT Comments    Pt is making good progress towards goals. Pt able to ambulated in hallway with good endurance. Making progress with there-ex. Pt needs continued education about pain management and moderating activity. Encouraged to get up once/hour to reduce stiffness. Will continue to progress. Needs stair training tomorrow prior to dc.   Follow Up Recommendations  Home health PT     Equipment Recommendations  Rolling walker with 5" wheels;3in1 (PT)    Recommendations for Other Services       Precautions / Restrictions Precautions Precautions: Fall;Knee Precaution Booklet Issued: Yes (comment) Required Braces or Orthoses: Knee Immobilizer - Left Knee Immobilizer - Left: On when out of bed or walking Restrictions Weight Bearing Restrictions: Yes LLE Weight Bearing: Weight bearing as tolerated Other Position/Activity Restrictions: may take KI off during therapy    Mobility  Bed Mobility Overal bed mobility: Needs Assistance Bed Mobility: Sit to Supine     Supine to sit: Min guard Sit to supine: Min assist   General bed mobility comments: needs assist to bring B LEs onto bed. Needs min assist for positioning  Transfers Overall transfer level: Needs assistance Equipment used: Rolling walker (2 wheeled) Transfers: Sit to/from Stand Sit to Stand: Min guard         General transfer comment: cues for hand placement. Upright posture noted  Ambulation/Gait Ambulation/Gait assistance: Min guard Gait Distance (Feet): 200 Feet Assistive device: Rolling walker (2 wheeled) Gait Pattern/deviations: Step-to pattern     General Gait Details: ambulated with safe technique, fatigues with increased distance.   Stairs             Wheelchair  Mobility    Modified Rankin (Stroke Patients Only)       Balance Overall balance assessment: History of Falls;Needs assistance Sitting-balance support: Feet supported Sitting balance-Leahy Scale: Good     Standing balance support: Bilateral upper extremity supported Standing balance-Leahy Scale: Good                              Cognition Arousal/Alertness: Awake/alert Behavior During Therapy: WFL for tasks assessed/performed Overall Cognitive Status: Within Functional Limits for tasks assessed                                        Exercises Total Joint Exercises Goniometric ROM: L Knee AAROM: 0-79 degrees Other Exercises Other Exercises: seated ther-ex performed on L LE including AP, quad sets, SAQ, and hip abd/add. All ther-ex performed x 12 reps with min assist. Written HEP given and reviewed Other Exercises: Pt instructed in AE/DME for ADL, home/routines modifications, BSC use over toilet, RW mgt in bathroom vs kitchen, polar care mgt, and immobilizer mgt; handout provided Other Exercises: ambulated to bathroom with cga. Slightly impulsive due to urgency. Needs min assist for low rise toilet    General Comments        Pertinent Vitals/Pain Pain Assessment: Faces Pain Score: 4  Faces Pain Scale: Hurts even more Pain Location: L knee Pain Descriptors / Indicators: Operative site guarding;Aching Pain Intervention(s): Limited activity within patient's tolerance;Premedicated before session    Home Living  Prior Function            PT Goals (current goals can now be found in the care plan section) Acute Rehab PT Goals Patient Stated Goal: to go home PT Goal Formulation: With patient Time For Goal Achievement: 11/19/19 Potential to Achieve Goals: Good Progress towards PT goals: Progressing toward goals    Frequency    BID      PT Plan Current plan remains appropriate    Co-evaluation               AM-PAC PT "6 Clicks" Mobility   Outcome Measure  Help needed turning from your back to your side while in a flat bed without using bedrails?: A Little Help needed moving from lying on your back to sitting on the side of a flat bed without using bedrails?: A Little Help needed moving to and from a bed to a chair (including a wheelchair)?: A Little Help needed standing up from a chair using your arms (e.g., wheelchair or bedside chair)?: A Little Help needed to walk in hospital room?: A Little Help needed climbing 3-5 steps with a railing? : A Lot 6 Click Score: 17    End of Session Equipment Utilized During Treatment: Gait belt Activity Tolerance: Patient tolerated treatment well Patient left: in bed;with bed alarm set;with SCD's reapplied Nurse Communication: Mobility status PT Visit Diagnosis: Muscle weakness (generalized) (M62.81);Difficulty in walking, not elsewhere classified (R26.2);Pain Pain - Right/Left: Left Pain - part of body: Knee     Time: [id number] PT Time Calculation (min) (ACUTE ONLY): 38 min  Charges:  $Gait Training: 8-22 mins $Therapeutic Exercise: 8-22 mins $Therapeutic Activity: 8-22 mins                     Ann Spencer, PT, DPT [phone]    Ann Spencer 11/06/2019, 2:38 PM

## 2019-11-06 NOTE — Evaluation (Signed)
Occupational Therapy Evaluation Patient Details Name: Ann Spencer MRN: [id number] DOB: [birth year] Today's Date: 11/06/2019    History of Present Illness Pt admitted for L TKR. History includes GERD., HTN, and pneumonia.   Clinical Impression   Pt seen for OT evaluation this date, POD#1 from above surgery. Pt was independent in all ADL prior to surgery, however using SPC 2/2 L knee pain. Pt is eager to return to PLOF with less pain and improved safety and independence. Pt currently requires minimal assist for LB dressing and bathing while in seated position due to pain and limited AROM of L knee. Pt instructed in polar care mgt, falls prevention strategies, home/routines modifications, DME/AE for LB bathing and dressing tasks, and immobilizer mgt. Handout provided to support recall and carryover. Pt would benefit from skilled OT services including additional instruction in dressing techniques with or without assistive devices for dressing and bathing skills prior to discharge to maximize safety, independence, and minimize falls risk and caregiver burden. Do not currently anticipate any OT needs following this hospitalization.      Follow Up Recommendations  No OT follow up    Equipment Recommendations  3 in 1 bedside commode;Other (comment)(reacher)    Recommendations for Other Services       Precautions / Restrictions Precautions Precautions: Fall;Knee Precaution Booklet Issued: Yes (comment) Required Braces or Orthoses: Knee Immobilizer - Left Knee Immobilizer - Left: On at all times Restrictions Weight Bearing Restrictions: Yes LLE Weight Bearing: Weight bearing as tolerated Other Position/Activity Restrictions: may take KI off during therapy      Mobility Bed Mobility               General bed mobility comments: deferred, up in recliner  Transfers Overall transfer level: Needs assistance Equipment used: Rolling walker (2 wheeled) Transfers: Sit to/from  Stand Sit to Stand: Min guard              Balance Overall balance assessment: History of Falls;Needs assistance Sitting-balance support: Feet supported Sitting balance-Leahy Scale: Good     Standing balance support: Bilateral upper extremity supported Standing balance-Leahy Scale: Good                             ADL either performed or assessed with clinical judgement   ADL Overall ADL's : Needs assistance/impaired                                       General ADL Comments: Min A for LB ADL, CGA for toilet transfers     Vision Baseline Vision/History: Wears glasses Wears Glasses: At all times Patient Visual Report: No change from baseline       Perception     Praxis      Pertinent Vitals/Pain Pain Assessment: 0-10 Pain Score: 7  Pain Location: L knee Pain Descriptors / Indicators: Operative site guarding;Aching Pain Intervention(s): Limited activity within patient's tolerance;Monitored during session;Patient requesting pain meds-RN notified;RN gave pain meds during session;Premedicated before session;Ice applied     Hand Dominance Right   Extremity/Trunk Assessment Upper Extremity Assessment Upper Extremity Assessment: Overall WFL for tasks assessed   Lower Extremity Assessment Lower Extremity Assessment: Defer to PT evaluation;Generalized weakness(L LE grossly 3-/5; R LE grossly 3/5)       Communication Communication Communication: No difficulties   Cognition Arousal/Alertness: Awake/alert Behavior During Therapy: WFL for tasks  assessed/performed Overall Cognitive Status: Within Functional Limits for tasks assessed                                     General Comments       Exercises Other Exercises Other Exercises: Pt instructed in AE/DME for ADL, home/routines modifications, BSC use over toilet, RW mgt in bathroom vs kitchen, polar care mgt, and immobilizer mgt; handout provided   Shoulder  Instructions      Home Living Family/patient expects to be discharged to:: Private residence Living Arrangements: Children(daughter and 7yo great granddaughter) Available Help at Discharge: Family;Available 24 hours/day Type of Home: House Home Access: Stairs to enter [employer] of Steps: 3 Entrance Stairs-Rails: Right Home Layout: One level     Bathroom Shower/Tub: Walk-in shower;Tub/shower unit(normally uses tub/shower, walk-in shower available)   Bathroom Toilet: Standard     Home Equipment: Cane - single point;Shower seat          Prior Functioning/Environment Level of Independence: Independent with assistive device(s)        Comments: was ambulatory using SPC, reports history of falls        OT Problem List: Decreased strength;Pain;Decreased range of motion;Impaired balance (sitting and/or standing);Decreased knowledge of use of DME or AE      OT Treatment/Interventions: Self-care/ADL training;Therapeutic exercise;Therapeutic activities;DME and/or AE instruction;Patient/family education;Balance training    OT Goals(Current goals can be found in the care plan section) Acute Rehab OT Goals Patient Stated Goal: to go home OT Goal Formulation: With patient Time For Goal Achievement: 11/20/19 Potential to Achieve Goals: Good ADL Goals Pt Will Perform Lower Body Dressing: sit to/from stand;with adaptive equipment;with modified independence Pt Will Transfer to Toilet: with supervision;ambulating(BSC over toilet, LRAD for amb) Additional ADL Goal #1: Pt will independently instruct family/caregiver in knee immobilizer mgt Additional ADL Goal #2: Pt will independently instruct family/caregiver in polar care mgt  OT Frequency: Min 1X/week   Barriers to D/C:            Co-evaluation              AM-PAC OT "6 Clicks" Daily Activity     Outcome Measure Help from another person eating meals?: None Help from another person taking care of personal  grooming?: None Help from another person toileting, which includes using toliet, bedpan, or urinal?: A Little Help from another person bathing (including washing, rinsing, drying)?: A Little Help from another person to put on and taking off regular upper body clothing?: None Help from another person to put on and taking off regular lower body clothing?: A Little 6 Click Score: 21   End of Session Nurse Communication: Patient requests pain meds  Activity Tolerance: Patient tolerated treatment well Patient left: in chair;with call bell/phone within reach;with chair alarm set;Other (comment)(immobilizer, polar care in place)  OT Visit Diagnosis: Other abnormalities of gait and mobility (R26.89);History of falling (Z91.81);Muscle weakness (generalized) (M62.81);Pain Pain - Right/Left: Left Pain - part of body: Knee                Time: [id number] OT Time Calculation (min): 30 min Charges:  OT General Charges $OT Visit: 1 Visit OT Evaluation $OT Eval Low Complexity: 1 Low OT Treatments $Self Care/Home Management : 8-22 mins $Therapeutic Activity: 8-22 mins  Jeni Salles, MPH, MS, OTR/L ascom [phone] 11/06/19, 10:48 AM

## 2019-11-06 NOTE — Progress Notes (Signed)
Foley removed. pdowless,rn

## 2019-11-06 NOTE — Progress Notes (Signed)
  Subjective:  POD #1 s/p left total knee arthroplasty.   Patient reports left knee pain as mild to moderate.  Patient denies nausea vomiting, shortness of breath or chest pain.  Patient has urinated but not yet had a bowel movement.  Patient is using incentive spirometry.  She walked around the nurses station this morning with physical therapy.  Patient's daughter is at the bedside.  Objective:   VITALS:   Vitals:   11/06/19 0421 11/06/19 0600 11/06/19 0817 11/06/19 1035  BP: 122/73  140/61   Pulse: 70  72 86  Resp:      Temp: 97.8 F (36.6 C)  (!) 97.4 F (36.3 C)   TempSrc: Oral  Oral   SpO2: 90%  96% 92%  Weight: 114.8 kg 113.9 kg    Height:        PHYSICAL EXAM: Left lower extremity Neurovascular intact Sensation intact distally Intact pulses distally Dorsiflexion/Plantar flexion intact Incision: dressing C/D/I No cellulitis present Compartment soft  LABS  Results for orders placed or performed during the hospital encounter of 11/05/19 (from the past 24 hour(s))  CBC     Status: None   Collection Time: 11/06/19  4:55 AM  Result Value Ref Range   WBC 7.3 4.0 - 10.5 K/uL   RBC 3.92 3.87 - 5.11 MIL/uL   Hemoglobin 13.0 12.0 - 15.0 g/dL   HCT 37.8 36.0 - 46.0 %   MCV 96.4 80.0 - 100.0 fL   MCH 33.2 26.0 - 34.0 pg   MCHC 34.4 30.0 - 36.0 g/dL   RDW 11.9 11.5 - 15.5 %   Platelets 157 150 - 400 K/uL   nRBC 0.0 0.0 - 0.2 %  Basic metabolic panel     Status: Abnormal   Collection Time: 11/06/19  4:55 AM  Result Value Ref Range   Sodium 135 135 - 145 mmol/L   Potassium 4.5 3.5 - 5.1 mmol/L   Chloride 97 (L) 98 - 111 mmol/L   CO2 28 22 - 32 mmol/L   Glucose, Bld 135 (H) 70 - 99 mg/dL   BUN 27 (H) 8 - 23 mg/dL   Creatinine, Ser 0.88 0.44 - 1.00 mg/dL   Calcium 9.1 8.9 - 10.3 mg/dL   GFR calc non Af Amer >60 >60 mL/min   GFR calc Af Amer >60 >60 mL/min   Anion gap 10 5 - 15    Dg Knee Left Port  Result Date: 11/05/2019 CLINICAL DATA:  Knee replacement EXAM:  PORTABLE LEFT KNEE - 1-2 VIEW COMPARISON:  None FINDINGS: Post total left knee arthroplasty. Postoperative air and soft tissue swelling present. No evidence of complication. IMPRESSION: Standard appearance post total left knee arthroplasty. Electronically Signed   By: Macy Mis M.D.   On: 11/05/2019 11:11    Assessment/Plan: 1 Day Post-Op   Active Problems:   S/P TKR (total knee replacement) using cement, left  Patient doing well postop day #1.  Patient is making good progress of physical therapy.  Dressing will be changed tomorrow.  Foley catheter is out.  Hemoglobin hematocrit been stable.  Patient has received Lovenox today for DVT prophylaxis.    Thornton Park , MD 11/06/2019, 1:59 PM

## 2019-11-06 NOTE — Anesthesia Postprocedure Evaluation (Signed)
Anesthesia Post Note  Patient: Ann Spencer  Procedure(s) Performed: TOTAL KNEE ARTHROPLASTY (Left Knee)  Patient location during evaluation: Nursing Unit Anesthesia Type: Spinal Level of consciousness: oriented and awake and alert Pain management: pain level controlled Vital Signs Assessment: post-procedure vital signs reviewed and stable Respiratory status: spontaneous breathing, respiratory function stable and patient connected to nasal cannula oxygen Cardiovascular status: blood pressure returned to baseline and stable Postop Assessment: no headache, no backache and no apparent nausea or vomiting Anesthetic complications: no     Last Vitals:  Vitals:   11/06/19 0817 11/06/19 1035  BP: 140/61   Pulse: 72 86  Resp:    Temp: (!) 36.3 C   SpO2: 96% 92%    Last Pain:  Vitals:   11/06/19 0817  TempSrc: Oral  PainSc:                  Estill Batten

## 2019-11-06 NOTE — TOC Initial Note (Signed)
Transition of Care [facility]) - Initial/Assessment Note    Patient Details  Name: Ann Spencer MRN: [id number] Date of Birth: [birth year]  [facility]) CM/SW Contact:    Su Hilt, RN Phone Number: 11/06/2019, 12:16 PM  Clinical Narrative:                 Met with the patient to discuss DC plan and needs She lives at home with adult kids She has a cane at home but needs a RW and BSC, I notified Brad with ada[pt [facility] was set up prior to admission by the orthopedist office for [facility] services She has transportation with her family, she can afford her medications Expected Discharge Plan: [place] Barriers to Discharge: Barriers Resolved   Patient Goals and CMS Choice Patient states their goals for this hospitalization and ongoing recovery are:: go home CMS Medicare.gov Compare Post Acute Care list provided to:: Patient Choice offered to / list presented to : Patient  Expected Discharge Plan and Services Expected Discharge Plan: [place]   Discharge Planning Services: CM Consult   Living arrangements for the past 2 months: Single Family Home                 DME Arranged: 3-N-1, Walker rolling DME Agency: [employer] Date DME Agency Contacted: 11/06/19 Time DME Agency Contacted: 1215 Representative spoke with at DME Agency: [place]: [place]: [facility] [place] Date [place]: 11/06/19 Time [place]: 1215 Representative spoke with at [place]: [place] Arrangements/Services Living arrangements for the past 2 months: [place] with:: Adult Children Patient language and need for interpreter reviewed:: No Do you feel safe going back to the place where you live?: Yes      Need for Family Participation in Patient Care: No (Comment) Care giver support system in place?: Yes (comment) Current home services: DME(cane) Criminal Activity/Legal Involvement Pertinent  to Current Situation/Hospitalization: No - Comment as needed  Activities of Daily Living Home Assistive Devices/Equipment: Cane (specify quad or straight)(quad) ADL Screening (condition at time of admission) Patient's cognitive ability adequate to safely complete daily activities?: Yes Is the patient deaf or have difficulty hearing?: No Does the patient have difficulty seeing, even when wearing glasses/contacts?: No Does the patient have difficulty concentrating, remembering, or making decisions?: No Patient able to express need for assistance with ADLs?: Yes Does the patient have difficulty dressing or bathing?: No Independently performs ADLs?: Yes (appropriate for developmental age) Does the patient have difficulty walking or climbing stairs?: Yes Weakness of Legs: Left Weakness of Arms/Hands: None  Permission Sought/Granted   Permission granted to share information with : Yes, Verbal Permission Granted              Emotional Assessment Appearance:: Appears stated age Attitude/Demeanor/Rapport: Engaged Affect (typically observed): Appropriate Orientation: : Oriented to Self, Oriented to Place, Oriented to  Time, Oriented to Situation Alcohol / Substance Use: Not Applicable Psych Involvement: No (comment)  Admission diagnosis:  M17.12 unilateral primary osteoarthritis left knee Patient Active Problem List   Diagnosis Date Noted  . S/P TKR (total knee replacement) using cement, left 11/05/2019  . Preop examination 10/16/2019  . DDD (degenerative disc disease), cervical 08/29/2019  . Shoulder pain 08/29/2019  . Stiffness of shoulder joint 08/29/2019  . Lymphedema 08/29/2019  . [place] 05/01/2019  . Allergic rhinitis 03/08/2019  . Left knee pain 09/07/2018  . Hyperlipidemia 02/15/2018  .  Arm numbness left 10/25/2017  . Left carpal tunnel syndrome 10/25/2017  . Polyneuropathy 10/25/2017  . Venous insufficiency 03/27/2017  . Obesity 03/27/2017  . Osteoarthritis of knee  (Left) 11/23/2016  . GERD (gastroesophageal reflux disease) 09/22/2016  . Chronic knee pain (Bilateral) (L>R) 11/17/2015  . Chronic pain 11/17/2015  . Tricompartment osteoarthritis of knee (Left) 11/17/2015  . Knee derangement (Left) 11/17/2015  . Chronic low back pain 11/17/2015  . Chronic neck pain 11/17/2015  . Cervical spondylosis 11/17/2015  . Lumbar spondylosis 11/17/2015  . Osteoporosis 11/17/2015  . Atherosclerotic peripheral vascular disease ([place]) 11/17/2015  . Essential hypertension 02/24/2015  . Full thickness rotator cuff tear 02/24/2015  . Varicose veins of both lower extremities 09/03/2013   PCP:  Leone Haven, MD Pharmacy:   [facility] [address] [place] - [place] [address] [place] [address] Phone: [phone] Fax: [phone]     Social Determinants of Health (SDOH) Interventions    Readmission Risk Interventions No flowsheet data found.

## 2019-11-07 LAB — CBC
HCT: 38.3 % (ref 36.0–46.0)
Hemoglobin: 12.7 g/dL (ref 12.0–15.0)
MCH: 33.2 pg (ref 26.0–34.0)
MCHC: 33.2 g/dL (ref 30.0–36.0)
MCV: 100 fL (ref 80.0–100.0)
Platelets: 142 K/uL — ABNORMAL LOW (ref 150–400)
RBC: 3.83 MIL/uL — ABNORMAL LOW (ref 3.87–5.11)
RDW: 12.1 % (ref 11.5–15.5)
WBC: 6.6 K/uL (ref 4.0–10.5)
nRBC: 0 % (ref 0.0–0.2)

## 2019-11-07 MED ORDER — MENTHOL 3 MG MT LOZG
1.0000 | LOZENGE | OROMUCOSAL | Status: DC | PRN
  Filled 2019-11-07: qty 9

## 2019-11-07 MED ORDER — PHENOL 1.4 % MT LIQD
1.0000 | OROMUCOSAL | Status: DC | PRN
  Administered 2019-11-07: 1 via OROMUCOSAL
  Filled 2019-11-07: qty 177

## 2019-11-07 NOTE — Progress Notes (Signed)
Physical Therapy Treatment Patient Details Name: Ann Spencer MRN: [id number] DOB: 02-25-1941 Today's Date: 11/07/2019    History of Present Illness Pt admitted for L TKR. History includes GERD., HTN, and pneumonia.    PT Comments    Participated in exercises as described below.  To commode to void then continued to walk to/from rehab gym with seated rest before and after stair training.  Pt feeling confident with mobility.  She does voice feeling as though she has a sore throat but also request water for a dry mouth.  Pt voices general fear of catching Covid as her daughter passed earlier this month of the virus.  Encouragement given and RN aware.   Follow Up Recommendations  Home health PT     Equipment Recommendations  Rolling walker with 5" wheels;3in1 (PT)    Recommendations for Other Services       Precautions / Restrictions Precautions Precautions: Fall;Knee Precaution Booklet Issued: No Required Braces or Orthoses: Knee Immobilizer - Left Knee Immobilizer - Left: On when out of bed or walking Restrictions Weight Bearing Restrictions: Yes LLE Weight Bearing: Weight bearing as tolerated Other Position/Activity Restrictions: may take KI off during therapy    Mobility  Bed Mobility Overal bed mobility: Needs Assistance Bed Mobility: Supine to Sit     Supine to sit: Min assist        Transfers Overall transfer level: Needs assistance Equipment used: Rolling walker (2 wheeled) Transfers: Sit to/from Stand Sit to Stand: Min guard         General transfer comment: cues for hand placement. Upright posture noted  Ambulation/Gait Ambulation/Gait assistance: Min guard Gait Distance (Feet): 200 Feet Assistive device: Rolling walker (2 wheeled) Gait Pattern/deviations: Step-to pattern     General Gait Details: ambulated with safe technique, fatigues with increased distance.   Stairs Stairs: Yes Stairs assistance: Min assist;Min guard Stair  Management: Two rails;One rail Right Number of Stairs: 4     Wheelchair Mobility    Modified Rankin (Stroke Patients Only)       Balance Overall balance assessment: History of Falls;Needs assistance Sitting-balance support: Feet supported Sitting balance-Leahy Scale: Good     Standing balance support: Bilateral upper extremity supported Standing balance-Leahy Scale: Good                              Cognition Arousal/Alertness: Awake/alert Behavior During Therapy: WFL for tasks assessed/performed Overall Cognitive Status: Within Functional Limits for tasks assessed                                        Exercises Total Joint Exercises Goniometric ROM: 0-79 - self limts due to pain. Other Exercises Other Exercises: supine ther-ex performed on L LE including AP, quad sets, SLRs, and hip abd/add. All ther-ex performed x 10 reps with min/mod assist. Other Exercises: seated LAQ and knee flexion x 10    General Comments        Pertinent Vitals/Pain Pain Assessment: 0-10 Pain Score: 3  Pain Location: L knee Pain Descriptors / Indicators: Operative site guarding;Aching Pain Intervention(s): Premedicated before session    Home Living                      Prior Function            PT Goals (current goals can  now be found in the care plan section) Progress towards PT goals: Progressing toward goals    Frequency    BID      PT Plan Current plan remains appropriate    Co-evaluation              AM-PAC PT "6 Clicks" Mobility   Outcome Measure  Help needed turning from your back to your side while in a flat bed without using bedrails?: A Little Help needed moving from lying on your back to sitting on the side of a flat bed without using bedrails?: A Little Help needed moving to and from a bed to a chair (including a wheelchair)?: A Little Help needed standing up from a chair using your arms (e.g., wheelchair or  bedside chair)?: A Little Help needed to walk in hospital room?: A Little Help needed climbing 3-5 steps with a railing? : A Little 6 Click Score: 18    End of Session Equipment Utilized During Treatment: Gait belt Activity Tolerance: Patient tolerated treatment well Patient left: in chair;with chair alarm set;with call bell/phone within reach Nurse Communication: Mobility status PT Visit Diagnosis: Muscle weakness (generalized) (M62.81);Difficulty in walking, not elsewhere classified (R26.2);Pain Pain - Right/Left: Left Pain - part of body: Knee     Time: 0922-0956 PT Time Calculation (min) (ACUTE ONLY): 34 min  Charges:  $Gait Training: 8-22 mins $Therapeutic Exercise: 8-22 mins                     Chesley Noon, PTA 11/07/19, 10:44 AM

## 2019-11-07 NOTE — Progress Notes (Signed)
  Subjective:  POD #2 s/p left TKA.   Patient reports left pain as mild to moderate.  She is complaining of a sore throat without fever.   She also has not yet had a bowel movement.  Objective:   VITALS:   Vitals:   11/06/19 1035 11/06/19 1707 11/06/19 2329 11/07/19 0814  BP:  125/68 125/71 129/65  Pulse: 86 72 85 86  Resp:   17   Temp:  97.7 F (36.5 C) (!) 97.5 F (36.4 C) 98.5 F (36.9 C)  TempSrc:  Oral Oral Oral  SpO2: 92% 98% 95% 94%  Weight:      Height:        PHYSICAL EXAM: Left lower extremity:  I personally changed the patient's dressing today. Neurovascular intact Sensation intact distally Intact pulses distally Dorsiflexion/Plantar flexion intact Incision: no drainage, erythema or fluctuance No cellulitis present Compartment soft  LABS  Results for orders placed or performed during the hospital encounter of 11/05/19 (from the past 24 hour(s))  CBC     Status: Abnormal   Collection Time: 11/07/19  6:08 AM  Result Value Ref Range   WBC 6.6 4.0 - 10.5 K/uL   RBC 3.83 (L) 3.87 - 5.11 MIL/uL   Hemoglobin 12.7 12.0 - 15.0 g/dL   HCT 38.3 36.0 - 46.0 %   MCV 100.0 80.0 - 100.0 fL   MCH 33.2 26.0 - 34.0 pg   MCHC 33.2 30.0 - 36.0 g/dL   RDW 12.1 11.5 - 15.5 %   Platelets 142 (L) 150 - 400 K/uL   nRBC 0.0 0.0 - 0.2 %    Dg Knee Left Port  Result Date: 11/05/2019 CLINICAL DATA:  Knee replacement EXAM: PORTABLE LEFT KNEE - 1-2 VIEW COMPARISON:  None FINDINGS: Post total left knee arthroplasty. Postoperative air and soft tissue swelling present. No evidence of complication. IMPRESSION: Standard appearance post total left knee arthroplasty. Electronically Signed   By: Macy Mis M.D.   On: 11/05/2019 11:11    Assessment/Plan: 2 Days Post-Op   Active Problems:   S/P TKR (total knee replacement) using cement, left  Patient is stable postop.  She will continue with physical therapy.  Continue Lovenox for DVT prophylaxis.  Patient is ordered for lozenges  and Chloraseptic spray for her throat.  She will continue using incentive spirometry.  Patient will continue to receive medications to help her have a bowel movement.   Thornton Park , MD 11/07/2019, 10:18 AM

## 2019-11-08 MED ORDER — OXYCODONE HCL 5 MG PO TABS: 5.0000 mg | ORAL_TABLET | ORAL | 0 refills | Status: DC | PRN

## 2019-11-08 MED ORDER — ENOXAPARIN SODIUM 40 MG/0.4ML ~~LOC~~ SOLN: 40.0000 mg | SUBCUTANEOUS | 1 refills | Status: DC

## 2019-11-08 MED ORDER — BISACODYL 5 MG PO TBEC: 10.0000 mg | DELAYED_RELEASE_TABLET | Freq: Every day | ORAL | 0 refills | Status: DC | PRN

## 2019-11-08 MED ORDER — GABAPENTIN 300 MG PO CAPS: 300.0000 mg | ORAL_CAPSULE | Freq: Three times a day (TID) | ORAL | 1 refills | Status: DC

## 2019-11-08 MED ORDER — DOCUSATE SODIUM 100 MG PO CAPS: 100.0000 mg | ORAL_CAPSULE | Freq: Two times a day (BID) | ORAL | 0 refills | Status: DC

## 2019-11-08 NOTE — Discharge Summary (Signed)
Physician Discharge Summary  Patient ID: KALIE WEINHEIMER MRN: [id number] DOB/AGE: 03-14-1941 78 y.o.  Admit date: 11/05/2019 Discharge date: 11/08/2019  Admission Diagnoses:  M17.12 unilateral primary osteoarthritis left knee <principal problem not specified>  Discharge Diagnoses:  M17.12 unilateral primary osteoarthritis left knee Active Problems:   S/P TKR (total knee replacement) using cement, left   Past Medical History:  Diagnosis Date  . Allergy   . Anemia   . Arthritis   . GERD (gastroesophageal reflux disease)   . History of head injury 11/17/2015  . Hypertension   . Phlebitis 2014  . Pneumonia due to infectious organism 02/15/2018  . Rotator cuff injury 12/2013    Surgeries: Procedure(s): TOTAL KNEE ARTHROPLASTY on 11/05/2019   Consultants (if any):   Discharged Condition: Improved  Hospital Course: FLOSS BIONDO is an 78 y.o. female who was admitted 11/05/2019 with a diagnosis of  M17.12 unilateral primary osteoarthritis left knee <principal problem not specified> and went to the operating room on 11/05/2019 and underwent an uncomplicated left total knee arthroplasty.    She was given perioperative antibiotics:  Anti-infectives (From admission, onward)   Start     Dose/Rate Route Frequency Ordered Stop   11/05/19 1245  vancomycin (VANCOCIN) IVPB 1000 mg/200 mL premix     1,000 mg 200 mL/hr over 60 Minutes Intravenous Every 12 hours 11/05/19 1231 11/06/19 0044   11/05/19 0619  clindamycin (CLEOCIN) 600 MG/50ML IVPB    Note to Pharmacy: Myles Lipps   : cabinet override      11/05/19 0619 11/05/19 0755   11/05/19 0600  vancomycin (VANCOCIN) IVPB 1000 mg/200 mL premix     1,000 mg 200 mL/hr over 60 Minutes Intravenous On call to O.R. 11/05/19 0006 11/05/19 0752   11/05/19 0015  clindamycin (CLEOCIN) IVPB 600 mg     600 mg 100 mL/hr over 30 Minutes Intravenous  Once 11/05/19 0006 11/05/19 0805    .  She was given sequential compression devices, early  ambulation, and lovenox for DVT prophylaxis.  She benefited maximally from the hospital stay and there were no complications.    Recent vital signs:  Vitals:   11/08/19 0056 11/08/19 0742  BP: 124/67 118/63  Pulse: 85 90  Resp:  14  Temp: 98.3 F (36.8 C) 98.7 F (37.1 C)  SpO2: 90% 90%    Recent laboratory studies:  Lab Results  Component Value Date   HGB 12.7 11/07/2019   HGB 13.0 11/06/2019   HGB 15.1 (H) 10/28/2019   Lab Results  Component Value Date   WBC 6.6 11/07/2019   PLT 142 (L) 11/07/2019   Lab Results  Component Value Date   INR 0.9 10/28/2019   Lab Results  Component Value Date   NA 135 11/06/2019   K 4.5 11/06/2019   CL 97 (L) 11/06/2019   CO2 28 11/06/2019   BUN 27 (H) 11/06/2019   CREATININE 0.88 11/06/2019   GLUCOSE 135 (H) 11/06/2019    Discharge Medications:   Allergies as of 11/08/2019      Reactions   Penicillins    Did it involve swelling of the face/tongue/throat, SOB, or low BP? Unknown Did it involve sudden or severe rash/hives, skin peeling, or any reaction on the inside of your mouth or nose? Unknown Did you need to seek medical attention at a hospital or doctor's office? Unknown When did it last happen? Childhood allergy If all above answers are "NO", may proceed with cephalosporin use.  Medication List    TAKE these medications   aspirin 81 MG tablet Take 81 mg by mouth daily.   bisacodyl 5 MG EC tablet Commonly known as: DULCOLAX Take 2 tablets (10 mg total) by mouth daily as needed for moderate constipation.   D3 Adult 25 MCG (1000 UT) Chew Generic drug: Cholecalciferol Chew 1,000 Units by mouth daily.   docusate sodium 100 MG capsule Commonly known as: COLACE Take 1 capsule (100 mg total) by mouth 2 (two) times daily.   enoxaparin 40 MG/0.4ML injection Commonly known as: LOVENOX Inject 0.4 mLs (40 mg total) into the skin daily for 14 days. Start taking on: November 09, 2019   ferrous sulfate 325 (65  FE) MG EC tablet Take 325 mg by mouth daily with breakfast.   gabapentin 300 MG capsule Commonly known as: NEURONTIN Take 1 capsule (300 mg total) by mouth 3 (three) times daily.   hydrochlorothiazide 25 MG tablet Commonly known as: HYDRODIURIL Take 1 tablet (25 mg total) by mouth daily.   multivitamin capsule Take 1 capsule by mouth daily.   omeprazole 20 MG capsule Commonly known as: PRILOSEC Take 20 mg by mouth as needed.   OSTEO BI-FLEX JOINT SHIELD PO Take 1 tablet by mouth daily.   oxyCODONE 5 MG immediate release tablet Commonly known as: Oxy IR/ROXICODONE Take 1 tablet (5 mg total) by mouth every 4 (four) hours as needed for moderate pain (pain score 4-6).       Diagnostic Studies: Chest 2 View  Result Date: 10/28/2019 CLINICAL DATA:  78 year old female with preop chest radiograph. EXAM: CHEST - 2 VIEW COMPARISON:  Chest radiograph dated 04/17/2018 FINDINGS: Minimal bibasilar atelectasis. No focal consolidation, pleural effusion, or pneumothorax. The cardiac silhouette is within normal limits. Stable hiatal hernia. No acute osseous pathology. IMPRESSION: No active cardiopulmonary disease. Electronically Signed   By: Anner Crete M.D.   On: 10/28/2019 15:45   Dg Knee Left Port  Result Date: 11/05/2019 CLINICAL DATA:  Knee replacement EXAM: PORTABLE LEFT KNEE - 1-2 VIEW COMPARISON:  None FINDINGS: Post total left knee arthroplasty. Postoperative air and soft tissue swelling present. No evidence of complication. IMPRESSION: Standard appearance post total left knee arthroplasty. Electronically Signed   By: Macy Mis M.D.   On: 11/05/2019 11:11   Vas [place] Burnard Bunting With/wo Tbi  Result Date: 10/17/2019 LOWER EXTREMITY DOPPLER STUDY Indications: Rest pain.  Performing Technologist: Charlane Ferretti RT (R)(VS)  Examination Guidelines: A complete evaluation includes at minimum, Doppler waveform signals and systolic blood pressure reading at the level of bilateral brachial,  anterior tibial, and posterior tibial arteries, when vessel segments are accessible. Bilateral testing is considered an integral part of a complete examination. Photoelectric Plethysmograph (PPG) waveforms and toe systolic pressure readings are included as required and additional duplex testing as needed. Limited examinations for reoccurring indications may be performed as noted.  ABI Findings: +---------+------------------+-----+---------+--------+ Right    Rt Pressure (mmHg)IndexWaveform Comment  +---------+------------------+-----+---------+--------+ Brachial 139                                      +---------+------------------+-----+---------+--------+ ATA      161               1.16 triphasic         +---------+------------------+-----+---------+--------+ PTA      174               1.25 triphasic         +---------+------------------+-----+---------+--------+  Great Toe169               1.22 Normal            +---------+------------------+-----+---------+--------+ +---------+------------------+-----+---------+-------+ Left     Lt Pressure (mmHg)IndexWaveform Comment +---------+------------------+-----+---------+-------+ Brachial 138                                     +---------+------------------+-----+---------+-------+ ATA      182               1.31 triphasic        +---------+------------------+-----+---------+-------+ PTA      180               1.29 triphasic        +---------+------------------+-----+---------+-------+ Great Toe125               0.90 Normal           +---------+------------------+-----+---------+-------+ Summary: Right: Resting right ankle-brachial index is within normal range. No evidence of significant right lower extremity arterial disease. The right toe-brachial index is normal. Left: Resting left ankle-brachial index indicates noncompressible left lower extremity arteries. The left toe-brachial index is normal. Left ABI  appears non compressable possibly due to medial calcification. *See table(s) above for measurements and observations.  Electronically signed by Hortencia Pilar MD on 10/17/2019 at 5:36:41 PM.   Final     Disposition: Discharge disposition: 01-Home or Self Care       Discharge Instructions    Call MD / Call 911   Complete by: As directed    If you experience chest pain or shortness of breath, CALL 911 and be transported to the hospital emergency room.  If you develope a fever above 101 F, pus (white drainage) or increased drainage or redness at the wound, or calf pain, call your surgeon's office.   Constipation Prevention   Complete by: As directed    Drink plenty of fluids.  Prune juice may be helpful.  You may use a stool softener, such as Colace (over the counter) 100 mg twice a day.  Use MiraLax (over the counter) for constipation as needed.   Diet general   Complete by: As directed    Discharge instructions   Complete by: As directed    Continue WBAT on the left lower extremity. Continue to use TED stockings until follow-up. Patient may remove them at night for sleep. Elevate the left lower extremity whenever possible. Continue to use knee immobilizer at night or when lying in bed or when elevating the operative leg. The patient may remove the knee immobilizer to perform exercises or sit in a chair. Continue using the Polar Care for comfort. Keep incision clean and dry. Cover the left knee incision during showers with a plastic bag or Saran wrap. Take lovenox 40 mg once a day or aspirin 325mg  by mouth twice a day for blood clot prevention. Continue to work on knee range of motion exercises at home as instructed by physical therapy. Continue to use a walker for assistance with ambulation until follow-up.   Driving restrictions   Complete by: As directed    No driving 4-6 weeks   Increase activity slowly as tolerated   Complete by: As directed    Lifting restrictions   Complete by: As  directed    No lifting for 12-16 weeks         Signed: Thornton Park ,MD 11/08/2019,  1:43 PM

## 2019-11-08 NOTE — Progress Notes (Signed)
  Subjective:  POD #3 s/p left total knee arthroplasty.   Patient reports left pain as mild.  Patient is on a bowel movement.  Her daughter is at the bedside.  Patient has no complaints.  Objective:   VITALS:   Vitals:   11/07/19 0814 11/07/19 1558 11/08/19 0056 11/08/19 0742  BP: 129/65 118/66 124/67 118/63  Pulse: 86 85 85 90  Resp:  14  14  Temp: 98.5 F (36.9 C) 98.5 F (36.9 C) 98.3 F (36.8 C) 98.7 F (37.1 C)  TempSrc: Oral Oral Oral Oral  SpO2: 94% 97% 90% 90%  Weight:      Height:        PHYSICAL EXAM: Left lower extremity Neurovascular intact Sensation intact distally Intact pulses distally Dorsiflexion/Plantar flexion intact Incision: dressing C/D/I No cellulitis present Compartment soft  LABS  No results found for this or any previous visit (from the past 24 hour(s)).  No results found.  Assessment/Plan: 3 Days Post-Op   Active Problems:   S/P TKR (total knee replacement) using cement, left  Patient doing well.  She has made excellent progress with physical therapy and is ready for discharge home.  She will follow-up with me in the office on December 8.  Patient already has her appointment made.    Thornton Park , MD 11/08/2019, 1:31 PM

## 2019-11-08 NOTE — Progress Notes (Signed)
Physical Therapy Treatment Patient Details Name: Ann Spencer MRN: [id number] DOB: 21-Jun-1941 Today's Date: 11/08/2019    History of Present Illness Pt admitted for L TKR. History includes GERD., HTN, and pneumonia.    PT Comments    Patient in bed, agreeable to PT, reported L knee pain 3/10.  The patient demonstrated bed mobility with supervision. Sit <> stand from EOB and commode with RW and CGA, extensive use of hands, needed to gather momentum. The patient ambulated ~161ft with RW and supervision, endorsed improved ambulation speed, comfort, and decreased pain this AM. Overall the patient continues to progress towards goals and would benefit from further skilled PT to return to PLOF as able.     Follow Up Recommendations  Home health PT     Equipment Recommendations  Rolling walker with 5" wheels;3in1 (PT)    Recommendations for Other Services       Precautions / Restrictions Precautions Precautions: Fall;Knee Precaution Booklet Issued: No Required Braces or Orthoses: Knee Immobilizer - Left Knee Immobilizer - Left: On when out of bed or walking Restrictions Weight Bearing Restrictions: Yes LLE Weight Bearing: Weight bearing as tolerated Other Position/Activity Restrictions: may take KI off during therapy    Mobility  Bed Mobility Overal bed mobility: Needs Assistance Bed Mobility: Supine to Sit     Supine to sit: Supervision;HOB elevated        Transfers Overall transfer level: Needs assistance Equipment used: Rolling walker (2 wheeled) Transfers: Sit to/from Stand Sit to Stand: Min guard            Ambulation/Gait Ambulation/Gait assistance: Supervision Gait Distance (Feet): 200 Feet Assistive device: Rolling walker (2 wheeled) Gait Pattern/deviations: Step-to pattern     General Gait Details: safe, steady with supervision   Stairs             Wheelchair Mobility    Modified Rankin (Stroke Patients Only)       Balance  Overall balance assessment: History of Falls;Needs assistance Sitting-balance support: Feet supported Sitting balance-Leahy Scale: Good       Standing balance-Leahy Scale: Fair                              Cognition Arousal/Alertness: Awake/alert Behavior During Therapy: WFL for tasks assessed/performed Overall Cognitive Status: Within Functional Limits for tasks assessed                                        Exercises Total Joint Exercises Quad Sets: AROM;Left;10 reps Short Arc Quad: AROM;Strengthening;Left;10 reps Heel Slides: AAROM;Strengthening;Left;10 reps Knee Flexion: Strengthening;AAROM;Left;10 reps Goniometric ROM: 0 - 70 limited due to pain Other Exercises Other Exercises: pt able to utilize commode with supervision and heavy use of UEs for safety    General Comments        Pertinent Vitals/Pain Pain Assessment: 0-10 Pain Score: 3  Faces Pain Scale: Hurts even more Pain Location: L knee Pain Descriptors / Indicators: Operative site guarding;Aching Pain Intervention(s): Premedicated before session;Limited activity within patient's tolerance;Monitored during session    Home Living                      Prior Function            PT Goals (current goals can now be found in the care plan section) Progress towards PT goals: Progressing  toward goals    Frequency    BID      PT Plan Current plan remains appropriate    Co-evaluation              AM-PAC PT "6 Clicks" Mobility   Outcome Measure  Help needed turning from your back to your side while in a flat bed without using bedrails?: A Little Help needed moving from lying on your back to sitting on the side of a flat bed without using bedrails?: A Little Help needed moving to and from a bed to a chair (including a wheelchair)?: A Little Help needed standing up from a chair using your arms (e.g., wheelchair or bedside chair)?: A Little Help needed to walk in  hospital room?: A Little Help needed climbing 3-5 steps with a railing? : A Little 6 Click Score: 18    End of Session Equipment Utilized During Treatment: Gait belt Activity Tolerance: Patient tolerated treatment well Patient left: in chair;with call bell/phone within reach(polar care in place) Nurse Communication: Mobility status PT Visit Diagnosis: Muscle weakness (generalized) (M62.81);Difficulty in walking, not elsewhere classified (R26.2);Pain Pain - Right/Left: Left Pain - part of body: Knee     Time: 0851-0920 PT Time Calculation (min) (ACUTE ONLY): 29 min  Charges:  $Therapeutic Exercise: 23-37 mins                     Lieutenant Diego PT, DPT 9:40 AM,11/08/19 [phone]

## 2019-11-08 NOTE — Progress Notes (Signed)
Patient discharged with belongings , VSS. IV removed. xport by NT to patients personal vehicle without incident

## 2019-11-08 NOTE — Care Management Important Message (Signed)
Important Message  Patient Details  Name: Ann Spencer MRN: [id number] Date of Birth: 1941-01-27   Medicare Important Message Given:  Yes     Dannette Barbara 11/08/2019, 11:21 AM

## 2019-11-10 DIAGNOSIS — Z96652 Presence of left artificial knee joint: Secondary | ICD-10-CM | POA: Diagnosis not present

## 2019-11-10 DIAGNOSIS — Z7982 Long term (current) use of aspirin: Secondary | ICD-10-CM | POA: Diagnosis not present

## 2019-11-10 DIAGNOSIS — Z9181 History of falling: Secondary | ICD-10-CM | POA: Diagnosis not present

## 2019-11-10 DIAGNOSIS — M810 Age-related osteoporosis without current pathological fracture: Secondary | ICD-10-CM | POA: Diagnosis not present

## 2019-11-10 DIAGNOSIS — K219 Gastro-esophageal reflux disease without esophagitis: Secondary | ICD-10-CM | POA: Diagnosis not present

## 2019-11-10 DIAGNOSIS — Z7901 Long term (current) use of anticoagulants: Secondary | ICD-10-CM | POA: Diagnosis not present

## 2019-11-10 DIAGNOSIS — D649 Anemia, unspecified: Secondary | ICD-10-CM | POA: Diagnosis not present

## 2019-11-10 DIAGNOSIS — Z471 Aftercare following joint replacement surgery: Secondary | ICD-10-CM | POA: Diagnosis not present

## 2019-11-10 DIAGNOSIS — M5030 Other cervical disc degeneration, unspecified cervical region: Secondary | ICD-10-CM | POA: Diagnosis not present

## 2019-11-10 DIAGNOSIS — Z87891 Personal history of nicotine dependence: Secondary | ICD-10-CM | POA: Diagnosis not present

## 2019-11-19 ENCOUNTER — Other Ambulatory Visit: Payer: Self-pay | Admitting: Family Medicine

## 2019-11-19 DIAGNOSIS — I10 Essential (primary) hypertension: Secondary | ICD-10-CM

## 2019-11-19 DIAGNOSIS — Z96652 Presence of left artificial knee joint: Secondary | ICD-10-CM | POA: Diagnosis not present

## 2019-11-22 ENCOUNTER — Encounter (INDEPENDENT_AMBULATORY_CARE_PROVIDER_SITE_OTHER): Payer: Self-pay

## 2019-11-22 ENCOUNTER — Ambulatory Visit (INDEPENDENT_AMBULATORY_CARE_PROVIDER_SITE_OTHER): Payer: Medicare Other | Admitting: Nurse Practitioner

## 2019-11-22 ENCOUNTER — Telehealth (INDEPENDENT_AMBULATORY_CARE_PROVIDER_SITE_OTHER): Payer: Self-pay | Admitting: Vascular Surgery

## 2019-11-22 ENCOUNTER — Other Ambulatory Visit: Payer: Self-pay

## 2019-11-22 ENCOUNTER — Encounter: Payer: Self-pay | Admitting: Intensive Care

## 2019-11-22 ENCOUNTER — Inpatient Hospital Stay
Admission: EM | Admit: 2019-11-22 | Discharge: 2019-11-25 | DRG: 603 | Disposition: A | Discharge status: Home / Self Care | Payer: Medicare Other | Attending: Internal Medicine | Admitting: Internal Medicine

## 2019-11-22 VITALS — BP 139/77 | HR 92 | Resp 16 | Wt 241.0 lb

## 2019-11-22 DIAGNOSIS — K219 Gastro-esophageal reflux disease without esophagitis: Secondary | ICD-10-CM | POA: Diagnosis present

## 2019-11-22 DIAGNOSIS — Z79899 Other long term (current) drug therapy: Secondary | ICD-10-CM | POA: Diagnosis not present

## 2019-11-22 DIAGNOSIS — M7989 Other specified soft tissue disorders: Secondary | ICD-10-CM

## 2019-11-22 DIAGNOSIS — L03115 Cellulitis of right lower limb: Principal | ICD-10-CM | POA: Diagnosis present

## 2019-11-22 DIAGNOSIS — Z90710 Acquired absence of both cervix and uterus: Secondary | ICD-10-CM | POA: Diagnosis not present

## 2019-11-22 DIAGNOSIS — Z880 Allergy status to penicillin: Secondary | ICD-10-CM

## 2019-11-22 DIAGNOSIS — I739 Peripheral vascular disease, unspecified: Secondary | ICD-10-CM | POA: Diagnosis present

## 2019-11-22 DIAGNOSIS — L03119 Cellulitis of unspecified part of limb: Secondary | ICD-10-CM | POA: Diagnosis present

## 2019-11-22 DIAGNOSIS — I70209 Unspecified atherosclerosis of native arteries of extremities, unspecified extremity: Secondary | ICD-10-CM | POA: Diagnosis present

## 2019-11-22 DIAGNOSIS — I890 Lymphedema, not elsewhere classified: Secondary | ICD-10-CM | POA: Diagnosis present

## 2019-11-22 DIAGNOSIS — L03116 Cellulitis of left lower limb: Secondary | ICD-10-CM | POA: Diagnosis present

## 2019-11-22 DIAGNOSIS — Z20828 Contact with and (suspected) exposure to other viral communicable diseases: Secondary | ICD-10-CM | POA: Diagnosis present

## 2019-11-22 DIAGNOSIS — Z7982 Long term (current) use of aspirin: Secondary | ICD-10-CM | POA: Diagnosis not present

## 2019-11-22 DIAGNOSIS — Z96652 Presence of left artificial knee joint: Secondary | ICD-10-CM

## 2019-11-22 DIAGNOSIS — Z87891 Personal history of nicotine dependence: Secondary | ICD-10-CM | POA: Diagnosis not present

## 2019-11-22 DIAGNOSIS — I10 Essential (primary) hypertension: Secondary | ICD-10-CM | POA: Diagnosis present

## 2019-11-22 HISTORY — DX: Peripheral vascular disease, unspecified: I73.9

## 2019-11-22 HISTORY — DX: Cellulitis, unspecified: L03.90

## 2019-11-22 LAB — COMPREHENSIVE METABOLIC PANEL
ALT: 32 U/L (ref 0–44)
AST: 28 U/L (ref 15–41)
Albumin: 3.7 g/dL (ref 3.5–5.0)
Alkaline Phosphatase: 87 U/L (ref 38–126)
Anion gap: 12 (ref 5–15)
BUN: 17 mg/dL (ref 8–23)
CO2: 26 mmol/L (ref 22–32)
Calcium: 9.3 mg/dL (ref 8.9–10.3)
Chloride: 96 mmol/L — ABNORMAL LOW (ref 98–111)
Creatinine, Ser: 1.01 mg/dL — ABNORMAL HIGH (ref 0.44–1.00)
GFR calc Af Amer: >60 mL/min (ref >60)
GFR calc non Af Amer: 54 mL/min — ABNORMAL LOW (ref >60)
Glucose, Bld: 123 mg/dL — ABNORMAL HIGH (ref 70–99)
Potassium: 4 mmol/L (ref 3.5–5.1)
Sodium: 134 mmol/L — ABNORMAL LOW (ref 135–145)
Total Bilirubin: 0.9 mg/dL (ref 0.3–1.2)
Total Protein: 7.4 g/dL (ref 6.5–8.1)

## 2019-11-22 LAB — URINALYSIS, COMPLETE (UACMP) WITH MICROSCOPIC
Bilirubin Urine: NEGATIVE
Glucose, UA: NEGATIVE mg/dL
Hgb urine dipstick: NEGATIVE
Ketones, ur: 5 mg/dL — AB
Leukocytes,Ua: NEGATIVE
Nitrite: NEGATIVE
Protein, ur: NEGATIVE mg/dL
Specific Gravity, Urine: 1.017 (ref 1.005–1.030)
pH: 6 (ref 5.0–8.0)

## 2019-11-22 LAB — CBC WITH DIFFERENTIAL/PLATELET
Abs Immature Granulocytes: 0.03 10*3/uL (ref 0.00–0.07)
Basophils Absolute: 0 10*3/uL (ref 0.0–0.1)
Basophils Relative: 1 %
Eosinophils Absolute: 0.1 10*3/uL (ref 0.0–0.5)
Eosinophils Relative: 2 %
HCT: 38.4 % (ref 36.0–46.0)
Hemoglobin: 13.2 g/dL (ref 12.0–15.0)
Immature Granulocytes: 1 %
Lymphocytes Relative: 8 %
Lymphs Abs: 0.5 10*3/uL — ABNORMAL LOW (ref 0.7–4.0)
MCH: 32.8 pg (ref 26.0–34.0)
MCHC: 34.4 g/dL (ref 30.0–36.0)
MCV: 95.5 fL (ref 80.0–100.0)
Monocytes Absolute: 0.5 10*3/uL (ref 0.1–1.0)
Monocytes Relative: 7 %
Neutro Abs: 5.2 10*3/uL (ref 1.7–7.7)
Neutrophils Relative %: 81 %
Platelets: 216 10*3/uL (ref 150–400)
RBC: 4.02 MIL/uL (ref 3.87–5.11)
RDW: 11.8 % (ref 11.5–15.5)
WBC: 6.4 10*3/uL (ref 4.0–10.5)
nRBC: 0 % (ref 0.0–0.2)

## 2019-11-22 LAB — LACTIC ACID, PLASMA: Lactic Acid, Venous: 1.1 mmol/L (ref 0.5–1.9)

## 2019-11-22 MED ORDER — VANCOMYCIN HCL IN DEXTROSE 1-5 GM/200ML-% IV SOLN
1000.0000 mg | Freq: Once | INTRAVENOUS | Status: AC
  Administered 2019-11-22: 23:00:00 1000 mg via INTRAVENOUS
  Filled 2019-11-22: qty 200

## 2019-11-22 MED ORDER — VANCOMYCIN HCL 10 G IV SOLR
1500.0000 mg | Freq: Once | INTRAVENOUS | Status: AC
  Administered 2019-11-22: 1500 mg via INTRAVENOUS
  Filled 2019-11-22: qty 1500

## 2019-11-22 MED ORDER — SODIUM CHLORIDE 0.9 % IV SOLN
1.0000 g | Freq: Once | INTRAVENOUS | Status: AC
  Administered 2019-11-22: 1 g via INTRAVENOUS
  Filled 2019-11-22: qty 10

## 2019-11-22 NOTE — H&P (Addendum)
History and Physical    Ann Spencer [id number] DOB: July 26, 1941 DOA: 11/22/2019  PCP: Leone Haven, MD  Patient coming from: home   Chief Complaint: lower extremity pain and redness  HPI: Ann Spencer is a 78 y.o. female with medical history significant for chronic lymphedema of lower extremities, htn, and recent TKA of left knee, who presents with above.  On 11/20 has left knee arthroplasty. Follows with [facility], is on a lymphedema pump from them. But that extends to thigh and so hasn't been using since her surgery on her left knee. Patient has noticed one week of worsening swelling and redness of her anterior lower extremities. Warm to the touch as well. Saw ortho for f/u, told had cellulitis, started on bactrim. Symptoms have continued to worsen. No fever, no nausea or vomiting, no cough, no chest pain, no drainage.   Thinks may have had a DVT many years ago.   Says childhood allergy to penicillin, unsure of her reaction type.   ED Course: labs, antibiotics  Review of Systems: As per HPI otherwise 10 point review of systems negative.    Past Medical History:  Diagnosis Date  . Allergy   . Anemia   . Arthritis   . Cellulitis   . GERD (gastroesophageal reflux disease)   . History of head injury 11/17/2015  . Hypertension   . PAD (peripheral artery disease) ([place])   . Phlebitis 2014  . Pneumonia due to infectious organism 02/15/2018  . Rotator cuff injury 12/2013    Past Surgical History:  Procedure Laterality Date  . ABDOMINAL HYSTERECTOMY  1981   Full  . COLONOSCOPY    . SHOULDER SURGERY Left 03/03/15  . TOTAL KNEE ARTHROPLASTY Left 11/05/2019   Procedure: TOTAL KNEE ARTHROPLASTY;  Surgeon: Thornton Park, MD;  Location: [facility] ORS;  Service: Orthopedics;  Laterality: Left;  . vein closure Bilateral Feb 2008     reports that she quit smoking about 25 years ago. Her smoking use included cigarettes. She has a 30.00 pack-year smoking  history. She has never used smokeless tobacco. She reports current alcohol use of about 7.0 standard drinks of alcohol per week. She reports that she does not use drugs.  Allergies  Allergen Reactions  . Penicillins     Did it involve swelling of the face/tongue/throat, SOB, or low BP? Unknown Did it involve sudden or severe rash/hives, skin peeling, or any reaction on the inside of your mouth or nose? Unknown Did you need to seek medical attention at a hospital or doctor's office? Unknown When did it last happen? Childhood allergy If all above answers are "NO", may proceed with cephalosporin use.     Family History  Problem Relation Age of Onset  . Varicose Veins Brother     Prior to Admission medications   Medication Sig Start Date End Date Taking? Authorizing Provider  aspirin 81 MG tablet Take 81 mg by mouth daily.    [provider]  bisacodyl (DULCOLAX) 5 MG EC tablet Take 2 tablets (10 mg total) by mouth daily as needed for moderate constipation. 11/08/19   Thornton Park, MD  Cholecalciferol (D3 ADULT) 25 MCG (1000 UT) CHEW Chew 1,000 Units by mouth daily.     [provider]  docusate sodium (COLACE) 100 MG capsule Take 1 capsule (100 mg total) by mouth 2 (two) times daily. 11/08/19   Thornton Park, MD  enoxaparin (LOVENOX) 40 MG/0.4ML injection Inject 0.4 mLs (40 mg total) into the skin  daily for 14 days. 11/09/19 11/23/19  Thornton Park, MD  ferrous sulfate 325 (65 FE) MG EC tablet Take 325 mg by mouth daily with breakfast.    [provider]  gabapentin (NEURONTIN) 300 MG capsule Take 1 capsule (300 mg total) by mouth 3 (three) times daily. 11/08/19   Thornton Park, MD  hydrochlorothiazide (HYDRODIURIL) 25 MG tablet Take 1 tablet by mouth once daily 11/20/19   Leone Haven, MD  Misc Natural Products (OSTEO BI-FLEX JOINT SHIELD PO) Take 1 tablet by mouth daily.     [provider]  Multiple Vitamin (MULTIVITAMIN)  capsule Take 1 capsule by mouth daily.    [provider]  omeprazole (PRILOSEC) 20 MG capsule Take 20 mg by mouth as needed.    [provider]  oxyCODONE (OXY IR/ROXICODONE) 5 MG immediate release tablet Take 1 tablet (5 mg total) by mouth every 4 (four) hours as needed for moderate pain (pain score 4-6). 11/08/19   Thornton Park, MD    Physical Exam: Vitals:   11/22/19 1538 11/22/19 2014 11/22/19 2146 11/22/19 2347  BP:  139/75 135/60 117/62  Pulse:  88 98 97  Resp:  16 16 17   Temp:  98.3 F (36.8 C)    TempSrc:  Oral    SpO2:  100% 96% 95%  Weight: 109.3 kg     Height: 5\' 5"  (1.651 m)       Constitutional: No acute distress Head: Atraumatic Eyes: Conjunctiva clear ENM: Moist mucous membranes. Normal dentition.  Neck: Supple Respiratory: Clear to auscultation bilaterally, no wheezing/rales/rhonchi. Normal respiratory effort. No accessory muscle use. . Cardiovascular: Regular rate and rhythm. No murmurs/rubs/gallops. Abdomen: Non-tender, mildly distended. No masses. No rebound or guarding. Positive bowel sounds. Musculoskeletal: No joint deformity upper and lower extremities. Normal ROM, no contractures. Normal muscle tone. Bandage over left anterior knee Skin: No rashes, lesions, or ulcers.  Extremities: Mod pitting edema to knees b/l lower extremities. Erythema and warmth mainly anterior.    Neurologic: Alert, moving all 4 extremities. Psychiatric: Normal insight and judgement.   Labs on Admission: I have personally reviewed following labs and imaging studies  CBC: Recent Labs  Lab 11/22/19 1542  WBC 6.4  NEUTROABS 5.2  HGB 13.2  HCT 38.4  MCV 95.5  PLT 123XX123   Basic Metabolic Panel: Recent Labs  Lab 11/22/19 1542  NA 134*  K 4.0  CL 96*  CO2 26  GLUCOSE 123*  BUN 17  CREATININE 1.01*  CALCIUM 9.3   GFR: Estimated Creatinine Clearance: 57.4 mL/min (A) (by C-G formula based on SCr of 1.01 mg/dL (H)). Liver Function Tests: Recent  Labs  Lab 11/22/19 1542  AST 28  ALT 32  ALKPHOS 87  BILITOT 0.9  PROT 7.4  ALBUMIN 3.7   No results for input(s): LIPASE, AMYLASE in the last 168 hours. No results for input(s): AMMONIA in the last 168 hours. Coagulation Profile: No results for input(s): INR, PROTIME in the last 168 hours. Cardiac Enzymes: No results for input(s): CKTOTAL, CKMB, CKMBINDEX, TROPONINI in the last 168 hours. BNP (last 3 results) No results for input(s): PROBNP in the last 8760 hours. HbA1C: No results for input(s): HGBA1C in the last 72 hours. CBG: No results for input(s): GLUCAP in the last 168 hours. Lipid Profile: No results for input(s): CHOL, HDL, LDLCALC, TRIG, CHOLHDL, LDLDIRECT in the last 72 hours. Thyroid Function Tests: No results for input(s): TSH, T4TOTAL, FREET4, T3FREE, THYROIDAB in the last 72 hours. Anemia Panel: No results  for input(s): VITAMINB12, FOLATE, FERRITIN, TIBC, IRON, RETICCTPCT in the last 72 hours. Urine analysis:    Component Value Date/Time   COLORURINE YELLOW (A) 11/22/2019 2033   APPEARANCEUR CLEAR (A) 11/22/2019 2033   LABSPEC 1.017 11/22/2019 2033   PHURINE 6.0 11/22/2019 2033   [place] NEGATIVE 11/22/2019 2033   [place] NEGATIVE 11/22/2019 2033   [place] NEGATIVE 11/22/2019 2033   KETONESUR 5 (A) 11/22/2019 2033   PROTEINUR NEGATIVE 11/22/2019 2033   NITRITE NEGATIVE 11/22/2019 2033   LEUKOCYTESUR NEGATIVE 11/22/2019 2033    Radiological Exams on Admission: No results found.   Assessment/Plan Principal Problem:   Cellulitis of lower extremity Active Problems:   Essential hypertension   Atherosclerotic peripheral vascular disease (HCC)   Lymphedema   S/P TKR (total knee replacement) using cement, left   Lower extremity cellulitis   # Lower extremity cellulitis - baseline of significant lymphedema; hasn't been using lyphedema pump for several weeks 2/2 recent knee surgery. This could be worsening venous stasis dermatitis, but given  relatively abrupt onset, sig redness and warmth, and significantly more pain than usual for this patient, do think reasonable to treat for cellulitis; if cellulitis then did not respond to outpt bactrim. When pain permits will need to resume compression therapy. Given recent surgery and reported history of possible dvt in the past will check u/s for dvt/ - f/u u/s - continue vancomycin - oxycodone for pain  # s/p left total knee arthroplasty - saw ortho recently, told healing well - outpt f/u - cont home oxycodone, gabapentin  # htn - here bp wnl - cont home hctz, asa   DVT prophylaxis: lovenox Code Status: full  Family Communication: [place], daughter  Disposition Plan: tbd  Consults called: none  Admission status: med/surg    Desma Maxim MD [facility] Pager [phone]  If 7PM-7AM, please contact night-coverage [email or web address] Password [facility]  11/22/2019, 11:54 PM

## 2019-11-22 NOTE — ED Triage Notes (Signed)
Patient has noted bilateral noted cellulitis that popped up last week. On Tuesday was given sulfa but cellulitis has gotten worse and pt was sent to ER. Having shooting pains in legs

## 2019-11-22 NOTE — Telephone Encounter (Signed)
Patient daughter informed that her mother was prescribe antibiotics by Dr Mack Guise for the leg redness on Tuesday when she went for her follow up knee surgery. The patient has not been able to wear her compressions stockings. The right leg has been red for 2 days and the blisters came up this morning. Please Advise

## 2019-11-22 NOTE — Progress Notes (Signed)
PHARMACY -  BRIEF ANTIBIOTIC NOTE   Pharmacy has received consult(s) for Vancomycin  from an ED provider.  The patient's profile has been reviewed for ht/wt/allergies/indication/available labs.    One time order(s) placed for Vancomycin 2500 mg IV X 1   Further antibiotics/pharmacy consults should be ordered by admitting physician if indicated.                       Thank you, Reshanda Lewey D 11/22/2019  9:42 PM

## 2019-11-22 NOTE — Telephone Encounter (Signed)
Do to schedule contraints, we can bring her in for a nurse visit with wraps only.  Since she is being treated by Dr. Mack Guise by antibiotics for the leg redness, we can do unna wraps to help with swelling and blisters

## 2019-11-22 NOTE — ED Provider Notes (Signed)
[facility] Emergency Department Provider Note  ____________________________________________   First MD Initiated Contact with Patient 11/22/19 2008     (approximate)  I have reviewed the triage vital signs and the nursing notes.  History  Chief Complaint Recurrent Skin Infections (bilateral)    HPI Ann Spencer is a 78 y.o. female with history of HTN, PAD, GERD, venous stasis, recent left total knee arthroplasty on 11/05/2019 who presents emergency department for bilateral lower leg redness, concern for cellulitis.  Patient states she first noted a small amount of redness to the left lower leg on Saturday.  She saw her orthopedic surgeon on Tuesday for routine follow-up, he started her on Bactrim for mild cellulitis.  On chart review, this was described as slight erythema in the left lower leg.  Patient reports despite her compliance with the oral antibiotics, the redness and warmth has worsened.  This is now present to the bilateral lower legs, significantly erythematous and warm, tender to touch.  She reports associated pain to these areas, 6-7/10 in severity, described as aching/burning/sharp.  This is constant, without radiation, no alleviating/aggravating factors.  She denies any associated fevers.  She denies any erythema, pain, or swelling to the left knee surgical site.  She has been mobilizing and working well with her physical therapy.     Past Medical Hx Past Medical History:  Diagnosis Date  . Allergy   . Anemia   . Arthritis   . Cellulitis   . GERD (gastroesophageal reflux disease)   . History of head injury 11/17/2015  . Hypertension   . PAD (peripheral artery disease) ([place])   . Phlebitis 2014  . Pneumonia due to infectious organism 02/15/2018  . Rotator cuff injury 12/2013    Problem List Patient Active Problem List   Diagnosis Date Noted  . S/P TKR (total knee replacement) using cement, left 11/05/2019  . Preop examination 10/16/2019    . DDD (degenerative disc disease), cervical 08/29/2019  . Shoulder pain 08/29/2019  . Stiffness of shoulder joint 08/29/2019  . Lymphedema 08/29/2019  . [place] 05/01/2019  . Allergic rhinitis 03/08/2019  . Left knee pain 09/07/2018  . Hyperlipidemia 02/15/2018  . Arm numbness left 10/25/2017  . Left carpal tunnel syndrome 10/25/2017  . Polyneuropathy 10/25/2017  . Venous insufficiency 03/27/2017  . Obesity 03/27/2017  . Osteoarthritis of knee (Left) 11/23/2016  . GERD (gastroesophageal reflux disease) 09/22/2016  . Chronic knee pain (Bilateral) (L>R) 11/17/2015  . Chronic pain 11/17/2015  . Tricompartment osteoarthritis of knee (Left) 11/17/2015  . Knee derangement (Left) 11/17/2015  . Chronic low back pain 11/17/2015  . Chronic neck pain 11/17/2015  . Cervical spondylosis 11/17/2015  . Lumbar spondylosis 11/17/2015  . Osteoporosis 11/17/2015  . Atherosclerotic peripheral vascular disease ([place]) 11/17/2015  . Essential hypertension 02/24/2015  . Full thickness rotator cuff tear 02/24/2015  . Varicose veins of both lower extremities 09/03/2013    Past Surgical Hx Past Surgical History:  Procedure Laterality Date  . ABDOMINAL HYSTERECTOMY  1981   Full  . COLONOSCOPY    . SHOULDER SURGERY Left 03/03/15  . TOTAL KNEE ARTHROPLASTY Left 11/05/2019   Procedure: TOTAL KNEE ARTHROPLASTY;  Surgeon: Thornton Park, MD;  Location: [facility] ORS;  Service: Orthopedics;  Laterality: Left;  . vein closure Bilateral Feb 2008    Medications Prior to Admission medications   Medication Sig Start Date End Date Taking? Authorizing Provider  aspirin 81 MG tablet Take 81 mg by mouth daily.    [provider]  bisacodyl (DULCOLAX) 5 MG EC tablet Take 2 tablets (10 mg total) by mouth daily as needed for moderate constipation. 11/08/19   Thornton Park, MD  Cholecalciferol (D3 ADULT) 25 MCG (1000 UT) CHEW Chew 1,000 Units by mouth daily.     [provider]  docusate sodium  (COLACE) 100 MG capsule Take 1 capsule (100 mg total) by mouth 2 (two) times daily. 11/08/19   Thornton Park, MD  enoxaparin (LOVENOX) 40 MG/0.4ML injection Inject 0.4 mLs (40 mg total) into the skin daily for 14 days. 11/09/19 11/23/19  Thornton Park, MD  ferrous sulfate 325 (65 FE) MG EC tablet Take 325 mg by mouth daily with breakfast.    [provider]  gabapentin (NEURONTIN) 300 MG capsule Take 1 capsule (300 mg total) by mouth 3 (three) times daily. 11/08/19   Thornton Park, MD  hydrochlorothiazide (HYDRODIURIL) 25 MG tablet Take 1 tablet by mouth once daily 11/20/19   Leone Haven, MD  Misc Natural Products (OSTEO BI-FLEX JOINT SHIELD PO) Take 1 tablet by mouth daily.     [provider]  Multiple Vitamin (MULTIVITAMIN) capsule Take 1 capsule by mouth daily.    [provider]  omeprazole (PRILOSEC) 20 MG capsule Take 20 mg by mouth as needed.    [provider]  oxyCODONE (OXY IR/ROXICODONE) 5 MG immediate release tablet Take 1 tablet (5 mg total) by mouth every 4 (four) hours as needed for moderate pain (pain score 4-6). 11/08/19   Thornton Park, MD  sulfamethoxazole-trimethoprim (BACTRIM DS) 800-160 MG tablet sulfamethoxazole 800 mg-trimethoprim 160 mg tablet  Take 1 tablet every 12 hours by oral route.    [provider]    Allergies Penicillins  Family Hx Family History  Problem Relation Age of Onset  . Varicose Veins Brother     Social Hx Social History   Tobacco Use  . Smoking status: Former Smoker    Packs/day: 1.00    Years: 30.00    Pack years: 30.00    Types: Cigarettes    Quit date: 12/12/1993    Years since quitting: 25.9  . Smokeless tobacco: Never Used  Substance Use Topics  . Alcohol use: Yes    Alcohol/week: 7.0 standard drinks    Types: 7 Standard drinks or equivalent per week    Comment: Wine qhs  . Drug use: No     Review of Systems  Constitutional: Negative for fever, chills. Eyes:  Negative for visual changes. ENT: Negative for sore throat. Cardiovascular: Negative for chest pain. Respiratory: Negative for shortness of breath. Gastrointestinal: Negative for nausea, vomiting.  Genitourinary: Negative for dysuria. Musculoskeletal: Negative for leg swelling. Skin: + redness, warmth of the bilateral lower legs Neurological: Negative for for headaches.   Physical Exam  Vital Signs: ED Triage Vitals  Enc Vitals Group     BP 11/22/19 2014 139/75     Pulse Rate 11/22/19 2014 88     Resp 11/22/19 2014 16     Temp 11/22/19 2014 98.3 F (36.8 C)     Temp Source 11/22/19 2014 Oral     SpO2 11/22/19 2014 100 %     Weight 11/22/19 1538 241 lb (109.3 kg)     Height 11/22/19 1538 5\' 5"  (1.651 m)     Head Circumference --      Peak Flow --      Pain Score 11/22/19 1538 7     Pain Loc --      Pain  Edu? --      Excl. in [place]? --     Constitutional: Alert and oriented.  Head: Normocephalic. Atraumatic. Eyes: Conjunctivae clear. Sclera anicteric. Nose: No congestion. No rhinorrhea. Mouth/Throat: Wearing mask.  Neck: No stridor.   Cardiovascular: Normal rate, regular rhythm. Extremities well perfused. Feet are WWP. 2+ symmetric distal pulses.  Respiratory: Normal respiratory effort.  Lungs CTAB. Gastrointestinal: Soft. Non-tender. Non-distended.  Musculoskeletal: No lower extremity edema. Able to range at her left knee.  Neurologic:  Normal speech and language. No gross focal neurologic deficits are appreciated.  Skin: TKA incision site on LEFT is C/D/I without associated erythema, warmth, or drainage. Marked, impressive beefy red erythema to the bilateral lower legs, associated warmth and tenderness.  Area spans from ~proximal ankle to the ~proximal shin. This does not involve her surgical incision site. Psychiatric: Mood and affect are appropriate for situation.  EKG  N/A   Radiology  N/A   Procedures  Procedure(s) performed (including critical  care):  Procedures   Initial Impression / Assessment and Plan / ED Course  78 y.o. female who presents to the ED for significant BLE redness concern for cellulitis, despite compliance with outpatient Bactrim.  Labs initiated in triage without actionable derangements. HDS. Presentation could be related to her history of venous stasis, venous stasis dermatitis, however concern for the acuity/rapid progression of her symptoms, and appearance on exam. Especially since it has worsened despite being on Bactrim.  Will give IV antibiotics, and ultimately plan for admission for observation as we would like to avoid progression of possible cellulitis to the knee area given her recent surgery and incision site.  At this time her cellulitis does not involve her surgical incision site and she has good range of motion at the joint. No concern for septic or infected joint. Low suspicion for DVT given she has been compliant with her post operative Lovenox injections. Discussed with hospitalist for admission.   Final Clinical Impression(s) / ED Diagnosis  Final diagnoses:  Bilateral lower leg cellulitis       Note:  This document was prepared using Dragon voice recognition software and may include unintentional dictation errors.   Lilia Pro., MD 11/22/19 2228

## 2019-11-22 NOTE — Telephone Encounter (Signed)
Patient is being schedule

## 2019-11-23 ENCOUNTER — Inpatient Hospital Stay: Payer: Medicare Other

## 2019-11-23 DIAGNOSIS — I890 Lymphedema, not elsewhere classified: Secondary | ICD-10-CM

## 2019-11-23 DIAGNOSIS — L03115 Cellulitis of right lower limb: Principal | ICD-10-CM

## 2019-11-23 DIAGNOSIS — I10 Essential (primary) hypertension: Secondary | ICD-10-CM

## 2019-11-23 DIAGNOSIS — L03116 Cellulitis of left lower limb: Secondary | ICD-10-CM

## 2019-11-23 LAB — CREATININE, SERUM
Creatinine, Ser: 0.88 mg/dL (ref 0.44–1.00)
GFR calc Af Amer: >60 mL/min (ref >60)
GFR calc non Af Amer: >60 mL/min (ref >60)

## 2019-11-23 LAB — CBC
HCT: 35.9 % — ABNORMAL LOW (ref 36.0–46.0)
Hemoglobin: 11.8 g/dL — ABNORMAL LOW (ref 12.0–15.0)
MCH: 32.3 pg (ref 26.0–34.0)
MCHC: 32.9 g/dL (ref 30.0–36.0)
MCV: 98.4 fL (ref 80.0–100.0)
Platelets: 206 10*3/uL (ref 150–400)
RBC: 3.65 MIL/uL — ABNORMAL LOW (ref 3.87–5.11)
RDW: 11.7 % (ref 11.5–15.5)
WBC: 4.1 10*3/uL (ref 4.0–10.5)
nRBC: 0 % (ref 0.0–0.2)

## 2019-11-23 LAB — SARS CORONAVIRUS 2 (TAT 6-24 HRS): SARS Coronavirus 2: NEGATIVE

## 2019-11-23 MED ORDER — GABAPENTIN 300 MG PO CAPS
300.0000 mg | ORAL_CAPSULE | Freq: Three times a day (TID) | ORAL | Status: DC
  Administered 2019-11-23 – 2019-11-25 (×7): 300 mg via ORAL
  Filled 2019-11-23 (×7): qty 1

## 2019-11-23 MED ORDER — ENOXAPARIN SODIUM 40 MG/0.4ML ~~LOC~~ SOLN
40.0000 mg | SUBCUTANEOUS | Status: DC
  Administered 2019-11-23 – 2019-11-25 (×3): 40 mg via SUBCUTANEOUS
  Filled 2019-11-23 (×3): qty 0.4

## 2019-11-23 MED ORDER — VANCOMYCIN HCL IN DEXTROSE 1-5 GM/200ML-% IV SOLN
1000.0000 mg | INTRAVENOUS | Status: DC
  Administered 2019-11-23 – 2019-11-24 (×2): 1000 mg via INTRAVENOUS
  Filled 2019-11-23 (×3): qty 200

## 2019-11-23 MED ORDER — KETOROLAC TROMETHAMINE 15 MG/ML IJ SOLN
15.0000 mg | Freq: Four times a day (QID) | INTRAMUSCULAR | Status: DC | PRN
  Administered 2019-11-23 – 2019-11-25 (×3): 15 mg via INTRAVENOUS
  Filled 2019-11-23 (×5): qty 1

## 2019-11-23 MED ORDER — HYDROCHLOROTHIAZIDE 25 MG PO TABS
25.0000 mg | ORAL_TABLET | Freq: Every day | ORAL | Status: DC
  Administered 2019-11-23 – 2019-11-25 (×3): 25 mg via ORAL
  Filled 2019-11-23 (×3): qty 1

## 2019-11-23 MED ORDER — SODIUM CHLORIDE 0.9 % IV SOLN: 250.0000 mL | INTRAVENOUS | Status: DC | PRN

## 2019-11-23 MED ORDER — OXYCODONE HCL 5 MG PO TABS: 5.0000 mg | ORAL_TABLET | ORAL | Status: DC | PRN

## 2019-11-23 MED ORDER — PANTOPRAZOLE SODIUM 40 MG PO TBEC
40.0000 mg | DELAYED_RELEASE_TABLET | Freq: Every day | ORAL | Status: DC
  Administered 2019-11-23 – 2019-11-25 (×3): 40 mg via ORAL
  Filled 2019-11-23 (×3): qty 1

## 2019-11-23 MED ORDER — SODIUM CHLORIDE 0.9% FLUSH: 3.0000 mL | INTRAVENOUS | Status: DC | PRN

## 2019-11-23 MED ORDER — DOCUSATE SODIUM 100 MG PO CAPS
100.0000 mg | ORAL_CAPSULE | Freq: Two times a day (BID) | ORAL | Status: DC
  Administered 2019-11-23 – 2019-11-25 (×5): 100 mg via ORAL
  Filled 2019-11-23 (×6): qty 1

## 2019-11-23 MED ORDER — SODIUM CHLORIDE 0.9% FLUSH
3.0000 mL | Freq: Two times a day (BID) | INTRAVENOUS | Status: DC
  Administered 2019-11-23 – 2019-11-24 (×4): 3 mL via INTRAVENOUS

## 2019-11-23 MED ORDER — ASPIRIN 81 MG PO CHEW
81.0000 mg | CHEWABLE_TABLET | Freq: Every day | ORAL | Status: DC
  Administered 2019-11-23 – 2019-11-25 (×3): 81 mg via ORAL
  Filled 2019-11-23 (×3): qty 1

## 2019-11-23 NOTE — Plan of Care (Signed)

## 2019-11-23 NOTE — Progress Notes (Signed)
PROGRESS NOTE                                                                                                                                                                                                             Patient Demographics:    Ann Spencer, is a 78 y.o. female, DOB - 04/18/1941, [id number]  Admit date - 11/22/2019   Admitting Physician Gwynne Edinger, MD  Outpatient Primary MD for the patient is Leone Haven, MD  LOS - 1  Outpatient Specialists: orthopedics/ vascular surgery  Chief Complaint  Patient presents with  . Recurrent Skin Infections    bilateral       Brief Narrative   78 year old female with chronic lymphedema of lower extremities, hypertension, recent total knee arthroplasty of the left knee presenting with increased redness and pain over bilateral lower leg.  Patient follows with vascular surgeon for her lymphedema and is on a lymphedema pump which she has not been using since her left knee arthroplasty 3 weeks back.  Patient had 1 week of increased redness and swelling of lower leg for which she saw orthopedics and was started on Bactrim however symptoms continue to worsen and came to the ED. Admitted for bilateral lower leg cellulitis with failed outpatient treatment.   Subjective:   Complains of pain in bilateral legs.  Does not want to take narcotics.   Assessment  & Plan :    Principal Problem:   Cellulitis of lower extremity, bilateral Failed outpatient Bactrim.  Continue empiric IV vancomycin.  Keep leg elevated.  Pain control with Tylenol and as needed IV Toradol.  Refuses narcotics.  Blood cultures so far negative.  Doppler lower extremities negative for DVT.   Active Problems:   Essential hypertension Stable.  Continue home meds  Recent left total knee arthroplasty Recently saw orthopedics as outpatient and seems to be healing well..  Continue  gabapentin  Chronic lymphedema Resume lymphedema pump once acute infection resolved.       Code Status : Full code  Family Communication  : None  Disposition Plan  : Home once clinically improved, possibly in the next 48 hours  Barriers For Discharge :   Consults  : None  Procedures  : Doppler lower extremities  DVT Prophylaxis  : Subcu Lovenox Lab Results  Component Value Date  PLT 206 11/23/2019    Antibiotics  :    Anti-infectives (From admission, onward)   Start     Dose/Rate Route Frequency Ordered Stop   11/23/19 2100  vancomycin (VANCOCIN) IVPB 1000 mg/200 mL premix     1,000 mg 200 mL/hr over 60 Minutes Intravenous Every 24 hours 11/23/19 0459     11/22/19 2130  vancomycin (VANCOCIN) IVPB 1000 mg/200 mL premix     1,000 mg 200 mL/hr over 60 Minutes Intravenous  Once 11/22/19 2129 11/22/19 2342   11/22/19 2130  vancomycin (VANCOCIN) 1,500 mg in sodium chloride 0.9 % 500 mL IVPB     1,500 mg 250 mL/hr over 120 Minutes Intravenous  Once 11/22/19 2129 11/23/19 0147   11/22/19 2100  cefTRIAXone (ROCEPHIN) 1 g in sodium chloride 0.9 % 100 mL IVPB     1 g 200 mL/hr over 30 Minutes Intravenous  Once 11/22/19 2059 11/22/19 2221        Objective:   Vitals:   11/22/19 2014 11/22/19 2146 11/22/19 2347 11/23/19 0125  BP: 139/75 135/60 117/62 (!) 149/76  Pulse: 88 98 97 91  Resp: 16 16 17 17   Temp: 98.3 F (36.8 C)   98.1 F (36.7 C)  TempSrc: Oral   Oral  SpO2: 100% 96% 95% 100%  Weight:      Height:        Wt Readings from Last 3 Encounters:  11/22/19 109.3 kg  11/22/19 109.3 kg  11/06/19 113.9 kg     Intake/Output Summary (Last 24 hours) at 11/23/2019 1424 Last data filed at 11/22/2019 2342 Gross per 24 hour  Intake 300 ml  Output --  Net 300 ml     Physical Exam  Gen: In some distress with pain HEENT: moist mucosa, supple neck Chest: clear b/l, no added sounds CVS: N S1&S2, no murmurs,  GI: soft, NT, ND,  Musculoskeletal: Warmth  with swelling and tenderness over bilateral anterior leg, left TKA site is clean     Data Review:    CBC Recent Labs  Lab 11/22/19 1542 11/23/19 0604  WBC 6.4 4.1  HGB 13.2 11.8*  HCT 38.4 35.9*  PLT 216 206  MCV 95.5 98.4  MCH 32.8 32.3  MCHC 34.4 32.9  RDW 11.8 11.7  LYMPHSABS 0.5*  --   MONOABS 0.5  --   EOSABS 0.1  --   BASOSABS 0.0  --     Chemistries  Recent Labs  Lab 11/22/19 1542 11/23/19 0604  NA 134*  --   K 4.0  --   CL 96*  --   CO2 26  --   GLUCOSE 123*  --   BUN 17  --   CREATININE 1.01* 0.88  CALCIUM 9.3  --   AST 28  --   ALT 32  --   ALKPHOS 87  --   BILITOT 0.9  --    ------------------------------------------------------------------------------------------------------------------ No results for input(s): CHOL, HDL, LDLCALC, TRIG, CHOLHDL, LDLDIRECT in the last 72 hours.  Lab Results  Component Value Date   HGBA1C 5.5 10/28/2019   ------------------------------------------------------------------------------------------------------------------ No results for input(s): TSH, T4TOTAL, T3FREE, THYROIDAB in the last 72 hours.  Invalid input(s): FREET3 ------------------------------------------------------------------------------------------------------------------ No results for input(s): VITAMINB12, FOLATE, FERRITIN, TIBC, IRON, RETICCTPCT in the last 72 hours.  Coagulation profile No results for input(s): INR, PROTIME in the last 168 hours.  No results for input(s): DDIMER in the last 72 hours.  Cardiac Enzymes No results for input(s): CKMB, TROPONINI, MYOGLOBIN in the  last 168 hours.  Invalid input(s): CK ------------------------------------------------------------------------------------------------------------------    Component Value Date/Time   BNP 51 02/14/2014 1329    Inpatient Medications  Scheduled Meds: . aspirin  81 mg Oral Daily  . docusate sodium  100 mg Oral BID  . enoxaparin (LOVENOX) injection  40 mg  Subcutaneous Q24H  . gabapentin  300 mg Oral TID  . hydrochlorothiazide  25 mg Oral Daily  . pantoprazole  40 mg Oral Daily  . sodium chloride flush  3 mL Intravenous Q12H   Continuous Infusions: . sodium chloride    . vancomycin     PRN Meds:.sodium chloride, ketorolac, sodium chloride flush  Micro Results Recent Results (from the past 240 hour(s))  Culture, blood (routine x 2)     Status: None (Preliminary result)   Collection Time: 11/22/19  8:33 PM   Specimen: BLOOD  Result Value Ref Range Status   Specimen Description BLOOD RIGHT ANTECUBITAL  Final   Special Requests   Final    BOTTLES DRAWN AEROBIC AND ANAEROBIC Blood Culture adequate volume   Culture   Final    NO GROWTH < 12 HOURS Performed at [facility], [address], [place], [place] [address]    Report Status PENDING  Incomplete  Culture, blood (routine x 2)     Status: None (Preliminary result)   Collection Time: 11/22/19  9:08 PM   Specimen: BLOOD  Result Value Ref Range Status   Specimen Description BLOOD RIGHT ANTECUBITAL  Final   Special Requests   Final    BOTTLES DRAWN AEROBIC AND ANAEROBIC Blood Culture adequate volume   Culture   Final    NO GROWTH < 12 HOURS Performed at [facility], [address] [place], [place] [address]    Report Status PENDING  Incomplete  SARS CORONAVIRUS 2 (TAT 6-24 HRS) Nasopharyngeal Nasopharyngeal Swab     Status: None   Collection Time: 11/22/19  9:33 PM   Specimen: Nasopharyngeal Swab  Result Value Ref Range Status   SARS Coronavirus 2 NEGATIVE NEGATIVE Final    Comment: (NOTE) SARS-CoV-2 target nucleic acids are NOT DETECTED. The SARS-CoV-2 RNA is generally detectable in upper and lower respiratory specimens during the acute phase of infection. Negative results do not preclude SARS-CoV-2 infection, do not rule out co-infections with other pathogens, and should not be used as the sole basis for treatment or other patient management  decisions. Negative results must be combined with clinical observations, patient history, and epidemiological information. The expected result is Negative. Fact Sheet for Patients: SugarRoll.be Fact Sheet for Healthcare Providers: https://www.woods-mathews.com/ This test is not yet approved or cleared by the [place] FDA and  has been authorized for detection and/or diagnosis of SARS-CoV-2 by FDA under an Emergency Use Authorization (EUA). This EUA will remain  in effect (meaning this test can be used) for the duration of the COVID-19 declaration under Section 56 4(b)(1) of the Act, 21 U.S.C. section 360bbb-3(b)(1), unless the authorization is terminated or revoked sooner. Performed at [facility], [place] [address] [place], [place] [address]     Radiology Reports Chest 2 View  Result Date: 10/28/2019 CLINICAL DATA:  78 year old female with preop chest radiograph. EXAM: CHEST - 2 VIEW COMPARISON:  Chest radiograph dated 04/17/2018 FINDINGS: Minimal bibasilar atelectasis. No focal consolidation, pleural effusion, or pneumothorax. The cardiac silhouette is within normal limits. Stable hiatal hernia. No acute osseous pathology. IMPRESSION: No active cardiopulmonary disease. Electronically Signed   By: Laren Everts.D.  On: 10/28/2019 15:45   US Venous Img Lower Bilateral (DVT)  Result Date: 11/23/2019 CLINICAL DATA:  Redness and swelling in the legs, history of prior left knee replacement EXAM: BILATERAL LOWER EXTREMITY VENOUS DOPPLER ULTRASOUND TECHNIQUE: Gray-scale sonography with graded compression, as well as color Doppler and duplex ultrasound were performed to evaluate the lower extremity deep venous systems from the level of the common femoral vein and including the common femoral, femoral, profunda femoral, popliteal and calf veins including the posterior tibial, peroneal and gastrocnemius veins when visible. The superficial  great saphenous vein was also interrogated. Spectral Doppler was utilized to evaluate flow at rest and with distal augmentation maneuvers in the common femoral, femoral and popliteal veins. COMPARISON:  None. FINDINGS: RIGHT LOWER EXTREMITY Common Femoral Vein: No evidence of thrombus. Normal compressibility, respiratory phasicity and response to augmentation. Saphenofemoral Junction: No evidence of thrombus. Normal compressibility and flow on color Doppler imaging. Profunda Femoral Vein: No evidence of thrombus. Normal compressibility and flow on color Doppler imaging. Femoral Vein: No evidence of thrombus. Normal compressibility, respiratory phasicity and response to augmentation. Popliteal Vein: No evidence of thrombus. Normal compressibility, respiratory phasicity and response to augmentation. Calf Veins: No evidence of thrombus. Normal compressibility and flow on color Doppler imaging. Superficial Great Saphenous Vein: No evidence of thrombus. Normal compressibility. Venous Reflux:  None. Other Findings:  Mild lower extremity edema is noted. LEFT LOWER EXTREMITY Common Femoral Vein: No evidence of thrombus. Normal compressibility, respiratory phasicity and response to augmentation. Saphenofemoral Junction: No evidence of thrombus. Normal compressibility and flow on color Doppler imaging. Profunda Femoral Vein: No evidence of thrombus. Normal compressibility and flow on color Doppler imaging. Femoral Vein: No evidence of thrombus. Normal compressibility, respiratory phasicity and response to augmentation. Popliteal Vein: No evidence of thrombus. Normal compressibility, respiratory phasicity and response to augmentation. Calf Veins: No evidence of thrombus. Normal compressibility and flow on color Doppler imaging. Superficial Great Saphenous Vein: No evidence of thrombus. Normal compressibility. Venous Reflux:  None. Other Findings:  Mild lower extremity edema is noted. IMPRESSION: No evidence of deep venous  thrombosis in either lower extremity. Electronically Signed   By: Inez Catalina M.D.   On: 11/23/2019 01:08   DG Knee Left Port  Result Date: 11/05/2019 CLINICAL DATA:  Knee replacement EXAM: PORTABLE LEFT KNEE - 1-2 VIEW COMPARISON:  None FINDINGS: Post total left knee arthroplasty. Postoperative air and soft tissue swelling present. No evidence of complication. IMPRESSION: Standard appearance post total left knee arthroplasty. Electronically Signed   By: Macy Mis M.D.   On: 11/05/2019 11:11    Time Spent in minutes  35  Kori Goins M.D on 11/23/2019 at 2:24 PM  Between 7am to 7pm - Pager - [phone]  After 7pm go to [email or web address] - password [facility]  [facility] -  Office  [phone]

## 2019-11-23 NOTE — Progress Notes (Signed)
Pharmacy Antibiotic Note  Ann Spencer is a 78 y.o. female admitted on 11/22/2019 with cellulitis.  Pharmacy has been consulted for Vancomycin dosing.  Pt received initial dose (loading dose) in ED  Plan: Vancomycin 1000 mg IV Q 24 hrs. Goal AUC 400-550. Expected AUC: 466.3 SCr used: 1.01   Height: 5\' 5"  (165.1 cm) Weight: 241 lb (109.3 kg) IBW/kg (Calculated) : 57  Temp (24hrs), Avg:98.2 F (36.8 C), Min:98.1 F (36.7 C), Max:98.3 F (36.8 C)  Recent Labs  Lab 11/22/19 1542  WBC 6.4  CREATININE 1.01*  LATICACIDVEN 1.1    Estimated Creatinine Clearance: 57.4 mL/min (A) (by C-G formula based on SCr of 1.01 mg/dL (H)).    Allergies  Allergen Reactions  . Penicillins     Did it involve swelling of the face/tongue/throat, SOB, or low BP? Unknown Did it involve sudden or severe rash/hives, skin peeling, or any reaction on the inside of your mouth or nose? Unknown Did you need to seek medical attention at a hospital or doctor's office? Unknown When did it last happen? Childhood allergy If all above answers are "NO", may proceed with cephalosporin use.    Antimicrobials this admission: Rocephin 12/11 x 1 Vancomycin 12/11 >>   Dose adjustments this admission:   Microbiology results:  BCx:   UCx:    Sputum:    MRSA PCR:   Thank you for allowing pharmacy to be a part of this patient's care.  Hart Robinsons A 11/23/2019 5:00 AM

## 2019-11-23 NOTE — ED Notes (Signed)
[place] arrived to room as the pt was being prepared for transport to the floor. [place] requested to scan pt before being taken to her assigned room and [place] is now in progress at this time. [facility], accepting 1A RN, made aware of delay and pt will be transported to inpatient unit when [place] procedure is complete.

## 2019-11-24 DIAGNOSIS — M7989 Other specified soft tissue disorders: Secondary | ICD-10-CM

## 2019-11-24 LAB — CREATININE, SERUM
Creatinine, Ser: 0.93 mg/dL (ref 0.44–1.00)
GFR calc Af Amer: >60 mL/min (ref >60)
GFR calc non Af Amer: 59 mL/min — ABNORMAL LOW (ref >60)

## 2019-11-24 NOTE — Progress Notes (Signed)
PROGRESS NOTE                                                                                                                                                                                                             Patient Demographics:    Ann Spencer, is a 78 y.o. female, DOB - 23-Nov-1941, [id number]  Admit date - 11/22/2019   Admitting Physician Gwynne Edinger, MD  Outpatient Primary MD for the patient is Leone Haven, MD  LOS - 2  Outpatient Specialists: orthopedics/ vascular surgery  Chief Complaint  Patient presents with  . Recurrent Skin Infections    bilateral       Brief Narrative   78 year old female with chronic lymphedema of lower extremities, hypertension, recent total knee arthroplasty of the left knee presenting with increased redness and pain over bilateral lower leg.  Patient follows with vascular surgeon for her lymphedema and is on a lymphedema pump which she has not been using since her left knee arthroplasty 3 weeks back.  Patient had 1 week of increased redness and swelling of lower leg for which she saw orthopedics and was started on Bactrim however symptoms continue to worsen and came to the ED. Admitted for bilateral lower leg cellulitis with failed outpatient treatment.   Subjective:   Leg pain and swelling improving.  Afebrile   Assessment  & Plan :    Principal Problem:   Cellulitis of lower extremity, bilateral Failed outpatient Bactrim.  Continue empiric IV vancomycin.  Keep leg elevated.  Pain control with Tylenol and as needed IV Toradol.  Refuses narcotics.  Blood cultures so far negative.  Doppler lower extremities negative for DVT.  Pain and swelling of the legs slowly improving.  If continues to get better may discharge home on oral antibiotics tomorrow.   Active Problems:   Essential hypertension Stable.  Continue home meds  Recent left total knee  arthroplasty Recently saw orthopedics as outpatient and seems to be healing well..  Continue gabapentin  Chronic lymphedema Resume lymphedema pump once acute infection resolved.       Code Status : Full code  Family Communication  : None  Disposition Plan  : Home possibly tomorrow if cellulitis continues to improve  Barriers For Discharge :   Consults  : None  Procedures  : Doppler lower extremities  DVT Prophylaxis  : Subcu Lovenox Lab Results  Component Value Date   PLT 206 11/23/2019    Antibiotics  :    Anti-infectives (From admission, onward)   Start     Dose/Rate Route Frequency Ordered Stop   11/23/19 2100  vancomycin (VANCOCIN) IVPB 1000 mg/200 mL premix     1,000 mg 200 mL/hr over 60 Minutes Intravenous Every 24 hours 11/23/19 0459     11/22/19 2130  vancomycin (VANCOCIN) IVPB 1000 mg/200 mL premix     1,000 mg 200 mL/hr over 60 Minutes Intravenous  Once 11/22/19 2129 11/22/19 2342   11/22/19 2130  vancomycin (VANCOCIN) 1,500 mg in sodium chloride 0.9 % 500 mL IVPB     1,500 mg 250 mL/hr over 120 Minutes Intravenous  Once 11/22/19 2129 11/23/19 0147   11/22/19 2100  cefTRIAXone (ROCEPHIN) 1 g in sodium chloride 0.9 % 100 mL IVPB     1 g 200 mL/hr over 30 Minutes Intravenous  Once 11/22/19 2059 11/22/19 2221        Objective:   Vitals:   11/23/19 0125 11/23/19 1652 11/24/19 0050 11/24/19 0909  BP: (!) 149/76 119/70 106/60 124/73  Pulse: 91 85 78 77  Resp: 17 17 16 18   Temp: 98.1 F (36.7 C) 98.1 F (36.7 C) (!) 97.5 F (36.4 C) 97.6 F (36.4 C)  TempSrc: Oral Oral Oral Oral  SpO2: 100% 95% 95% 97%  Weight:      Height:        Wt Readings from Last 3 Encounters:  11/22/19 109.3 kg  11/22/19 109.3 kg  11/06/19 113.9 kg     Intake/Output Summary (Last 24 hours) at 11/24/2019 1223 Last data filed at 11/24/2019 0300 Gross per 24 hour  Intake 683 ml  Output --  Net 683 ml    Physical exam Not in distress HEENT: Moist mucosa, supple  neck System clear CVs: Normal S1-S2 GI: Soft, nontender, nondistended Musculoskeletal: Warmth with swelling and tenderness over bilateral anterior leg (improving from yesterday),recent  left TKA site clean     Data Review:    CBC Recent Labs  Lab 11/22/19 1542 11/23/19 0604  WBC 6.4 4.1  HGB 13.2 11.8*  HCT 38.4 35.9*  PLT 216 206  MCV 95.5 98.4  MCH 32.8 32.3  MCHC 34.4 32.9  RDW 11.8 11.7  LYMPHSABS 0.5*  --   MONOABS 0.5  --   EOSABS 0.1  --   BASOSABS 0.0  --     Chemistries  Recent Labs  Lab 11/22/19 1542 11/23/19 0604 11/24/19 0332  NA 134*  --   --   K 4.0  --   --   CL 96*  --   --   CO2 26  --   --   GLUCOSE 123*  --   --   BUN 17  --   --   CREATININE 1.01* 0.88 0.93  CALCIUM 9.3  --   --   AST 28  --   --   ALT 32  --   --   ALKPHOS 87  --   --   BILITOT 0.9  --   --    ------------------------------------------------------------------------------------------------------------------ No results for input(s): CHOL, HDL, LDLCALC, TRIG, CHOLHDL, LDLDIRECT in the last 72 hours.  Lab Results  Component Value Date   HGBA1C 5.5 10/28/2019   ------------------------------------------------------------------------------------------------------------------ No results for input(s): TSH, T4TOTAL, T3FREE, THYROIDAB in the last 72 hours.  Invalid input(s): FREET3 ------------------------------------------------------------------------------------------------------------------ No results for input(s): VITAMINB12,  FOLATE, FERRITIN, TIBC, IRON, RETICCTPCT in the last 72 hours.  Coagulation profile No results for input(s): INR, PROTIME in the last 168 hours.  No results for input(s): DDIMER in the last 72 hours.  Cardiac Enzymes No results for input(s): CKMB, TROPONINI, MYOGLOBIN in the last 168 hours.  Invalid input(s): CK ------------------------------------------------------------------------------------------------------------------    Component  Value Date/Time   BNP 51 02/14/2014 1329    Inpatient Medications  Scheduled Meds: . aspirin  81 mg Oral Daily  . docusate sodium  100 mg Oral BID  . enoxaparin (LOVENOX) injection  40 mg Subcutaneous Q24H  . gabapentin  300 mg Oral TID  . hydrochlorothiazide  25 mg Oral Daily  . pantoprazole  40 mg Oral Daily  . sodium chloride flush  3 mL Intravenous Q12H   Continuous Infusions: . sodium chloride    . vancomycin Stopped (11/23/19 2243)   PRN Meds:.sodium chloride, ketorolac, sodium chloride flush  Micro Results Recent Results (from the past 240 hour(s))  Culture, blood (routine x 2)     Status: None (Preliminary result)   Collection Time: 11/22/19  8:33 PM   Specimen: BLOOD  Result Value Ref Range Status   Specimen Description BLOOD RIGHT ANTECUBITAL  Final   Special Requests   Final    BOTTLES DRAWN AEROBIC AND ANAEROBIC Blood Culture adequate volume   Culture   Final    NO GROWTH 2 DAYS Performed at [facility], [address], [place], [place] [address]    Report Status PENDING  Incomplete  Culture, blood (routine x 2)     Status: None (Preliminary result)   Collection Time: 11/22/19  9:08 PM   Specimen: BLOOD  Result Value Ref Range Status   Specimen Description BLOOD RIGHT ANTECUBITAL  Final   Special Requests   Final    BOTTLES DRAWN AEROBIC AND ANAEROBIC Blood Culture adequate volume   Culture   Final    NO GROWTH 2 DAYS Performed at [facility], [address], [place], [place] [address]    Report Status PENDING  Incomplete  SARS CORONAVIRUS 2 (TAT 6-24 HRS) Nasopharyngeal Nasopharyngeal Swab     Status: None   Collection Time: 11/22/19  9:33 PM   Specimen: Nasopharyngeal Swab  Result Value Ref Range Status   SARS Coronavirus 2 NEGATIVE NEGATIVE Final    Comment: (NOTE) SARS-CoV-2 target nucleic acids are NOT DETECTED. The SARS-CoV-2 RNA is generally detectable in upper and lower respiratory specimens during the acute phase of  infection. Negative results do not preclude SARS-CoV-2 infection, do not rule out co-infections with other pathogens, and should not be used as the sole basis for treatment or other patient management decisions. Negative results must be combined with clinical observations, patient history, and epidemiological information. The expected result is Negative. Fact Sheet for Patients: SugarRoll.be Fact Sheet for Healthcare Providers: https://www.woods-mathews.com/ This test is not yet approved or cleared by the [place] FDA and  has been authorized for detection and/or diagnosis of SARS-CoV-2 by FDA under an Emergency Use Authorization (EUA). This EUA will remain  in effect (meaning this test can be used) for the duration of the COVID-19 declaration under Section 56 4(b)(1) of the Act, 21 U.S.C. section 360bbb-3(b)(1), unless the authorization is terminated or revoked sooner. Performed at [facility], [place] [address], [place], [place] [address]     Radiology Reports Chest 2 View  Result Date: 10/28/2019 CLINICAL DATA:  78 year old female with preop chest radiograph. EXAM: CHEST - 2  VIEW COMPARISON:  Chest radiograph dated 04/17/2018 FINDINGS: Minimal bibasilar atelectasis. No focal consolidation, pleural effusion, or pneumothorax. The cardiac silhouette is within normal limits. Stable hiatal hernia. No acute osseous pathology. IMPRESSION: No active cardiopulmonary disease. Electronically Signed   By: Anner Crete M.D.   On: 10/28/2019 15:45   US Venous Img Lower Bilateral (DVT)  Result Date: 11/23/2019 CLINICAL DATA:  Redness and swelling in the legs, history of prior left knee replacement EXAM: BILATERAL LOWER EXTREMITY VENOUS DOPPLER ULTRASOUND TECHNIQUE: Gray-scale sonography with graded compression, as well as color Doppler and duplex ultrasound were performed to evaluate the lower extremity deep venous systems from the level of the  common femoral vein and including the common femoral, femoral, profunda femoral, popliteal and calf veins including the posterior tibial, peroneal and gastrocnemius veins when visible. The superficial great saphenous vein was also interrogated. Spectral Doppler was utilized to evaluate flow at rest and with distal augmentation maneuvers in the common femoral, femoral and popliteal veins. COMPARISON:  None. FINDINGS: RIGHT LOWER EXTREMITY Common Femoral Vein: No evidence of thrombus. Normal compressibility, respiratory phasicity and response to augmentation. Saphenofemoral Junction: No evidence of thrombus. Normal compressibility and flow on color Doppler imaging. Profunda Femoral Vein: No evidence of thrombus. Normal compressibility and flow on color Doppler imaging. Femoral Vein: No evidence of thrombus. Normal compressibility, respiratory phasicity and response to augmentation. Popliteal Vein: No evidence of thrombus. Normal compressibility, respiratory phasicity and response to augmentation. Calf Veins: No evidence of thrombus. Normal compressibility and flow on color Doppler imaging. Superficial Great Saphenous Vein: No evidence of thrombus. Normal compressibility. Venous Reflux:  None. Other Findings:  Mild lower extremity edema is noted. LEFT LOWER EXTREMITY Common Femoral Vein: No evidence of thrombus. Normal compressibility, respiratory phasicity and response to augmentation. Saphenofemoral Junction: No evidence of thrombus. Normal compressibility and flow on color Doppler imaging. Profunda Femoral Vein: No evidence of thrombus. Normal compressibility and flow on color Doppler imaging. Femoral Vein: No evidence of thrombus. Normal compressibility, respiratory phasicity and response to augmentation. Popliteal Vein: No evidence of thrombus. Normal compressibility, respiratory phasicity and response to augmentation. Calf Veins: No evidence of thrombus. Normal compressibility and flow on color Doppler imaging.  Superficial Great Saphenous Vein: No evidence of thrombus. Normal compressibility. Venous Reflux:  None. Other Findings:  Mild lower extremity edema is noted. IMPRESSION: No evidence of deep venous thrombosis in either lower extremity. Electronically Signed   By: Inez Catalina M.D.   On: 11/23/2019 01:08   DG Knee Left Port  Result Date: 11/05/2019 CLINICAL DATA:  Knee replacement EXAM: PORTABLE LEFT KNEE - 1-2 VIEW COMPARISON:  None FINDINGS: Post total left knee arthroplasty. Postoperative air and soft tissue swelling present. No evidence of complication. IMPRESSION: Standard appearance post total left knee arthroplasty. Electronically Signed   By: Macy Mis M.D.   On: 11/05/2019 11:11    Time Spent in minutes  25  Suvi Archuletta M.D on 11/24/2019 at 12:23 PM  Between 7am to 7pm - Pager - [phone]  After 7pm go to [email or web address] - password [facility]  [facility] -  Office  [phone]

## 2019-11-24 NOTE — Plan of Care (Signed)

## 2019-11-25 ENCOUNTER — Telehealth: Payer: Self-pay

## 2019-11-25 LAB — CREATININE, SERUM
Creatinine, Ser: 0.79 mg/dL (ref 0.44–1.00)
GFR calc Af Amer: >60 mL/min (ref >60)
GFR calc non Af Amer: >60 mL/min (ref >60)

## 2019-11-25 MED ORDER — IBUPROFEN 400 MG PO TABS: 400.0000 mg | ORAL_TABLET | Freq: Three times a day (TID) | ORAL | 0 refills | Status: DC | PRN

## 2019-11-25 MED ORDER — DOXYCYCLINE HYCLATE 100 MG PO TABS: 100.0000 mg | ORAL_TABLET | Freq: Two times a day (BID) | ORAL | 0 refills | Status: DC

## 2019-11-25 NOTE — Discharge Instructions (Signed)

## 2019-11-25 NOTE — Discharge Summary (Signed)
Physician Discharge Summary  Ann Spencer [facility] [id number] DOB: 06-17-1941 DOA: 11/22/2019  PCP: Leone Haven, MD  Admit date: 11/22/2019 Discharge date: 11/25/2019  Admitted From: Home Disposition: Home  Recommendations for Outpatient Follow-up:  1. Follow up with PCP in 1-2 weeks and vascular vein clinic 2. Patient will complete total 10-day course of antibiotic after 12/21  Home Health: Resume home health PT prior to admission Equipment/Devices: Walker  Discharge Condition: Fair CODE STATUS: Full code Diet recommendation: Regular    Discharge Diagnoses:  Principal Problem:   Cellulitis of lower extremity, bilateral  Active Problems:   Essential hypertension   Atherosclerotic peripheral vascular disease (HCC)   Lymphedema   S/P TKR (total knee replacement) using cement, left  Brief narrative/HPI 77 year old female with chronic lymphedema of lower extremities, hypertension, recent total knee arthroplasty of the left knee presenting with increased redness and pain over bilateral lower leg.  Patient follows with vascular surgeon for her lymphedema and is on a lymphedema pump which she has not been using since her left knee arthroplasty 3 weeks back.  Patient had 1 week of increased redness and swelling of lower leg for which she saw orthopedics and was started on Bactrim however symptoms continue to worsen and came to the ED. Admitted for bilateral lower leg cellulitis with failed outpatient treatment.   Principal Problem:   Cellulitis of lower extremity, bilateral Failed outpatient Bactrim.    Received empiric IV vancomycin with significant improvement..    Pain well controlled with Tylenol and as needed Toradol.  Patient refused any narcotics.  Blood cultures negative. Doppler lower extremities negative for DVT.  Pain and swelling of the legs have significantly improved.  Patient stable to be discharged home on oral doxycycline to complete total 10-day course of  antibiotics.  She should follow-up with her PCP and vein clinic.  Home health instructions provided.   Active Problems:   Essential hypertension Stable.  Continue home meds  Recent left total knee arthroplasty Recently saw orthopedics as outpatient and seems to be healing well..  Continue gabapentin  Chronic lymphedema Follow-up with the vein clinic regarding her lymphedema pump upon discharge.      Family Communication  :  Discussed with daughter on the phone  Disposition Plan  : Home   Consults  : None  Procedures  : Doppler lower extremities`   Discharge Instructions   Allergies as of 11/25/2019      Reactions   Penicillins    Did it involve swelling of the face/tongue/throat, SOB, or low BP? Unknown Did it involve sudden or severe rash/hives, skin peeling, or any reaction on the inside of your mouth or nose? Unknown Did you need to seek medical attention at a hospital or doctor's office? Unknown When did it last happen? Childhood allergy If all above answers are "NO", may proceed with cephalosporin use.      Medication List    STOP taking these medications   oxyCODONE 5 MG immediate release tablet Commonly known as: Oxy IR/ROXICODONE     TAKE these medications   aspirin 81 MG tablet Take 81 mg by mouth daily.   bisacodyl 5 MG EC tablet Commonly known as: DULCOLAX Take 2 tablets (10 mg total) by mouth daily as needed for moderate constipation.   D3 Adult 25 MCG (1000 UT) Chew Generic drug: Cholecalciferol Chew 1,000 Units by mouth daily.   docusate sodium 100 MG capsule Commonly known as: COLACE Take 1 capsule (100 mg total) by mouth 2 (  two) times daily.   doxycycline 100 MG tablet Commonly known as: VIBRA-TABS Take 1 tablet (100 mg total) by mouth 2 (two) times daily.   enoxaparin 40 MG/0.4ML injection Commonly known as: LOVENOX Inject 0.4 mLs (40 mg total) into the skin daily for 14 days.   ferrous sulfate 325 (65 FE) MG EC  tablet Take 325 mg by mouth daily with breakfast.   gabapentin 300 MG capsule Commonly known as: NEURONTIN Take 1 capsule (300 mg total) by mouth 3 (three) times daily.   hydrochlorothiazide 25 MG tablet Commonly known as: HYDRODIURIL Take 1 tablet by mouth once daily   ibuprofen 400 MG tablet Commonly known as: ADVIL Take 1 tablet (400 mg total) by mouth every 8 (eight) hours as needed.   multivitamin capsule Take 1 capsule by mouth daily.   omeprazole 20 MG capsule Commonly known as: PRILOSEC Take 20 mg by mouth as needed.   OSTEO BI-FLEX JOINT SHIELD PO Take 1 tablet by mouth daily.      Follow-up Information    Leone Haven, MD. Schedule an appointment as soon as possible for a visit in 1 week(s).   Specialty: Family Medicine Contact information: [address] [place] [address] [phone]          Allergies  Allergen Reactions  . Penicillins     Did it involve swelling of the face/tongue/throat, SOB, or low BP? Unknown Did it involve sudden or severe rash/hives, skin peeling, or any reaction on the inside of your mouth or nose? Unknown Did you need to seek medical attention at a hospital or doctor's office? Unknown When did it last happen? Childhood allergy If all above answers are "NO", may proceed with cephalosporin use.       Procedures/Studies: Chest 2 View  Result Date: 10/28/2019 CLINICAL DATA:  78 year old female with preop chest radiograph. EXAM: CHEST - 2 VIEW COMPARISON:  Chest radiograph dated 04/17/2018 FINDINGS: Minimal bibasilar atelectasis. No focal consolidation, pleural effusion, or pneumothorax. The cardiac silhouette is within normal limits. Stable hiatal hernia. No acute osseous pathology. IMPRESSION: No active cardiopulmonary disease. Electronically Signed   By: Anner Crete M.D.   On: 10/28/2019 15:45   US Venous Img Lower Bilateral (DVT)  Result Date: 11/23/2019 CLINICAL DATA:  Redness and  swelling in the legs, history of prior left knee replacement EXAM: BILATERAL LOWER EXTREMITY VENOUS DOPPLER ULTRASOUND TECHNIQUE: Gray-scale sonography with graded compression, as well as color Doppler and duplex ultrasound were performed to evaluate the lower extremity deep venous systems from the level of the common femoral vein and including the common femoral, femoral, profunda femoral, popliteal and calf veins including the posterior tibial, peroneal and gastrocnemius veins when visible. The superficial great saphenous vein was also interrogated. Spectral Doppler was utilized to evaluate flow at rest and with distal augmentation maneuvers in the common femoral, femoral and popliteal veins. COMPARISON:  None. FINDINGS: RIGHT LOWER EXTREMITY Common Femoral Vein: No evidence of thrombus. Normal compressibility, respiratory phasicity and response to augmentation. Saphenofemoral Junction: No evidence of thrombus. Normal compressibility and flow on color Doppler imaging. Profunda Femoral Vein: No evidence of thrombus. Normal compressibility and flow on color Doppler imaging. Femoral Vein: No evidence of thrombus. Normal compressibility, respiratory phasicity and response to augmentation. Popliteal Vein: No evidence of thrombus. Normal compressibility, respiratory phasicity and response to augmentation. Calf Veins: No evidence of thrombus. Normal compressibility and flow on color Doppler imaging. Superficial Great Saphenous Vein: No evidence of thrombus. Normal compressibility. Venous Reflux:  None. Other Findings:  Mild lower extremity edema is noted. LEFT LOWER EXTREMITY Common Femoral Vein: No evidence of thrombus. Normal compressibility, respiratory phasicity and response to augmentation. Saphenofemoral Junction: No evidence of thrombus. Normal compressibility and flow on color Doppler imaging. Profunda Femoral Vein: No evidence of thrombus. Normal compressibility and flow on color Doppler imaging. Femoral Vein:  No evidence of thrombus. Normal compressibility, respiratory phasicity and response to augmentation. Popliteal Vein: No evidence of thrombus. Normal compressibility, respiratory phasicity and response to augmentation. Calf Veins: No evidence of thrombus. Normal compressibility and flow on color Doppler imaging. Superficial Great Saphenous Vein: No evidence of thrombus. Normal compressibility. Venous Reflux:  None. Other Findings:  Mild lower extremity edema is noted. IMPRESSION: No evidence of deep venous thrombosis in either lower extremity. Electronically Signed   By: Inez Catalina M.D.   On: 11/23/2019 01:08   DG Knee Left Port  Result Date: 11/05/2019 CLINICAL DATA:  Knee replacement EXAM: PORTABLE LEFT KNEE - 1-2 VIEW COMPARISON:  None FINDINGS: Post total left knee arthroplasty. Postoperative air and soft tissue swelling present. No evidence of complication. IMPRESSION: Standard appearance post total left knee arthroplasty. Electronically Signed   By: Macy Mis M.D.   On: 11/05/2019 11:11      Subjective: Denies any pain in her legs.  Continues to feel better  Discharge Exam: Vitals:   11/25/19 0740 11/25/19 0753  BP: 131/78 131/64  Pulse: 71 72  Resp: 16 17  Temp: 97.7 F (36.5 C) 97.7 F (36.5 C)  SpO2: 94% 92%   Vitals:   11/24/19 1606 11/24/19 2318 11/25/19 0740 11/25/19 0753  BP: 125/69 129/69 131/78 131/64  Pulse: 73 89 71 72  Resp: 18 16 16 17   Temp: 97.6 F (36.4 C) 98.1 F (36.7 C) 97.7 F (36.5 C) 97.7 F (36.5 C)  TempSrc: Oral Oral Oral Oral  SpO2: 98% 95% 94% 92%  Weight:      Height:        General: Elderly female not in distress HEENT: Moist mucosa, supple neck Chest: Clear CVs: Normal S1-S2 GI: Soft, nondistended nontender Musculoskeletal: Markedly improved erythema and swelling of bilateral legs, nontender, left TKA site appears clean    The results of significant diagnostics from this hospitalization (including imaging, microbiology,  ancillary and laboratory) are listed below for reference.     Microbiology: Recent Results (from the past 240 hour(s))  Culture, blood (routine x 2)     Status: None (Preliminary result)   Collection Time: 11/22/19  8:33 PM   Specimen: BLOOD  Result Value Ref Range Status   Specimen Description BLOOD RIGHT ANTECUBITAL  Final   Special Requests   Final    BOTTLES DRAWN AEROBIC AND ANAEROBIC Blood Culture adequate volume   Culture   Final    NO GROWTH 3 DAYS Performed at [facility], [address] [place], [place] [address]    Report Status PENDING  Incomplete  Culture, blood (routine x 2)     Status: None (Preliminary result)   Collection Time: 11/22/19  9:08 PM   Specimen: BLOOD  Result Value Ref Range Status   Specimen Description BLOOD RIGHT ANTECUBITAL  Final   Special Requests   Final    BOTTLES DRAWN AEROBIC AND ANAEROBIC Blood Culture adequate volume   Culture   Final    NO GROWTH 3 DAYS Performed at [facility], [address] [place], [place] [address]    Report Status PENDING  Incomplete  SARS CORONAVIRUS  2 (TAT 6-24 HRS) Nasopharyngeal Nasopharyngeal Swab     Status: None   Collection Time: 11/22/19  9:33 PM   Specimen: Nasopharyngeal Swab  Result Value Ref Range Status   SARS Coronavirus 2 NEGATIVE NEGATIVE Final    Comment: (NOTE) SARS-CoV-2 target nucleic acids are NOT DETECTED. The SARS-CoV-2 RNA is generally detectable in upper and lower respiratory specimens during the acute phase of infection. Negative results do not preclude SARS-CoV-2 infection, do not rule out co-infections with other pathogens, and should not be used as the sole basis for treatment or other patient management decisions. Negative results must be combined with clinical observations, patient history, and epidemiological information. The expected result is Negative. Fact Sheet for Patients: SugarRoll.be Fact Sheet for Healthcare  Providers: https://www.woods-mathews.com/ This test is not yet approved or cleared by the [place] FDA and  has been authorized for detection and/or diagnosis of SARS-CoV-2 by FDA under an Emergency Use Authorization (EUA). This EUA will remain  in effect (meaning this test can be used) for the duration of the COVID-19 declaration under Section 56 4(b)(1) of the Act, 21 U.S.C. section 360bbb-3(b)(1), unless the authorization is terminated or revoked sooner. Performed at [facility], [place] [address] [place], [place] [address]      Labs: BNP (last 3 results) No results for input(s): BNP in the last 8760 hours. Basic Metabolic Panel: Recent Labs  Lab 11/22/19 1542 11/23/19 0604 11/24/19 0332 11/25/19 0500  NA 134*  --   --   --   K 4.0  --   --   --   CL 96*  --   --   --   CO2 26  --   --   --   GLUCOSE 123*  --   --   --   BUN 17  --   --   --   CREATININE 1.01* 0.88 0.93 0.79  CALCIUM 9.3  --   --   --    Liver Function Tests: Recent Labs  Lab 11/22/19 1542  AST 28  ALT 32  ALKPHOS 87  BILITOT 0.9  PROT 7.4  ALBUMIN 3.7   No results for input(s): LIPASE, AMYLASE in the last 168 hours. No results for input(s): AMMONIA in the last 168 hours. CBC: Recent Labs  Lab 11/22/19 1542 11/23/19 0604  WBC 6.4 4.1  NEUTROABS 5.2  --   HGB 13.2 11.8*  HCT 38.4 35.9*  MCV 95.5 98.4  PLT 216 206   Cardiac Enzymes: No results for input(s): CKTOTAL, CKMB, CKMBINDEX, TROPONINI in the last 168 hours. BNP: Invalid input(s): POCBNP CBG: No results for input(s): GLUCAP in the last 168 hours. D-Dimer No results for input(s): DDIMER in the last 72 hours. Hgb A1c No results for input(s): HGBA1C in the last 72 hours. Lipid Profile No results for input(s): CHOL, HDL, LDLCALC, TRIG, CHOLHDL, LDLDIRECT in the last 72 hours. Thyroid function studies No results for input(s): TSH, T4TOTAL, T3FREE, THYROIDAB in the last 72 hours.  Invalid input(s):  FREET3 Anemia work up No results for input(s): VITAMINB12, FOLATE, FERRITIN, TIBC, IRON, RETICCTPCT in the last 72 hours. Urinalysis    Component Value Date/Time   COLORURINE YELLOW (A) 11/22/2019 2033   APPEARANCEUR CLEAR (A) 11/22/2019 2033   LABSPEC 1.017 11/22/2019 2033   PHURINE 6.0 11/22/2019 2033   GLUCOSEU NEGATIVE 11/22/2019 2033   HGBUR NEGATIVE 11/22/2019 2033   BILIRUBINUR NEGATIVE 11/22/2019 2033   KETONESUR 5 (A) 11/22/2019 2033   PROTEINUR NEGATIVE  11/22/2019 2033   NITRITE NEGATIVE 11/22/2019 2033   LEUKOCYTESUR NEGATIVE 11/22/2019 2033   Sepsis Labs Invalid input(s): PROCALCITONIN,  WBC,  LACTICIDVEN Microbiology Recent Results (from the past 240 hour(s))  Culture, blood (routine x 2)     Status: None (Preliminary result)   Collection Time: 11/22/19  8:33 PM   Specimen: BLOOD  Result Value Ref Range Status   Specimen Description BLOOD RIGHT ANTECUBITAL  Final   Special Requests   Final    BOTTLES DRAWN AEROBIC AND ANAEROBIC Blood Culture adequate volume   Culture   Final    NO GROWTH 3 DAYS Performed at [facility], [address], [place], [place] [address]    Report Status PENDING  Incomplete  Culture, blood (routine x 2)     Status: None (Preliminary result)   Collection Time: 11/22/19  9:08 PM   Specimen: BLOOD  Result Value Ref Range Status   Specimen Description BLOOD RIGHT ANTECUBITAL  Final   Special Requests   Final    BOTTLES DRAWN AEROBIC AND ANAEROBIC Blood Culture adequate volume   Culture   Final    NO GROWTH 3 DAYS Performed at [facility], [address] [place], [place] [address]    Report Status PENDING  Incomplete  SARS CORONAVIRUS 2 (TAT 6-24 HRS) Nasopharyngeal Nasopharyngeal Swab     Status: None   Collection Time: 11/22/19  9:33 PM   Specimen: Nasopharyngeal Swab  Result Value Ref Range Status   SARS Coronavirus 2 NEGATIVE NEGATIVE Final    Comment: (NOTE) SARS-CoV-2 target nucleic acids are NOT  DETECTED. The SARS-CoV-2 RNA is generally detectable in upper and lower respiratory specimens during the acute phase of infection. Negative results do not preclude SARS-CoV-2 infection, do not rule out co-infections with other pathogens, and should not be used as the sole basis for treatment or other patient management decisions. Negative results must be combined with clinical observations, patient history, and epidemiological information. The expected result is Negative. Fact Sheet for Patients: SugarRoll.be Fact Sheet for Healthcare Providers: https://www.woods-mathews.com/ This test is not yet approved or cleared by the [place] FDA and  has been authorized for detection and/or diagnosis of SARS-CoV-2 by FDA under an Emergency Use Authorization (EUA). This EUA will remain  in effect (meaning this test can be used) for the duration of the COVID-19 declaration under Section 56 4(b)(1) of the Act, 21 U.S.C. section 360bbb-3(b)(1), unless the authorization is terminated or revoked sooner. Performed at [facility], [place] [address], [place], [place] [address]      Time coordinating discharge: 35 minutes  SIGNED:   Louellen Molder, MD  [facility] 11/25/2019, 10:30 AM Pager   If 7PM-7AM, please contact night-coverage [email or web address] Password [email or web address]

## 2019-11-25 NOTE — Care Management Important Message (Signed)
Important Message  Patient Details  Name: Ann Spencer MRN: [id number] Date of Birth: January 25, 1941   Medicare Important Message Given:  No  Discharged prior to arrival to unit to deliver concurrent Medicare IM.     Dannette Barbara 11/25/2019, 3:12 PM

## 2019-11-25 NOTE — Plan of Care (Signed)

## 2019-11-25 NOTE — Progress Notes (Signed)
Received  MD order to discharge patient to home, reviewed home meds, prescriptions, discharge instructions and follow up appointments  with patient and patient verbalized understanding

## 2019-11-25 NOTE — Telephone Encounter (Signed)
Transition Care Management Follow-up Telephone Call   Date discharged? 11/25/19   How have you been since you were released from the hospital? Patient states,"I am doing fine." Swelling, redness, pain, weeping to legs have improved." Bandages appropriate.    Do you understand why you were in the hospital? Yes, cellulitis.   Do you understand the discharge instructions? Yes, keep feet elevated when sitting. Elevate feet higher than head when sleeping. Walk as tolerated for exercise.    Where were you discharged to? Home.   Items Reviewed:  Medications reviewed: Yes, taking as scheduled, no concerns. Stop oxycodone 5mg . Added antibiotic doxycycline 100 mg BID.  Allergies reviewed: Yes  Dietary changes reviewed: Yes, regular  Referrals reviewed: Yes HHPT, V[facility] Vein Clinic in 1-2 weeks; antibiotic complete in 10 days.    Functional Questionnaire:   Activities of Daily Living (ADLs):   She states they are independent in the following: Independent in all ADLs. States they require assistance with the following: Ambulates with walker.   Any transportation issues/concerns?: None.    Any patient concerns? None at this time.    Confirmed importance and date/time of follow-up visits scheduled Yes, appointment scheduled via doxy [phone]. Patient states reception may be poor due to home location.   Provider Appointment booked with Dr. Orland Mustard.   PMD schedule does not have availability at this time.   Confirmed with patient if condition begins to worsen call PCP or go to the ER.  Patient was given the office number and encouraged to call back with question or concerns.  : Yes.

## 2019-11-27 ENCOUNTER — Other Ambulatory Visit: Payer: Self-pay

## 2019-11-27 ENCOUNTER — Encounter: Payer: Self-pay | Admitting: Internal Medicine

## 2019-11-27 ENCOUNTER — Ambulatory Visit (INDEPENDENT_AMBULATORY_CARE_PROVIDER_SITE_OTHER): Payer: Medicare Other | Admitting: Internal Medicine

## 2019-11-27 VITALS — BP 126/80 | Ht 65.0 in | Wt 249.0 lb

## 2019-11-27 DIAGNOSIS — I70209 Unspecified atherosclerosis of native arteries of extremities, unspecified extremity: Secondary | ICD-10-CM | POA: Diagnosis not present

## 2019-11-27 DIAGNOSIS — L03119 Cellulitis of unspecified part of limb: Secondary | ICD-10-CM

## 2019-11-27 DIAGNOSIS — I872 Venous insufficiency (chronic) (peripheral): Secondary | ICD-10-CM | POA: Diagnosis not present

## 2019-11-27 DIAGNOSIS — I890 Lymphedema, not elsewhere classified: Secondary | ICD-10-CM | POA: Diagnosis not present

## 2019-11-27 LAB — CULTURE, BLOOD (ROUTINE X 2)
Culture: NO GROWTH
Culture: NO GROWTH
Special Requests: ADEQUATE
Special Requests: ADEQUATE

## 2019-11-27 NOTE — Progress Notes (Signed)
Patient presented today initially for Unna wraps due to reported swelling and leg redness.  Patient reports getting Bactrim from her orthopedic doctor on Tuesday but since she has begun taking the antibiotic the redness and pain has actually gotten worse.  She denies any fevers.  The patient's bilateral lower extremities are extremely erythematous.  These changes are consistent with cellulitis.  Due to the quickly advancing nature of the cellulitis as well as the recent knee replacement, I have recommended that the patient go to the emergency room.  The patient's daughter is present and will drive her there directly.  Patient is advised that after she is discharged from the hospital she continues to have leg swelling she should contact her office and we can proceed with Unna wraps at that time.  Emergency room contacted and triage nurse briefed.

## 2019-11-27 NOTE — Progress Notes (Signed)
Virtual Visit via Video Note  I connected with Ann Spencer   on 11/27/19 at 11:49 AM EST by a video enabled telemedicine application and verified that I am speaking with the correct person using two identifiers.  Location patient: home Location provider:work or home office Persons participating in the virtual visit: patient, provider  I discussed the limitations of evaluation and management by telemedicine and the availability of in person appointments. The patient expressed understanding and agreed to proceed.   HPI: HFU admitted 12/11-12/14 for cellulitis b/l lower extremities failed outpatient Bactrim and given IV vancomycin with improvement and sent home with doxy bid x 10 days and needs f/u with [facility] for chronic lymphedema and PVD legs. She is doing much better leg swelling better but right leg still more swollen and somewhat painful and red compared to left (left leg redness is better than right and overall redness improved) and warmth resolved. [place] b/l legs negative DVT 11/23/2019   She is doing well and still having H/H PT this am and this will be done 11/28/2019 and will have h/h OT this pm 2 pm. She has f/u visit with Dr. Raliegh Ip ortho 12/03/19 and will start PT outpatient at this time. She is able to walk around the house currently slowly with a walker    She reports 11/05/[facility] with [facility] Dr. Raliegh Ip she had left total knee and discharged home 11/09/2019 with home PT/OT but around 12/6 or 12/7 she had redness in both legs, soreness which was new and by 11/21/19 legs were hot/red and swollen and she had trouble walking and went to [facility] for a visit they sent to to the hospital where she waited in the ED for a while and was admitted for above She is drinking at least 56 ounces of water plus other fluid    ROS: See pertinent positives and negatives per HPI.  Past Medical History:  Diagnosis Date  . Allergy   . Anemia   . Arthritis   . Cellulitis   . GERD (gastroesophageal reflux disease)    . History of head injury 11/17/2015  . Hypertension   . PAD (peripheral artery disease) ([place])   . Phlebitis 2014  . Pneumonia due to infectious organism 02/15/2018  . Rotator cuff injury 12/2013    Past Surgical History:  Procedure Laterality Date  . ABDOMINAL HYSTERECTOMY  1981   Full  . COLONOSCOPY    . SHOULDER SURGERY Left 03/03/15  . TOTAL KNEE ARTHROPLASTY Left 11/05/2019   Procedure: TOTAL KNEE ARTHROPLASTY;  Surgeon: Thornton Park, MD;  Location: [facility] ORS;  Service: Orthopedics;  Laterality: Left;  . vein closure Bilateral Feb 2008    Family History  Problem Relation Age of Onset  . Varicose Veins Brother     SOCIAL HX:  [place] Lives with daughter Beverlee Nims) and grandaughter  2 dogs lives inside Enjoys reading  Current Outpatient Medications:  .  aspirin 81 MG tablet, Take 81 mg by mouth daily., Disp: , Rfl:  .  bisacodyl (DULCOLAX) 5 MG EC tablet, Take 2 tablets (10 mg total) by mouth daily as needed for moderate constipation., Disp: 30 tablet, Rfl: 0 .  Cholecalciferol (D3 ADULT) 25 MCG (1000 UT) CHEW, Chew 1,000 Units by mouth daily. , Disp: , Rfl:  .  docusate sodium (COLACE) 100 MG capsule, Take 1 capsule (100 mg total) by mouth 2 (two) times daily., Disp: 10 capsule, Rfl: 0 .  doxycycline (VIBRA-TABS) 100 MG tablet, Take 1 tablet (100 mg  total) by mouth 2 (two) times daily., Disp: 14 tablet, Rfl: 0 .  ferrous sulfate 325 (65 FE) MG EC tablet, Take 325 mg by mouth daily with breakfast., Disp: , Rfl:  .  gabapentin (NEURONTIN) 300 MG capsule, Take 1 capsule (300 mg total) by mouth 3 (three) times daily., Disp: 30 capsule, Rfl: 1 .  hydrochlorothiazide (HYDRODIURIL) 25 MG tablet, Take 1 tablet by mouth once daily, Disp: 90 tablet, Rfl: 1 .  ibuprofen (ADVIL) 400 MG tablet, Take 1 tablet (400 mg total) by mouth every 8 (eight) hours as needed., Disp: 15 tablet, Rfl: 0 .  Misc Natural Products (OSTEO BI-FLEX JOINT SHIELD PO), Take 1 tablet by mouth daily.  , Disp: , Rfl:  .  Multiple Vitamin (MULTIVITAMIN) capsule, Take 1 capsule by mouth daily., Disp: , Rfl:  .  omeprazole (PRILOSEC) 20 MG capsule, Take 20 mg by mouth as needed., Disp: , Rfl:  .  enoxaparin (LOVENOX) 40 MG/0.4ML injection, Inject 0.4 mLs (40 mg total) into the skin daily for 14 days., Disp: 14 mL, Rfl: 1  Current Facility-Administered Medications:  .  lidocaine (PF) (XYLOCAINE) 1 % injection 10 mL, 10 mL, Other, Once, Milinda Pointer, MD  EXAM:  VITALS per patient if applicable:  GENERAL: alert, oriented, appears well and in no acute distress  HEENT: atraumatic, conjunttiva clear, no obvious abnormalities on inspection of external nose and ears  NECK: normal movements of the head and neck  LUNGS: on inspection no signs of respiratory distress, breathing rate appears normal, no obvious gross SOB, gasping or wheezing  CV: no obvious cyanosis  MS: moves all visible extremities without noticeable abnormality  PSYCH/NEURO: pleasant and cooperative, no obvious depression or anxiety, speech and thought processing grossly intact  Skin/lymph: right leg 1-2+ pitting leg edema and left 1+ pitting edema;red brown stasis derm changes to b/l legs   ASSESSMENT AND PLAN:  Discussed the following assessment and plan:  Cellulitis of lower extremity, unspecified laterality -pending unna boot 12/17 and 12/23 with [facility]  -will CC Dr. Delana Meyer to see if he wants to see her sooner than 02/2020 since hosp d/c Complete 10 day course of doxycycline bid she failed bactrim outpatient prior to hosp. Admission   Atherosclerotic peripheral vascular disease ([place]) Venous stasis dermatitis of both lower extremities Chronic lymphedema -f/u [facility]   -rec pt reduce total fluid to no more than 50-56 ounces daily and PCP consider ECHO to check for systolic diastolic dysfunction with h/o HTN and leg edema b/l disc with pt and she can disc with PCP at appt 12/2019   -we discussed possible serious and  likely etiologies, options for evaluation and workup, limitations of telemedicine visit vs in person visit, treatment, treatment risks and precautions. Pt prefers to treat via telemedicine empirically rather then risking or undertaking an in person visit at this moment. Patient agrees to seek prompt in person care if worsening, new symptoms arise, or if is not improving with treatment.   I discussed the assessment and treatment plan with the patient. The patient was provided an opportunity to ask questions and all were answered. The patient agreed with the plan and demonstrated an understanding of the instructions.   The patient was advised to call back or seek an in-person evaluation if the symptoms worsen or if the condition fails to improve as anticipated.  Time spent 20 minutes  Delorise Jackson, MD

## 2019-11-28 ENCOUNTER — Other Ambulatory Visit: Payer: Self-pay

## 2019-11-28 ENCOUNTER — Encounter (INDEPENDENT_AMBULATORY_CARE_PROVIDER_SITE_OTHER): Payer: Self-pay

## 2019-11-28 ENCOUNTER — Encounter (INDEPENDENT_AMBULATORY_CARE_PROVIDER_SITE_OTHER): Payer: Medicare Other

## 2019-11-28 NOTE — Addendum Note (Signed)
Addended by: Orland Mustard on: 11/28/2019 08:54 AM   Modules accepted: Level of Service

## 2019-11-29 ENCOUNTER — Encounter (INDEPENDENT_AMBULATORY_CARE_PROVIDER_SITE_OTHER): Payer: Medicare Other

## 2019-12-02 ENCOUNTER — Other Ambulatory Visit: Payer: Self-pay | Admitting: *Deleted

## 2019-12-02 NOTE — Patient Outreach (Signed)
Red EMMI general discharge for day #4 11/30/19 received on 12/02/19- Other questions, problems?- yes.  Outreach call to pt for follow up, spoke with pt, HIPAA verified, pt reports she has had some problems sleeping recently and has not been taking her melatonin because she has been on pain medication and antibiotics after TKR on 11/05/19.  Pt states she has not taken pain medication in several days and finished antibiotic today and plans to start back on natural melatonin as this helped her before.  Pt has been taking some naps during the day and will try to limit naps to promote night time sleep.  Pt will also plan to speak with her primary MD if continues to have any issues with insomnia.  Jacqlyn Larsen [facility], [place] Coordinator [phone]

## 2019-12-04 ENCOUNTER — Encounter (INDEPENDENT_AMBULATORY_CARE_PROVIDER_SITE_OTHER): Payer: Medicare Other

## 2019-12-10 ENCOUNTER — Ambulatory Visit (INDEPENDENT_AMBULATORY_CARE_PROVIDER_SITE_OTHER): Payer: Medicare Other | Admitting: Nurse Practitioner

## 2019-12-19 DIAGNOSIS — M25562 Pain in left knee: Secondary | ICD-10-CM | POA: Diagnosis not present

## 2019-12-19 DIAGNOSIS — M25662 Stiffness of left knee, not elsewhere classified: Secondary | ICD-10-CM | POA: Diagnosis not present

## 2019-12-25 DIAGNOSIS — Z96652 Presence of left artificial knee joint: Secondary | ICD-10-CM | POA: Diagnosis not present

## 2019-12-26 DIAGNOSIS — M25662 Stiffness of left knee, not elsewhere classified: Secondary | ICD-10-CM | POA: Diagnosis not present

## 2019-12-26 DIAGNOSIS — M25562 Pain in left knee: Secondary | ICD-10-CM | POA: Diagnosis not present

## 2019-12-26 DIAGNOSIS — M542 Cervicalgia: Secondary | ICD-10-CM | POA: Diagnosis not present

## 2019-12-27 ENCOUNTER — Telehealth (INDEPENDENT_AMBULATORY_CARE_PROVIDER_SITE_OTHER): Payer: Self-pay | Admitting: Vascular Surgery

## 2019-12-27 NOTE — Telephone Encounter (Signed)
In this instance because her legs have gotten worse despite starting antibiotics, I would advise urgent care or the emergency room vs. A visit to our office.  This is because generally when someone has started antibiotic therapy for cellulitis and it gets worse despite oral antibiotics, generally the next step is IV antibiotics which we aren't able to provide in the office.  It's also possible that since she has just started the antibiotics they haven't had a change to work yet.  And generally, we would want to see if the antibiotics work before we change anything.  We can see her, however, if her legs are as red as last time or getting worse I will advise returning to the emergency room.  If they are staying the same since she started them, I would advise coming in Monday to see if they've had a change to work. So if the patient would like to be seen we can but there is that possibility we will advise she go to the emergency room or we may just try the wait and watch approach.  Alternatively, we can get her in for Monday to see how the antibiotics started working.

## 2019-12-27 NOTE — Telephone Encounter (Signed)
Patient went to see her Orthopedic surgeon Dr. Joneen Boers (daughter spelled for me) at [facility] yesterday, he saw that her legs were red again and told her to start taking the antibiotic that was prescribed for her initially. Daughter Beverlee Nims wants her seen to keep her legs from getting worse.

## 2019-12-27 NOTE — Telephone Encounter (Signed)
Spoke with the patient and her daughter regarding the recommendation from Eulogio Ditch NP, the patient's daughter Ann Spencer wanted to know if it was normal to get nauseous from taking the antibiotic and I recommended that eating before taking any antibiotics or pain medications would help. See notes below.

## 2019-12-30 DIAGNOSIS — M25562 Pain in left knee: Secondary | ICD-10-CM | POA: Diagnosis not present

## 2019-12-30 DIAGNOSIS — M25662 Stiffness of left knee, not elsewhere classified: Secondary | ICD-10-CM | POA: Diagnosis not present

## 2020-01-02 DIAGNOSIS — M25562 Pain in left knee: Secondary | ICD-10-CM | POA: Diagnosis not present

## 2020-01-02 DIAGNOSIS — M25662 Stiffness of left knee, not elsewhere classified: Secondary | ICD-10-CM | POA: Diagnosis not present

## 2020-01-07 DIAGNOSIS — M25662 Stiffness of left knee, not elsewhere classified: Secondary | ICD-10-CM | POA: Diagnosis not present

## 2020-01-07 DIAGNOSIS — M25562 Pain in left knee: Secondary | ICD-10-CM | POA: Diagnosis not present

## 2020-01-08 ENCOUNTER — Other Ambulatory Visit (INDEPENDENT_AMBULATORY_CARE_PROVIDER_SITE_OTHER): Payer: Self-pay | Admitting: Vascular Surgery

## 2020-01-08 ENCOUNTER — Telehealth (INDEPENDENT_AMBULATORY_CARE_PROVIDER_SITE_OTHER): Payer: Self-pay

## 2020-01-08 DIAGNOSIS — M7989 Other specified soft tissue disorders: Secondary | ICD-10-CM

## 2020-01-08 NOTE — Telephone Encounter (Signed)
This was sent per Dr. Delana Meyer:  Dr. Mack Guise just called apparently [name] had a knee replacement recently and her leg has gotten quite swollen may be weeping a little and he was hoping we could get her in tomorrow for an Unna wrap. She does not need to see a provider tomorrow but lets fit her in next week and also if we could get a DVT ultrasound of the leg before we wrap it that would also help out a lot make sure she does not have a DVT. All of this is weather permitting of course thanks so much! Not sure which leg it is we will have to ask her when she gets there   Patient will be scheduled for a unna wrap and DVT study.

## 2020-01-09 ENCOUNTER — Encounter (INDEPENDENT_AMBULATORY_CARE_PROVIDER_SITE_OTHER): Payer: Self-pay

## 2020-01-09 ENCOUNTER — Other Ambulatory Visit: Payer: Self-pay

## 2020-01-09 ENCOUNTER — Ambulatory Visit (INDEPENDENT_AMBULATORY_CARE_PROVIDER_SITE_OTHER): Payer: Medicare Other

## 2020-01-09 ENCOUNTER — Ambulatory Visit (INDEPENDENT_AMBULATORY_CARE_PROVIDER_SITE_OTHER): Payer: Medicare Other | Admitting: Nurse Practitioner

## 2020-01-09 VITALS — BP 120/71 | HR 81 | Resp 16 | Wt 235.0 lb

## 2020-01-09 DIAGNOSIS — M7989 Other specified soft tissue disorders: Secondary | ICD-10-CM

## 2020-01-09 DIAGNOSIS — I890 Lymphedema, not elsewhere classified: Secondary | ICD-10-CM

## 2020-01-09 NOTE — Progress Notes (Signed)
History of Present Illness  There is no documented history at this time  Assessments & Plan   There are no diagnoses linked to this encounter.    Additional instructions  Subjective:  Patient presents with venous ulcer of the Right lower extremity.    Procedure:  3 layer unna wrap was placed Right lower extremity.   Plan:   Follow up in one week.   

## 2020-01-10 ENCOUNTER — Ambulatory Visit: Payer: Medicare Other | Admitting: Family Medicine

## 2020-01-10 ENCOUNTER — Ambulatory Visit: Payer: Medicare Other

## 2020-01-13 ENCOUNTER — Encounter (INDEPENDENT_AMBULATORY_CARE_PROVIDER_SITE_OTHER): Payer: Self-pay | Admitting: Nurse Practitioner

## 2020-01-15 DIAGNOSIS — M542 Cervicalgia: Secondary | ICD-10-CM | POA: Diagnosis not present

## 2020-01-15 DIAGNOSIS — M50221 Other cervical disc displacement at C4-C5 level: Secondary | ICD-10-CM | POA: Diagnosis not present

## 2020-01-15 DIAGNOSIS — M4802 Spinal stenosis, cervical region: Secondary | ICD-10-CM | POA: Diagnosis not present

## 2020-01-15 DIAGNOSIS — M47812 Spondylosis without myelopathy or radiculopathy, cervical region: Secondary | ICD-10-CM | POA: Diagnosis not present

## 2020-01-16 ENCOUNTER — Other Ambulatory Visit: Payer: Self-pay

## 2020-01-16 ENCOUNTER — Ambulatory Visit (INDEPENDENT_AMBULATORY_CARE_PROVIDER_SITE_OTHER): Payer: Medicare Other | Admitting: Nurse Practitioner

## 2020-01-16 VITALS — BP 136/79 | HR 82 | Resp 16 | Ht 63.0 in | Wt 233.0 lb

## 2020-01-16 DIAGNOSIS — I890 Lymphedema, not elsewhere classified: Secondary | ICD-10-CM

## 2020-01-16 NOTE — Progress Notes (Signed)
History of Present Illness  There is no documented history at this time  Assessments & Plan   There are no diagnoses linked to this encounter.    Additional instructions  Subjective:  Patient presents with venous ulcer of the Right lower extremity.    Procedure:  3 layer unna wrap was placed Right lower extremity.   Plan:   Follow up in one week.   

## 2020-01-17 ENCOUNTER — Ambulatory Visit (INDEPENDENT_AMBULATORY_CARE_PROVIDER_SITE_OTHER): Payer: Medicare Other

## 2020-01-17 VITALS — BP 123/69 | HR 69 | Ht 63.0 in | Wt 233.0 lb

## 2020-01-17 DIAGNOSIS — Z0000 Encounter for general adult medical examination without abnormal findings: Secondary | ICD-10-CM | POA: Diagnosis not present

## 2020-01-17 NOTE — Progress Notes (Signed)
Subjective:   Ann Spencer is a 79 y.o. female who presents for Medicare Annual (Subsequent) preventive examination.  Review of Systems:  No ROS.  Medicare Wellness Virtual Visit.  Visual/audio telehealth visit, UTA vital signs.   Ht/Wt provided.  See social history for additional risk factors.   Cardiac Risk Factors include: advanced age (>19men, >36 women);hypertension     Objective:     Vitals: BP 123/69 (BP Location: Right Arm, Patient Position: Sitting, Cuff Size: Normal)   Pulse 69   Ht 5\' 3"  (1.6 m)   Wt 233 lb (105.7 kg)   BMI 41.27 kg/m   Body mass index is 41.27 kg/m.  Advanced Directives 01/17/2020 11/23/2019 11/22/2019 11/06/2019 11/05/2019 11/05/2019 04/23/2019  Does Patient Have a Medical Advance Directive? No No No - No No No  Would patient like information on creating a medical advance directive? No - Patient declined No - Patient declined No - Patient declined (No Data) Yes (Inpatient - patient requests chaplain consult to create a medical advance directive) Yes (Inpatient - patient requests chaplain consult to create a medical advance directive) No - Patient declined    Tobacco Social History   Tobacco Use  Smoking Status Former Smoker  . Packs/day: 1.00  . Years: 30.00  . Pack years: 30.00  . Types: Cigarettes  . Quit date: 12/12/1993  . Years since quitting: 26.1  Smokeless Tobacco Never Used     Counseling given: Not Answered   Clinical Intake:  Pre-visit preparation completed: Yes        Diabetes: No  How often do you need to have someone help you when you read instructions, pamphlets, or other written materials from your doctor or pharmacy?: 1 - Never  Interpreter Needed?: No     Past Medical History:  Diagnosis Date  . Allergy   . Anemia   . Arthritis   . Cellulitis   . GERD (gastroesophageal reflux disease)   . History of head injury 11/17/2015  . Hypertension   . PAD (peripheral artery disease) ([place])   . Phlebitis 2014    . Pneumonia due to infectious organism 02/15/2018  . Rotator cuff injury 12/2013   Past Surgical History:  Procedure Laterality Date  . ABDOMINAL HYSTERECTOMY  1981   Full  . COLONOSCOPY    . SHOULDER SURGERY Left 03/03/15  . TOTAL KNEE ARTHROPLASTY Left 11/05/2019   Procedure: TOTAL KNEE ARTHROPLASTY;  Surgeon: Thornton Park, MD;  Location: [facility] ORS;  Service: Orthopedics;  Laterality: Left;  . vein closure Bilateral Feb 2008   Family History  Problem Relation Age of Onset  . Varicose Veins Brother    Social History   Socioeconomic History  . Marital status: Widowed    Spouse name: Not on file  . Number of children: Not on file  . Years of education: Not on file  . Highest education level: Not on file  Occupational History  . Not on file  Tobacco Use  . Smoking status: Former Smoker    Packs/day: 1.00    Years: 30.00    Pack years: 30.00    Types: Cigarettes    Quit date: 12/12/1993    Years since quitting: 26.1  . Smokeless tobacco: Never Used  Substance and Sexual Activity  . Alcohol use: Yes    Alcohol/week: 7.0 standard drinks    Types: 7 Standard drinks or equivalent per week    Comment: Wine qhs  . Drug use: No  . Sexual activity: Never  Other Topics Concern  . Not on file  Social History Narrative   [place]   Lives with daughter Beverlee Nims) and grandaughter    2 dogs lives inside   Enjoys reading   Social Determinants of Health   Financial Resource Strain: Low Risk   . Difficulty of Paying Living Expenses: Not hard at all  Food Insecurity: No Food Insecurity  . Worried About Charity fundraiser in the Last Year: Never true  . Ran Out of Food in the Last Year: Never true  Transportation Needs: No Transportation Needs  . Lack of Transportation (Medical): No  . Lack of Transportation (Non-Medical): No  Physical Activity: Insufficiently Active  . Days of Exercise per Week: 7 days  . Minutes of Exercise per Session: 20 min  Stress: No Stress  Concern Present  . Feeling of Stress : Not at all  Social Connections: Unknown  . Frequency of Communication with Friends and Family: More than three times a week  . Frequency of Social Gatherings with Friends and Family: More than three times a week  . Attends Religious Services: Not on file  . Active Member of Clubs or Organizations: Not on file  . Attends Archivist Meetings: Not on file  . Marital Status: Widowed    Outpatient Encounter Medications as of 01/17/2020  Medication Sig  . aspirin 81 MG tablet Take 81 mg by mouth daily.  . bisacodyl (DULCOLAX) 5 MG EC tablet Take 2 tablets (10 mg total) by mouth daily as needed for moderate constipation.  . Cholecalciferol (D3 ADULT) 25 MCG (1000 UT) CHEW Chew 1,000 Units by mouth daily.   Marland Kitchen docusate sodium (COLACE) 100 MG capsule Take 1 capsule (100 mg total) by mouth 2 (two) times daily.  . ferrous sulfate 325 (65 FE) MG EC tablet Take 325 mg by mouth daily with breakfast.  . hydrochlorothiazide (HYDRODIURIL) 25 MG tablet Take 1 tablet by mouth once daily  . ibuprofen (ADVIL) 400 MG tablet Take 1 tablet (400 mg total) by mouth every 8 (eight) hours as needed.  . Misc Natural Products (OSTEO BI-FLEX JOINT SHIELD PO) Take 1 tablet by mouth daily.   . Multiple Vitamin (MULTIVITAMIN) capsule Take 1 capsule by mouth daily.  Marland Kitchen omeprazole (PRILOSEC) 20 MG capsule Take 20 mg by mouth as needed.  . sulfamethoxazole-trimethoprim (BACTRIM DS) 800-160 MG tablet sulfamethoxazole 800 mg-trimethoprim 160 mg tablet  Take 1 tablet every 12 hours by oral route.  . [DISCONTINUED] doxycycline (VIBRA-TABS) 100 MG tablet Take 1 tablet (100 mg total) by mouth 2 (two) times daily.  . [DISCONTINUED] enoxaparin (LOVENOX) 40 MG/0.4ML injection Inject 0.4 mLs (40 mg total) into the skin daily for 14 days.  . [DISCONTINUED] gabapentin (NEURONTIN) 300 MG capsule Take 1 capsule (300 mg total) by mouth 3 (three) times daily.   Facility-Administered Encounter  Medications as of 01/17/2020  Medication  . lidocaine (PF) (XYLOCAINE) 1 % injection 10 mL    Activities of Daily Living In your present state of health, do you have any difficulty performing the following activities: 01/17/2020 11/23/2019  Hearing? N N  Vision? N N  Difficulty concentrating or making decisions? N N  Walking or climbing stairs? Y Y  Comment Unsteady gait, cane in use -  Dressing or bathing? N N  Doing errands, shopping? N N  Preparing Food and eating ? N -  Using the Toilet? N -  In the past six months, have you accidently leaked urine? N -  Do you have problems with loss of bowel control? N -  Managing your Medications? N -  Managing your Finances? N -  Housekeeping or managing your Housekeeping? N -  Some recent data might be hidden    Patient Care Team: Leone Haven, MD as PCP - General (Family Medicine) Christene Lye, MD (General Surgery) Leward Quan, MD (Family Medicine) Doss, Velora Heckler, RN (Inactive) as Nurse Practitioner (Gerontology)    Assessment:   This is a routine wellness examination for Bray.  Nurse connected with patient 01/17/20 at 11:00 AM EST by a telephone enabled telemedicine application and verified that I am speaking with the correct person using two identifiers. Patient stated full name and DOB. Patient gave permission to continue with virtual visit. Patient's location was at home and Nurse's location was at [place] office.   Patient is alert and oriented x3. Patient denies difficulty focusing or concentrating. Patient assists grand daughter with school work which assists with brain stimulation.   Health Maintenance Due: -PNA vaccine- discussed; plans to wait later in the season.   See completed HM at the end of note.   Eye: Visual acuity not assessed. Virtual visit. Followed by their ophthalmologist.  Dental: UTD  Hearing: Demonstrates normal hearing during visit.  Safety:  Patient feels safe at home-  yes Patient does have smoke detectors at home- yes Patient does wear sunscreen or protective clothing when in direct sunlight - yes Patient does wear seat belt when in a moving vehicle - yes Patient drives- yes Adequate lighting in walkways free from debris- yes Grab bars and handrails used as appropriate- yes Ambulates with an assistive device- no Cell phone on person when ambulating outside of the home- yes  Social: Alcohol intake - yes      Smoking history- former   Smokers in home? none Illicit drug use? none  Medication: Taking as directed and without issues.  Self managed - yes   Covid-19: Precautions and sickness symptoms discussed. Wears mask, social distancing, hand hygiene as appropriate.   Activities of Daily Living Patient denies needing assistance with: household chores, feeding themselves, getting from bed to chair, getting to the toilet, bathing/showering, dressing, managing money, or preparing meals.   Discussed the importance of a healthy diet, water intake and the benefits of aerobic exercise.    Physical activity- post physical therapy exercises, overall strengthening, daily.  Diet:  Regular Water: good intake Caffeine: 3 cups of coffee  Other Providers Patient Care Team: Leone Haven, MD as PCP - General (Family Medicine) Christene Lye, MD (General Surgery) Leward Quan, MD (Family Medicine) Doss, Velora Heckler, RN (Inactive) as Nurse Practitioner (Gerontology)  Exercise Activities and Dietary recommendations Current Exercise Habits: Home exercise routine, Type of exercise: stretching;strength training/weights, Frequency (Times/Week): 7, Intensity: Mild  Goals      Patient Stated   . Weight (lb) < 233 lb 12.8 oz (106.1 kg) (pt-stated)     Drink less wine Low cholesterol/low carb diet       Fall Risk Fall Risk  11/27/2019 10/16/2019 01/08/2019 02/13/2018 12/14/2016  Falls in the past year? 0 1 1 No No  Comment - - No falls since the last  in Sept 2019 which was followed by pcp and ortho.  - -  Number falls in past yr: - 0 - - -  Injury with Fall? - 1 - - -  Risk Factor Category  - - - - -  Risk for fall due to : - - - - -  Risk for fall due to: Comment - - - - -  Follow up - Falls evaluation completed - - -   Timed Get Up and Go performed: no, virtual visit  Depression Screen PHQ 2/9 Scores 01/17/2020 11/27/2019 01/08/2019 02/13/2018  PHQ - 2 Score 0 0 0 0  PHQ- 9 Score - - - -     Cognitive Function     6CIT Screen 01/17/2020 01/08/2019  What Year? 0 points 0 points  What month? 0 points 0 points  What time? 0 points 0 points  Count back from 20 0 points 0 points  Months in reverse 0 points 0 points  Repeat phrase 0 points 0 points  Total Score 0 0    Immunization History  Administered Date(s) Administered  . Fluad Quad(high Dose 65+) 10/03/2019  . Influenza, High Dose Seasonal PF 01/10/2017, 09/04/2017, 09/07/2018  . Pneumococcal Conjugate-13 02/20/2015  . Tdap 12/21/2013   Screening Tests Health Maintenance  Topic Date Due  . PNA vac Low Risk Adult (2 of 2 - PPSV23) 01/16/2021 (Originally 02/20/2016)  . TETANUS/TDAP  12/22/2023  . INFLUENZA VACCINE  Completed  . DEXA SCAN  Completed      Plan:   Keep all routine maintenance appointments.   Follow up 02/17/20 @ 4:00  Medicare Attestation I have personally reviewed: The patient's medical and social history Their use of alcohol, tobacco or illicit drugs Their current medications and supplements The patient's functional ability including ADLs,fall risks, home safety risks, cognitive, and hearing and visual impairment Diet and physical activities Evidence for depression   I have reviewed and discussed with patient certain preventive protocols, quality metrics, and best practice recommendations.   Varney Biles, LPN  [phone]

## 2020-01-17 NOTE — Patient Instructions (Addendum)
  Ms. [place] , Thank you for taking time to come for your Medicare Wellness Visit. I appreciate your ongoing commitment to your health goals. Please review the following plan we discussed and let me know if I can assist you in the future.   These are the goals we discussed: Goals      Patient Stated   . Weight (lb) < 233 lb 12.8 oz (106.1 kg) (pt-stated)     Drink less wine Low cholesterol/low carb diet       This is a list of the screening recommended for you and due dates:  Health Maintenance  Topic Date Due  . Pneumonia vaccines (2 of 2 - PPSV23) 01/16/2021*  . Tetanus Vaccine  12/22/2023  . Flu Shot  Completed  . DEXA scan (bone density measurement)  Completed  *Topic was postponed. The date shown is not the original due date.

## 2020-01-20 ENCOUNTER — Encounter (INDEPENDENT_AMBULATORY_CARE_PROVIDER_SITE_OTHER): Payer: Self-pay | Admitting: Nurse Practitioner

## 2020-01-23 ENCOUNTER — Encounter (INDEPENDENT_AMBULATORY_CARE_PROVIDER_SITE_OTHER): Payer: Self-pay

## 2020-01-23 ENCOUNTER — Other Ambulatory Visit: Payer: Self-pay

## 2020-01-23 ENCOUNTER — Ambulatory Visit (INDEPENDENT_AMBULATORY_CARE_PROVIDER_SITE_OTHER): Payer: Medicare Other | Admitting: Nurse Practitioner

## 2020-01-23 VITALS — BP 121/79 | HR 80 | Resp 16 | Wt 233.0 lb

## 2020-01-23 DIAGNOSIS — I890 Lymphedema, not elsewhere classified: Secondary | ICD-10-CM

## 2020-01-23 NOTE — Progress Notes (Signed)
History of Present Illness  There is no documented history at this time  Assessments & Plan   There are no diagnoses linked to this encounter.    Additional instructions  Subjective:  Patient presents with venous ulcer of the Right lower extremity.    Procedure:  3 layer unna wrap was placed Right lower extremity.   Plan:   Follow up in one week.   

## 2020-01-26 DIAGNOSIS — Z23 Encounter for immunization: Secondary | ICD-10-CM | POA: Diagnosis not present

## 2020-01-28 ENCOUNTER — Encounter (INDEPENDENT_AMBULATORY_CARE_PROVIDER_SITE_OTHER): Payer: Medicare Other

## 2020-01-29 ENCOUNTER — Other Ambulatory Visit: Payer: Self-pay

## 2020-01-29 ENCOUNTER — Encounter (INDEPENDENT_AMBULATORY_CARE_PROVIDER_SITE_OTHER): Payer: Self-pay | Admitting: Nurse Practitioner

## 2020-01-29 ENCOUNTER — Ambulatory Visit (INDEPENDENT_AMBULATORY_CARE_PROVIDER_SITE_OTHER): Payer: Medicare Other | Admitting: Nurse Practitioner

## 2020-01-29 VITALS — BP 148/87 | HR 74 | Resp 16 | Ht 63.0 in | Wt 233.0 lb

## 2020-01-29 DIAGNOSIS — I890 Lymphedema, not elsewhere classified: Secondary | ICD-10-CM | POA: Diagnosis not present

## 2020-01-29 NOTE — Progress Notes (Signed)
History of Present Illness  There is no documented history at this time  Assessments & Plan   There are no diagnoses linked to this encounter.    Additional instructions  Subjective:  Patient presents with venous ulcer of the Right lower extremity.    Procedure:  3 layer unna wrap was placed Right lower extremity.   Plan:   Follow up in one week.   

## 2020-01-30 ENCOUNTER — Encounter (INDEPENDENT_AMBULATORY_CARE_PROVIDER_SITE_OTHER): Payer: Medicare Other

## 2020-02-06 ENCOUNTER — Other Ambulatory Visit: Payer: Self-pay

## 2020-02-06 ENCOUNTER — Ambulatory Visit (INDEPENDENT_AMBULATORY_CARE_PROVIDER_SITE_OTHER): Payer: Medicare Other | Admitting: Nurse Practitioner

## 2020-02-06 ENCOUNTER — Encounter (INDEPENDENT_AMBULATORY_CARE_PROVIDER_SITE_OTHER): Payer: Self-pay | Admitting: Nurse Practitioner

## 2020-02-06 VITALS — BP 143/77 | HR 92 | Resp 16 | Ht 63.0 in | Wt 234.0 lb

## 2020-02-06 DIAGNOSIS — L03119 Cellulitis of unspecified part of limb: Secondary | ICD-10-CM | POA: Diagnosis not present

## 2020-02-06 DIAGNOSIS — I890 Lymphedema, not elsewhere classified: Secondary | ICD-10-CM | POA: Diagnosis not present

## 2020-02-10 ENCOUNTER — Encounter (INDEPENDENT_AMBULATORY_CARE_PROVIDER_SITE_OTHER): Payer: Self-pay | Admitting: Nurse Practitioner

## 2020-02-10 NOTE — Progress Notes (Signed)
SUBJECTIVE:  Patient ID: Ann Spencer, female    DOB: 05/05/1941, 79 y.o.   MRN: [id number] Chief Complaint  Patient presents with  . Follow-up    RLE unna check     HPI  Ann Spencer is a 79 y.o. female that presents today for evaluation of lower extremity edema.  The patient has had persistent edema following a recent knee replacement and several episodes of cellulitis.  Several weeks ago we placed the patient in [place] wraps on her right lower extremity.  Today, the patient states that the swelling is much better and her leg feels much better at this time.  She denies any fever, chills, nausea, vomiting or diarrhea.  The patient also regularly wears medical grade 1 compression stockings and she does have an additional pair today.  Past Medical History:  Diagnosis Date  . Allergy   . Anemia   . Arthritis   . Cellulitis   . GERD (gastroesophageal reflux disease)   . History of head injury 11/17/2015  . Hypertension   . PAD (peripheral artery disease) ([place])   . Phlebitis 2014  . Pneumonia due to infectious organism 02/15/2018  . Rotator cuff injury 12/2013    Past Surgical History:  Procedure Laterality Date  . ABDOMINAL HYSTERECTOMY  1981   Full  . COLONOSCOPY    . SHOULDER SURGERY Left 03/03/15  . TOTAL KNEE ARTHROPLASTY Left 11/05/2019   Procedure: TOTAL KNEE ARTHROPLASTY;  Surgeon: Thornton Park, MD;  Location: [facility] ORS;  Service: Orthopedics;  Laterality: Left;  . vein closure Bilateral Feb 2008    Social History   Socioeconomic History  . Marital status: Widowed    Spouse name: Not on file  . Number of children: Not on file  . Years of education: Not on file  . Highest education level: Not on file  Occupational History  . Not on file  Tobacco Use  . Smoking status: Former Smoker    Packs/day: 1.00    Years: 30.00    Pack years: 30.00    Types: Cigarettes    Quit date: 12/12/1993    Years since quitting: 26.1  . Smokeless tobacco: Never Used  Substance  and Sexual Activity  . Alcohol use: Yes    Alcohol/week: 7.0 standard drinks    Types: 7 Standard drinks or equivalent per week    Comment: Wine qhs  . Drug use: No  . Sexual activity: Never  Other Topics Concern  . Not on file  Social History Narrative   [place]   Lives with daughter Beverlee Nims) and grandaughter    2 dogs lives inside   Enjoys reading   Social Determinants of Health   Financial Resource Strain: Low Risk   . Difficulty of Paying Living Expenses: Not hard at all  Food Insecurity: No Food Insecurity  . Worried About Charity fundraiser in the Last Year: Never true  . Ran Out of Food in the Last Year: Never true  Transportation Needs: No Transportation Needs  . Lack of Transportation (Medical): No  . Lack of Transportation (Non-Medical): No  Physical Activity: Insufficiently Active  . Days of Exercise per Week: 7 days  . Minutes of Exercise per Session: 20 min  Stress: No Stress Concern Present  . Feeling of Stress : Not at all  Social Connections: Unknown  . Frequency of Communication with Friends and Family: More than three times a week  . Frequency of Social Gatherings with Friends and  Family: More than three times a week  . Attends Religious Services: Not on file  . Active Member of Clubs or Organizations: Not on file  . Attends Archivist Meetings: Not on file  . Marital Status: Widowed  Intimate Partner Violence: Not At Risk  . Fear of Current or Ex-Partner: No  . Emotionally Abused: No  . Physically Abused: No  . Sexually Abused: No    Family History  Problem Relation Age of Onset  . Varicose Veins Brother     Allergies  Allergen Reactions  . Penicillins     Did it involve swelling of the face/tongue/throat, SOB, or low BP? Unknown Did it involve sudden or severe rash/hives, skin peeling, or any reaction on the inside of your mouth or nose? Unknown Did you need to seek medical attention at a hospital or doctor's office?  Unknown When did it last happen? Childhood allergy If all above answers are "NO", may proceed with cephalosporin use.      Review of Systems   Review of Systems: Negative Unless Checked Constitutional: [] Weight loss  [] Fever  [] Chills Cardiac: [] Chest pain   []  Atrial Fibrillation  [] Palpitations   [] Shortness of breath when laying flat   [] Shortness of breath with exertion. [] Shortness of breath at rest Vascular:  [] Pain in legs with walking   [] Pain in legs with standing [] Pain in legs when laying flat   [] Claudication    [] Pain in feet when laying flat    [] History of DVT   [] Phlebitis   [x] Swelling in legs   [x] Varicose veins   [] Non-healing ulcers Pulmonary:   [] Uses home oxygen   [] Productive cough   [] Hemoptysis   [] Wheeze  [] COPD   [] Asthma Neurologic:  [] Dizziness   [] Seizures  [] Blackouts [] History of stroke   [] History of TIA  [] Aphasia   [] Temporary Blindness   [] Weakness or numbness in arm   [] Weakness or numbness in leg Musculoskeletal:   [] Joint swelling   [] Joint pain   [] Low back pain  []  History of Knee Replacement [x] Arthritis [] back Surgeries  []  Spinal Stenosis    Hematologic:  [] Easy bruising  [] Easy bleeding   [] Hypercoagulable state   [] Anemic Gastrointestinal:  [] Diarrhea   [] Vomiting  [x] Gastroesophageal reflux/heartburn   [] Difficulty swallowing. [] Abdominal pain Genitourinary:  [] Chronic kidney disease   [] Difficult urination  [] Anuric   [] Blood in urine [] Frequent urination  [] Burning with urination   [] Hematuria Skin:  [] Rashes   [] Ulcers [] Wounds Psychological:  [] History of anxiety   []  History of major depression  []  Memory Difficulties      OBJECTIVE:   Physical Exam  BP (!) 143/77 (BP Location: Right Arm)   Pulse 92   Resp 16   Ht 5\' 3"  (1.6 m)   Wt 234 lb (106.1 kg)   BMI 41.45 kg/m   Gen: WD/WN, NAD Head: [place]/AT, No temporalis wasting.  Ear/Nose/Throat: Hearing grossly intact, nares w/o erythema or drainage Eyes: PER, EOMI, sclera  nonicteric.  Neck: Supple, no masses.  No JVD.  Pulmonary:  Good air movement, no use of accessory muscles.  Cardiac: RRR Vascular:  2+ edema bilaterally  Gastrointestinal: soft, non-distended. No guarding/no peritoneal signs.  Musculoskeletal: M/S 5/5 throughout.  No deformity or atrophy.  Neurologic: Pain and light touch intact in extremities.  Symmetrical.  Speech is fluent. Motor exam as listed above. Psychiatric: Judgment intact, Mood & affect appropriate for pt's clinical situation. Dermatologic:  Mild stasis dermatitis. No Ulcers Noted.  No changes consistent with cellulitis.  Lymph : No Cervical lymphadenopathy, no lichenification or skin changes of chronic lymphedema.       ASSESSMENT AND PLAN:  1. Lymphedema Patient is currently doing well utilizing conservative therapy measures.  The patient will continue to utilize conservative therapy measures including wearing her medical grade 1 compression stockings, elevating and exercising when possible.  The patient will return to the office in 6 months with noninvasive studies.  Patient is advised that if swelling, cellulitis or ulceration occur before then she is welcome to contact the office and we will see her sooner.  2. Cellulitis of lower extremity, unspecified laterality Currently resolved   Current Outpatient Medications on File Prior to Visit  Medication Sig Dispense Refill  . aspirin 81 MG tablet Take 81 mg by mouth daily.    . bisacodyl (DULCOLAX) 5 MG EC tablet Take 2 tablets (10 mg total) by mouth daily as needed for moderate constipation. 30 tablet 0  . Cholecalciferol (D3 ADULT) 25 MCG (1000 UT) CHEW Chew 1,000 Units by mouth daily.     Marland Kitchen docusate sodium (COLACE) 100 MG capsule Take 1 capsule (100 mg total) by mouth 2 (two) times daily. 10 capsule 0  . ferrous sulfate 325 (65 FE) MG EC tablet Take 325 mg by mouth daily with breakfast.    . hydrochlorothiazide (HYDRODIURIL) 25 MG tablet Take 1 tablet by mouth once daily  90 tablet 1  . ibuprofen (ADVIL) 400 MG tablet Take 1 tablet (400 mg total) by mouth every 8 (eight) hours as needed. 15 tablet 0  . Misc Natural Products (OSTEO BI-FLEX JOINT SHIELD PO) Take 1 tablet by mouth daily.     . Multiple Vitamin (MULTIVITAMIN) capsule Take 1 capsule by mouth daily.    Marland Kitchen omeprazole (PRILOSEC) 20 MG capsule Take 20 mg by mouth as needed.    . sulfamethoxazole-trimethoprim (BACTRIM DS) 800-160 MG tablet sulfamethoxazole 800 mg-trimethoprim 160 mg tablet  Take 1 tablet every 12 hours by oral route.     Current Facility-Administered Medications on File Prior to Visit  Medication Dose Route Frequency Provider Last Rate Last Admin  . lidocaine (PF) (XYLOCAINE) 1 % injection 10 mL  10 mL Other Once Milinda Pointer, MD        There are no Patient Instructions on file for this visit. No follow-ups on file.   Kris Hartmann, NP  This note was completed with Sales executive.  Any errors are purely unintentional.

## 2020-02-17 ENCOUNTER — Other Ambulatory Visit: Payer: Self-pay

## 2020-02-17 ENCOUNTER — Encounter: Payer: Self-pay | Admitting: Family Medicine

## 2020-02-17 ENCOUNTER — Ambulatory Visit (INDEPENDENT_AMBULATORY_CARE_PROVIDER_SITE_OTHER): Payer: Medicare Other | Admitting: Family Medicine

## 2020-02-17 VITALS — Ht 63.0 in | Wt 229.0 lb

## 2020-02-17 DIAGNOSIS — I10 Essential (primary) hypertension: Secondary | ICD-10-CM | POA: Diagnosis not present

## 2020-02-17 DIAGNOSIS — E785 Hyperlipidemia, unspecified: Secondary | ICD-10-CM | POA: Diagnosis not present

## 2020-02-17 DIAGNOSIS — I890 Lymphedema, not elsewhere classified: Secondary | ICD-10-CM | POA: Diagnosis not present

## 2020-02-17 DIAGNOSIS — L03119 Cellulitis of unspecified part of limb: Secondary | ICD-10-CM | POA: Diagnosis not present

## 2020-02-17 DIAGNOSIS — K219 Gastro-esophageal reflux disease without esophagitis: Secondary | ICD-10-CM | POA: Diagnosis not present

## 2020-02-17 DIAGNOSIS — D649 Anemia, unspecified: Secondary | ICD-10-CM | POA: Insufficient documentation

## 2020-02-17 NOTE — Progress Notes (Signed)
Virtual Visit via telephone Note  This visit type was conducted due to national recommendations for restrictions regarding the COVID-19 pandemic (e.g. social distancing).  This format is felt to be most appropriate for this patient at this time.  All issues noted in this document were discussed and addressed.  No physical exam was performed (except for noted visual exam findings with Video Visits).   I connected with Ann Spencer today at  4:00 PM EST by telephone and verified that I am speaking with the correct person using two identifiers. Location patient: car Location provider: home office Persons participating in the virtual visit: patient, provider  I discussed the limitations, risks, security and privacy concerns of performing an evaluation and management service by telephone and the availability of in person appointments. I also discussed with the patient that there may be a patient responsible charge related to this service. The patient expressed understanding and agreed to proceed.  Interactive audio and video telecommunications were attempted between this provider and patient, however failed, due to patient having technical difficulties OR patient did not have access to video capability.  We continued and completed visit with audio only.   Reason for visit: follow-up  HPI: Hypertension: Has been running in the 120s over 60s-70s.  Taking HCTZ.  No chest pain, shortness of breath, or edema.  GERD: She notes no recent symptoms.  She has not had to take omeprazole.  No blood in her stool.  No dysphagia.  Hyperlipidemia: Notes her weight has been stable.  She has been eating relatively healthily with lots of salads and healthy vegetables.  Some chocolate.  She is trying to walk 3 to 5 days a week.  Lymphedema/venous insufficiency: Patient did have some issues with cellulitis back in December.  She subsequently followed up with vascular surgery after her hospitalization and her legs  were wrapped the entirety of January.  She notes they look good now.  No signs of cellulitis.  She is to follow-up with vascular surgery in 6 months or sooner if she develops issues.   ROS: See pertinent positives and negatives per HPI.  Past Medical History:  Diagnosis Date  . Allergy   . Anemia   . Arthritis   . Cellulitis   . GERD (gastroesophageal reflux disease)   . History of head injury 11/17/2015  . Hypertension   . PAD (peripheral artery disease) ([place])   . Phlebitis 2014  . Pneumonia due to infectious organism 02/15/2018  . Rotator cuff injury 12/2013    Past Surgical History:  Procedure Laterality Date  . ABDOMINAL HYSTERECTOMY  1981   Full  . COLONOSCOPY    . SHOULDER SURGERY Left 03/03/15  . TOTAL KNEE ARTHROPLASTY Left 11/05/2019   Procedure: TOTAL KNEE ARTHROPLASTY;  Surgeon: Thornton Park, MD;  Location: [facility] ORS;  Service: Orthopedics;  Laterality: Left;  . vein closure Bilateral Feb 2008    Family History  Problem Relation Age of Onset  . Varicose Veins Brother     SOCIAL HX: Former smoker   Current Outpatient Medications:  .  aspirin 81 MG tablet, Take 81 mg by mouth daily., Disp: , Rfl:  .  bisacodyl (DULCOLAX) 5 MG EC tablet, Take 2 tablets (10 mg total) by mouth daily as needed for moderate constipation., Disp: 30 tablet, Rfl: 0 .  Cholecalciferol (D3 ADULT) 25 MCG (1000 UT) CHEW, Chew 1,000 Units by mouth daily. , Disp: , Rfl:  .  docusate sodium (COLACE) 100 MG capsule, Take 1 capsule (  100 mg total) by mouth 2 (two) times daily., Disp: 10 capsule, Rfl: 0 .  ferrous sulfate 325 (65 FE) MG EC tablet, Take 325 mg by mouth daily with breakfast., Disp: , Rfl:  .  hydrochlorothiazide (HYDRODIURIL) 25 MG tablet, Take 1 tablet by mouth once daily, Disp: 90 tablet, Rfl: 1 .  ibuprofen (ADVIL) 400 MG tablet, Take 1 tablet (400 mg total) by mouth every 8 (eight) hours as needed., Disp: 15 tablet, Rfl: 0 .  Misc Natural Products (OSTEO BI-FLEX JOINT SHIELD PO),  Take 1 tablet by mouth daily. , Disp: , Rfl:  .  Multiple Vitamin (MULTIVITAMIN) capsule, Take 1 capsule by mouth daily., Disp: , Rfl:  .  omeprazole (PRILOSEC) 20 MG capsule, Take 20 mg by mouth as needed., Disp: , Rfl:  .  sulfamethoxazole-trimethoprim (BACTRIM DS) 800-160 MG tablet, sulfamethoxazole 800 mg-trimethoprim 160 mg tablet  Take 1 tablet every 12 hours by oral route., Disp: , Rfl:   Current Facility-Administered Medications:  .  lidocaine (PF) (XYLOCAINE) 1 % injection 10 mL, 10 mL, Other, Once, Milinda Pointer, MD  EXAM:  VITALS per patient if applicable:  GENERAL: alert, oriented, appears well and in no acute distress  HEENT: atraumatic, conjunttiva clear, no obvious abnormalities on inspection of external nose and ears  NECK: normal movements of the head and neck  LUNGS: on inspection no signs of respiratory distress, breathing rate appears normal, no obvious gross SOB, gasping or wheezing  CV: no obvious cyanosis  MS: moves all visible extremities without noticeable abnormality  PSYCH/NEURO: pleasant and cooperative, no obvious depression or anxiety, speech and thought processing grossly intact  ASSESSMENT AND PLAN:  Discussed the following assessment and plan:  Cellulitis of lower extremity Resolved.  She will monitor her legs closely given her swelling issues and if she develops any signs of cellulitis she will contact us or vascular surgery.  Hyperlipidemia Check lipid panel.  Encouraged healthy diet and exercise.  Lymphedema Improved.  She will continue to see vascular surgery.  Essential hypertension At goal.  Continue current medication.  Check BMP.  GERD (gastroesophageal reflux disease) Currently asymptomatic.  She will monitor for recurrence of symptoms.  Anemia Recheck from when she was in the hospital in December.   Orders Placed This Encounter  Procedures  . Basic Metabolic Panel (BMET)    Standing Status:   Future    Standing  Expiration Date:   02/16/2021  . CBC    Standing Status:   Future    Standing Expiration Date:   02/16/2021    No orders of the defined types were placed in this encounter.    I discussed the assessment and treatment plan with the patient. The patient was provided an opportunity to ask questions and all were answered. The patient agreed with the plan and demonstrated an understanding of the instructions.   The patient was advised to call back or seek an in-person evaluation if the symptoms worsen or if the condition fails to improve as anticipated.  I provided 11 minutes of non-face-to-face time during this encounter.   Tommi Rumps, MD

## 2020-02-17 NOTE — Assessment & Plan Note (Signed)
Resolved.  She will monitor her legs closely given her swelling issues and if she develops any signs of cellulitis she will contact us or vascular surgery.

## 2020-02-17 NOTE — Assessment & Plan Note (Addendum)
Check lipid panel.  Encouraged healthy diet and exercise.

## 2020-02-17 NOTE — Assessment & Plan Note (Signed)
Currently asymptomatic.  She will monitor for recurrence of symptoms.

## 2020-02-17 NOTE — Assessment & Plan Note (Signed)
Recheck from when she was in the hospital in December.

## 2020-02-17 NOTE — Assessment & Plan Note (Signed)
Improved.  She will continue to see vascular surgery.

## 2020-02-17 NOTE — Assessment & Plan Note (Signed)
At goal.  Continue current medication.  Check BMP.

## 2020-02-24 DIAGNOSIS — Z23 Encounter for immunization: Secondary | ICD-10-CM | POA: Diagnosis not present

## 2020-02-27 ENCOUNTER — Ambulatory Visit (INDEPENDENT_AMBULATORY_CARE_PROVIDER_SITE_OTHER): Payer: Medicare Other | Admitting: Vascular Surgery

## 2020-03-05 DIAGNOSIS — M5030 Other cervical disc degeneration, unspecified cervical region: Secondary | ICD-10-CM | POA: Diagnosis not present

## 2020-03-30 DIAGNOSIS — G5603 Carpal tunnel syndrome, bilateral upper limbs: Secondary | ICD-10-CM | POA: Diagnosis not present

## 2020-04-02 DIAGNOSIS — M5030 Other cervical disc degeneration, unspecified cervical region: Secondary | ICD-10-CM | POA: Diagnosis not present

## 2020-04-03 ENCOUNTER — Other Ambulatory Visit: Payer: Self-pay

## 2020-04-03 ENCOUNTER — Other Ambulatory Visit (INDEPENDENT_AMBULATORY_CARE_PROVIDER_SITE_OTHER): Payer: Medicare Other

## 2020-04-03 DIAGNOSIS — D649 Anemia, unspecified: Secondary | ICD-10-CM | POA: Diagnosis not present

## 2020-04-03 DIAGNOSIS — I10 Essential (primary) hypertension: Secondary | ICD-10-CM

## 2020-04-03 LAB — BASIC METABOLIC PANEL
BUN: 16 mg/dL (ref 6–23)
CO2: 34 mEq/L — ABNORMAL HIGH (ref 19–32)
Calcium: 9.4 mg/dL (ref 8.4–10.5)
Chloride: 100 mEq/L (ref 96–112)
Creatinine, Ser: 0.9 mg/dL (ref 0.40–1.20)
GFR: 60.62 mL/min (ref >60.00)
Glucose, Bld: 114 mg/dL — ABNORMAL HIGH (ref 70–99)
Potassium: 5.2 mEq/L — ABNORMAL HIGH (ref 3.5–5.1)
Sodium: 139 mEq/L (ref 135–145)

## 2020-04-03 LAB — CBC
HCT: 40.7 % (ref 36.0–46.0)
Hemoglobin: 13.7 g/dL (ref 12.0–15.0)
MCHC: 33.7 g/dL (ref 30.0–36.0)
MCV: 99.6 fl (ref 78.0–100.0)
Platelets: 181 10*3/uL (ref 150.0–400.0)
RBC: 4.08 Mil/uL (ref 3.87–5.11)
RDW: 14.9 % (ref 11.5–15.5)
WBC: 3.8 10*3/uL — ABNORMAL LOW (ref 4.0–10.5)

## 2020-04-06 IMAGING — CT CT HEAD WITHOUT CONTRAST
3 series · 15 of 47 positions shown, 18 images · non-contrast
Comparison: Head CT scan 01/09/2014.

CLINICAL DATA: Episode of diaphoresis and tingling for 15 minutes
yesterday. The patient is unable to open her left eye today.

EXAM:
CT HEAD WITHOUT CONTRAST
TECHNIQUE: Contiguous axial images were obtained from the base of the skull
through the vertex without intravenous contrast.

[Series 2: head wo · axial · 0.47mm/px · z∈[-125,+0]mm · 9 of 30 slices shown, 12 images]
[im 3/30  brain]
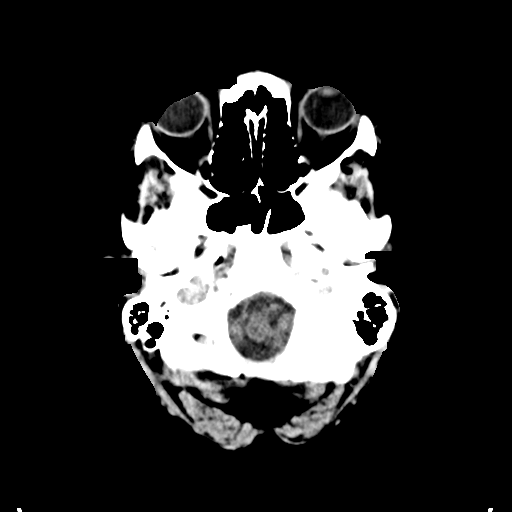
[im 3/30  bone]
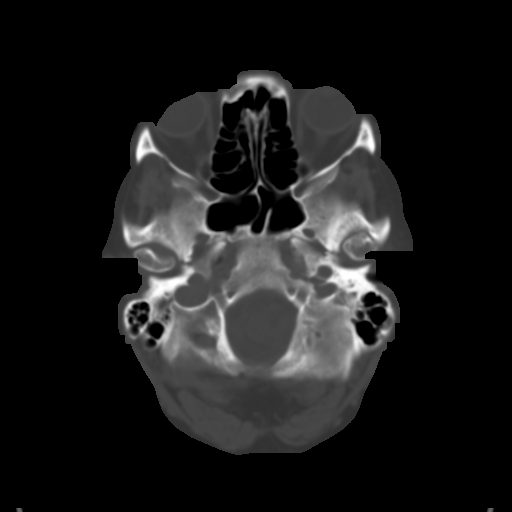
[im 6/30  brain]
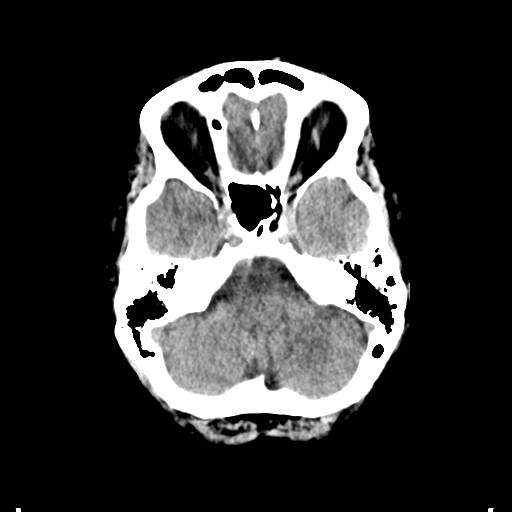
[im 9/30  brain]
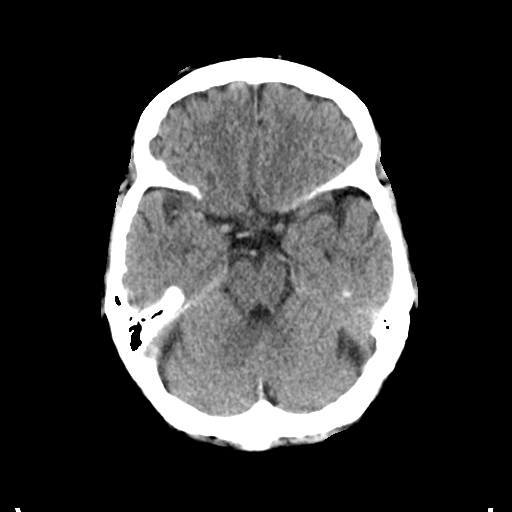
[im 12/30  brain]
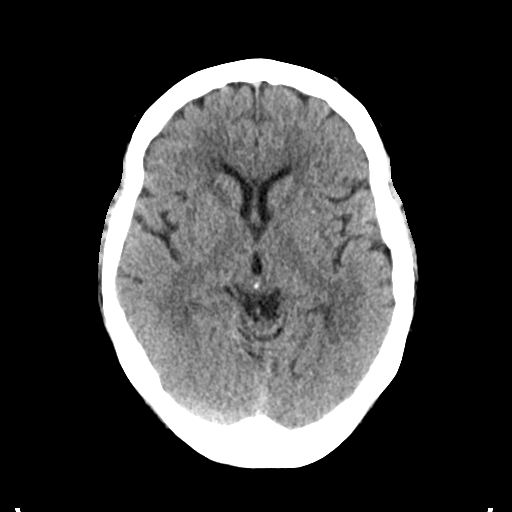
[im 16/30  brain]
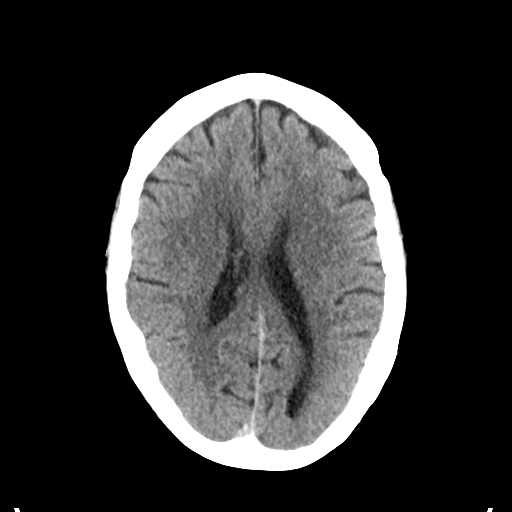
[im 16/30  bone]
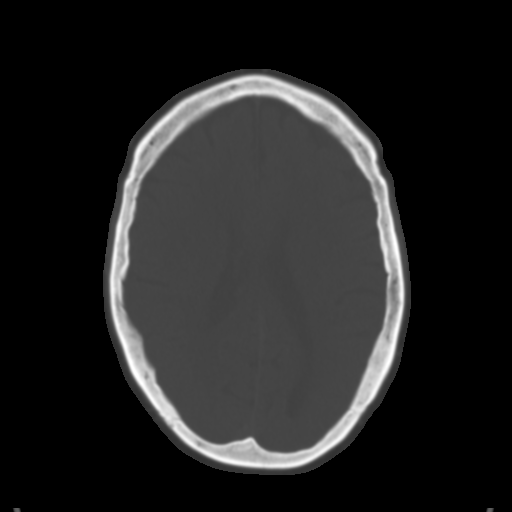
[im 19/30  brain]
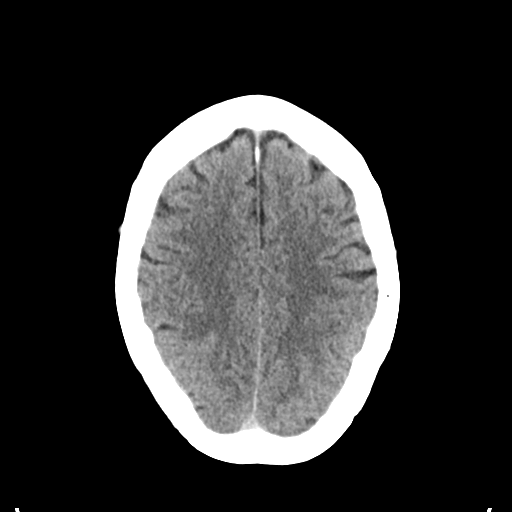
[im 22/30  brain]
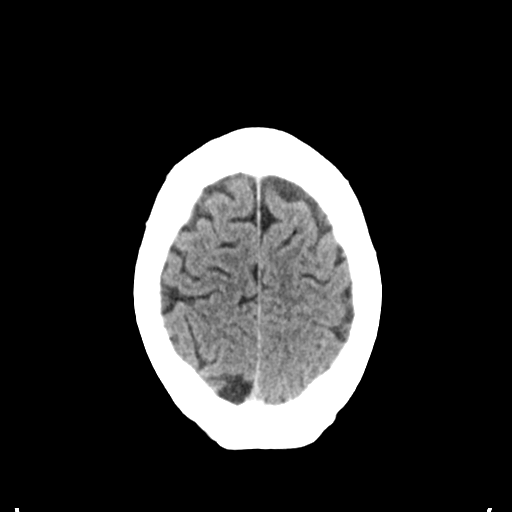
[im 25/30  brain]
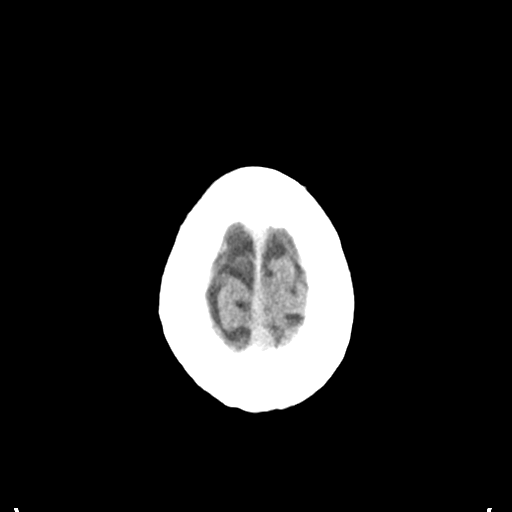
[im 28/30  brain]
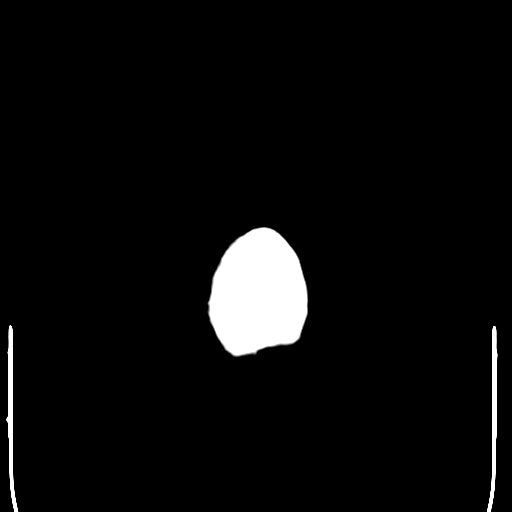
[im 28/30  bone]
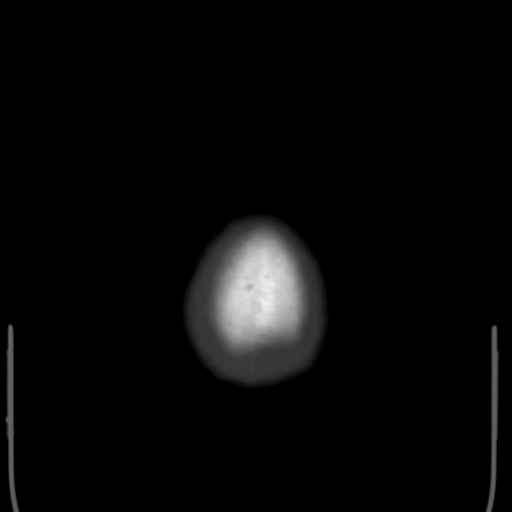

[Series 4: coronal soft tissue · coronal · 0.29mm/px · 3 of 68 slices shown]
[im 23/68  brain]
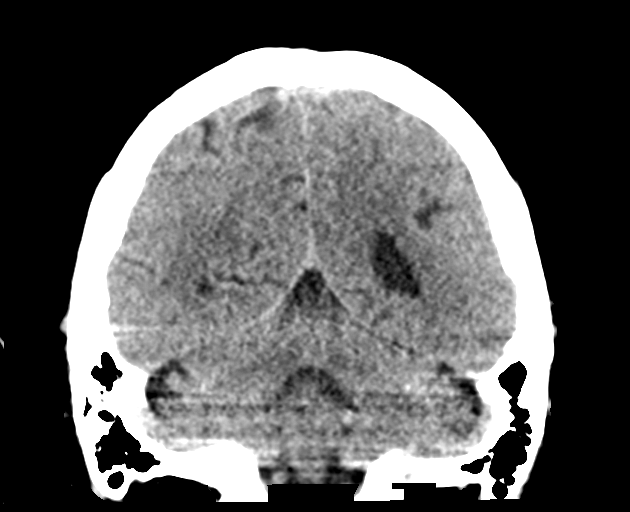
[im 30/68  brain]
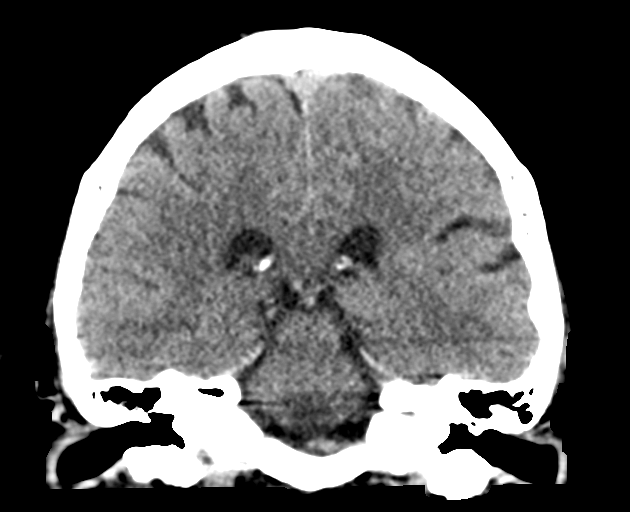
[im 38/68  brain]
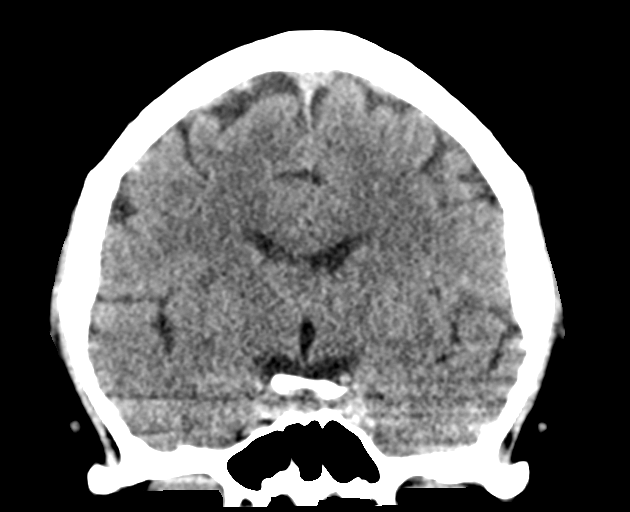

[Series 5: sagittal soft tissue · sagittal · 0.31mm/px · 3 of 61 slices shown]
[im 21/61  brain]
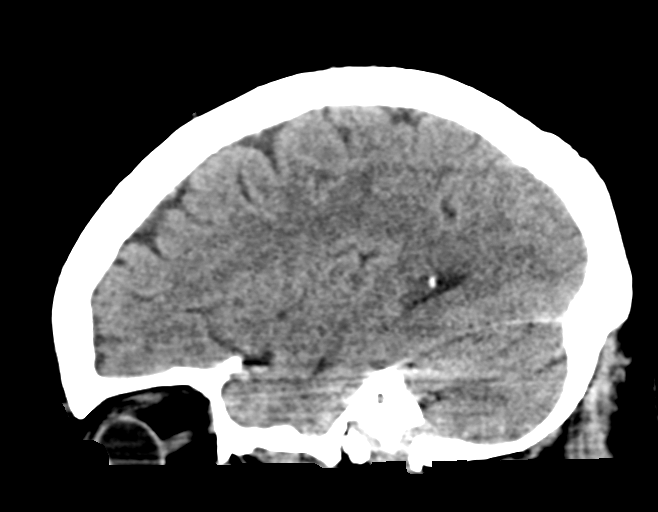
[im 31/61  brain]
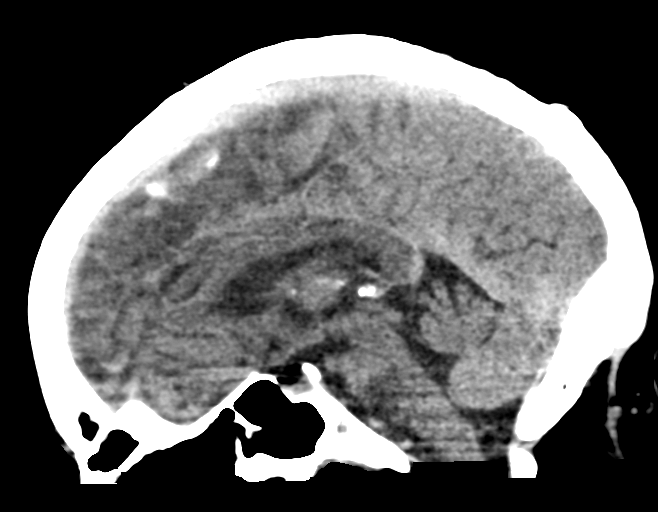
[im 41/61  brain]
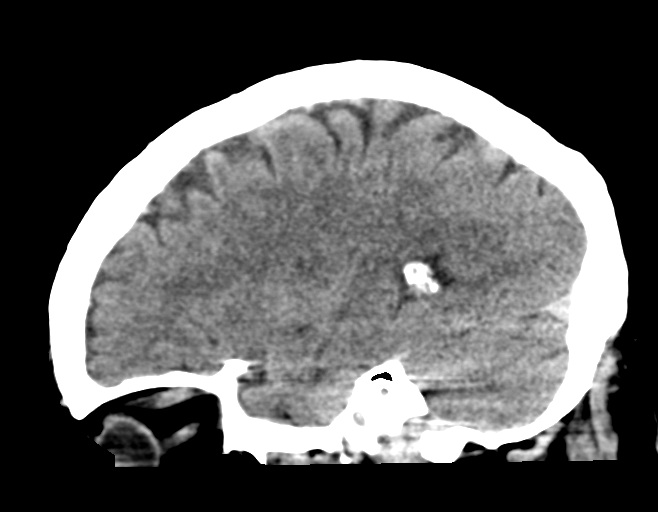

[15 of 47 positions shown; findings below may reference images not displayed]

FINDINGS: Brain: No evidence of acute infarction, hemorrhage, hydrocephalus,
extra-axial collection or mass lesion/mass effect. Mild chronic
microvascular ischemic change noted.

Vascular: No hyperdense vessel or unexpected calcification.

Skull: Intact.  No focal lesion.

Sinuses/Orbits: Negative.

Other: None.
IMPRESSION: No acute abnormality.

Mild chronic microvascular ischemic change.

## 2020-04-06 IMAGING — MR MRI HEAD WITHOUT CONTRAST
9 of 10 series · 41 of 48 positions shown · non-contrast
Comparison: Head CT 04/23/2019

CLINICAL DATA: Limited left eyelid movement. Bilateral hand
numbness and tingling.

EXAM:
MRI HEAD WITHOUT CONTRAST
TECHNIQUE: Multiplanar, multiecho pulse sequences of the brain and surrounding
structures were obtained without intravenous contrast.

[Series 2: ax dwi_tracew · axial · 3.0mm · 0.68mm/px · z∈[-45,+116]mm · 7 of 55 slices shown]
[im 1/55]
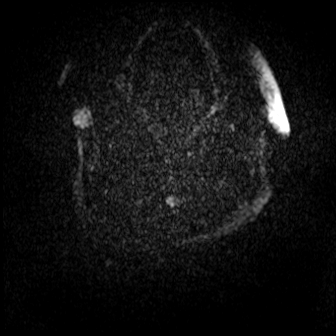
[im 10/55]
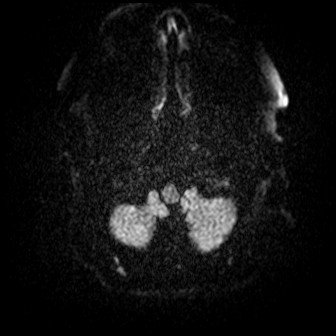
[im 19/55]
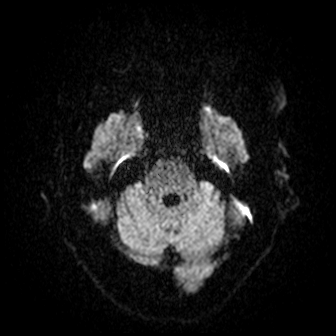
[im 28/55]
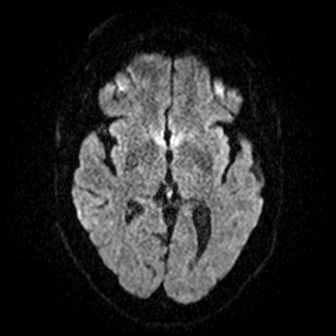
[im 37/55]
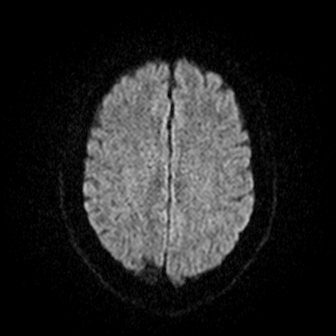
[im 46/55]
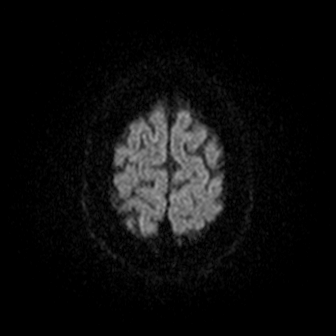
[im 55/55]
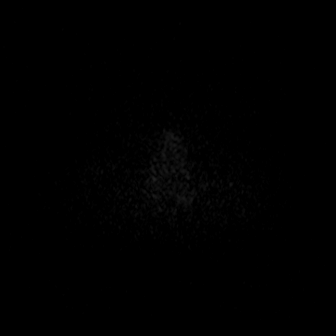

[Series 3: ax dwi_adc · axial · 3.0mm · 0.68mm/px · z∈[-45,+116]mm · 7 of 55 slices shown]
[im 1/55]
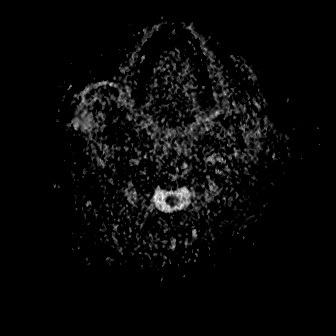
[im 10/55]
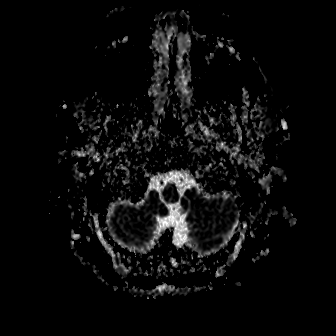
[im 19/55]
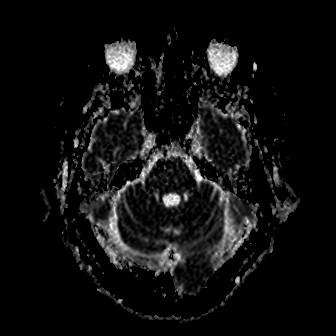
[im 28/55]
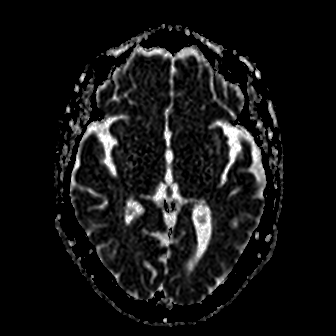
[im 37/55]
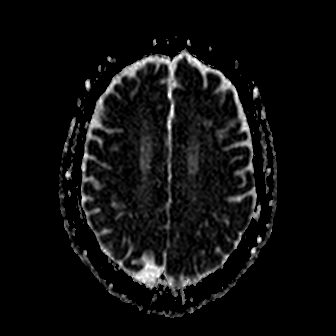
[im 46/55]
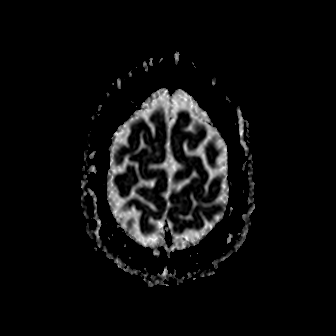
[im 55/55]
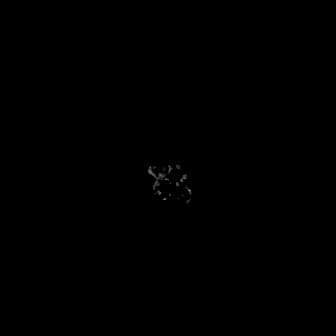

[Series 4: cor dwi_tracew · coronal · 5.0mm · 0.68mm/px · 5 of 38 slices shown]
[im 1/38]
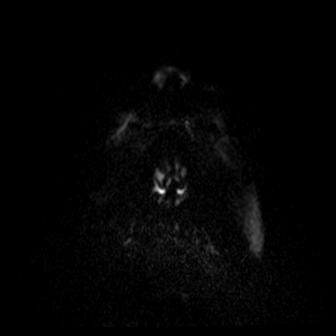
[im 10/38]
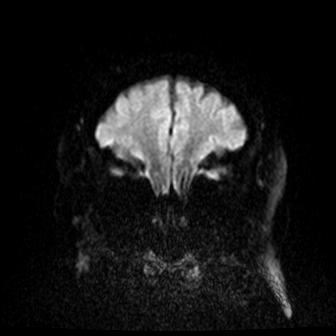
[im 19/38]
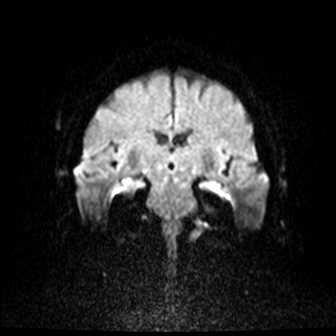
[im 28/38]
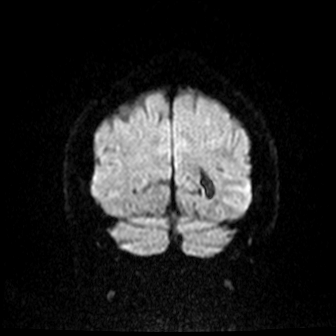
[im 38/38]
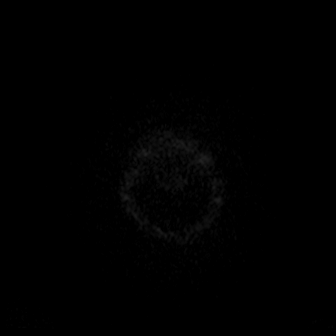

[Series 5: cor dwi_adc · coronal · 5.0mm · 0.68mm/px · 2 of 38 slices shown]
[im 1/38]
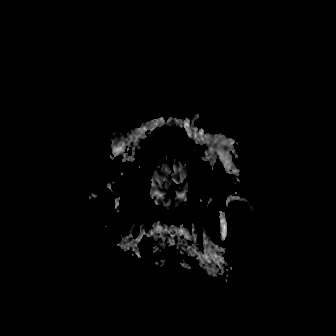
[im 10/38]
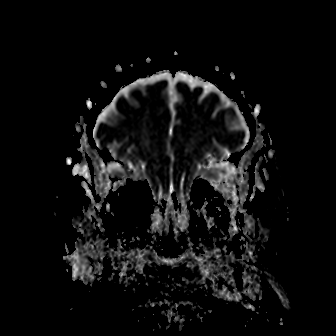

[Series 6: T1 · sagittal · 5.0mm · 0.94mm/px · 3 of 23 slices shown (1 of 2)]
[im 1/23]
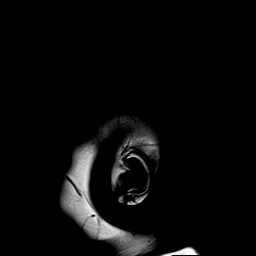
[im 12/23]
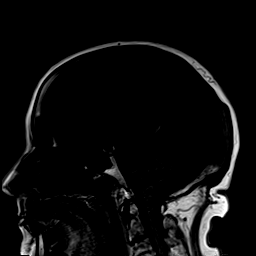
[im 23/23]
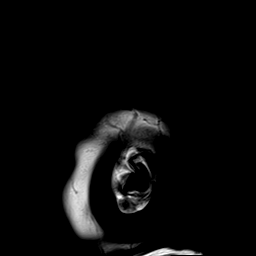

[Series 7: T2 · axial · 5.0mm · 0.45mm/px · z∈[-43,+113]mm · 4 of 27 slices shown (1 of 2)]
[im 1/27]
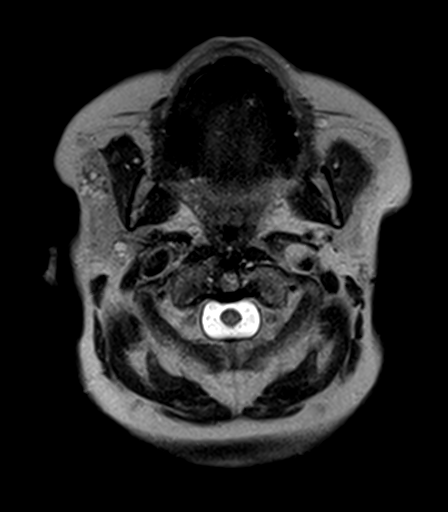
[im 9/27]
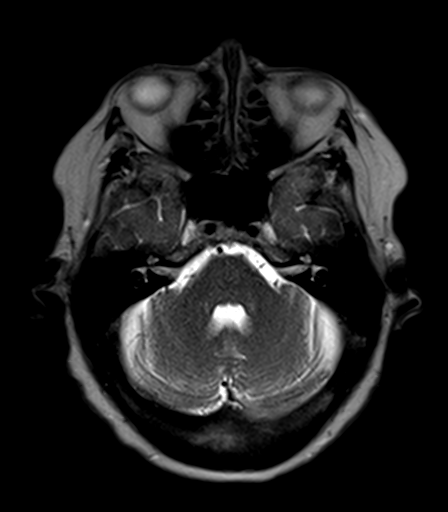
[im 18/27]
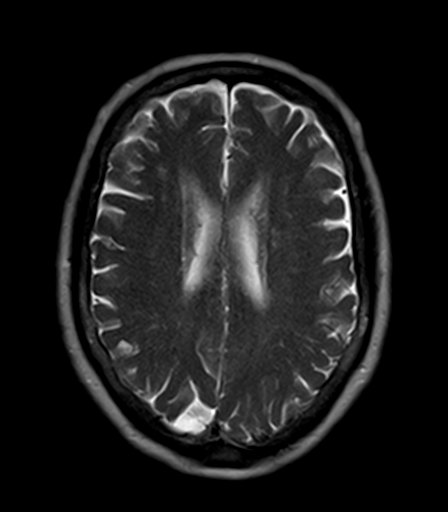
[im 27/27]
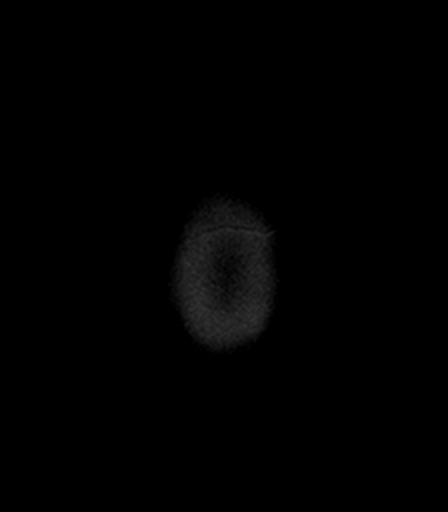

[Series 9: FLAIR · axial · 5.0mm · 1.20mm/px · z∈[-42,+113]mm · 4 of 27 slices shown]
[im 1/27]
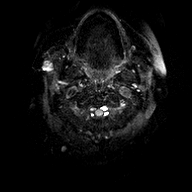
[im 9/27]
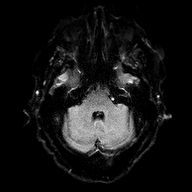
[im 18/27]
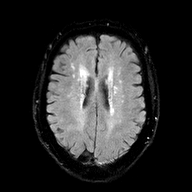
[im 27/27]
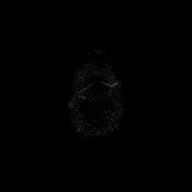

[Series 10: T1 · axial · 5.0mm · 0.90mm/px · z∈[-43,+113]mm · 4 of 27 slices shown (2 of 2)]
[im 1/27]
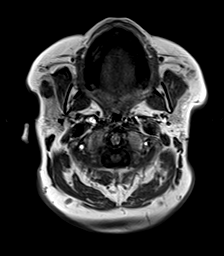
[im 9/27]
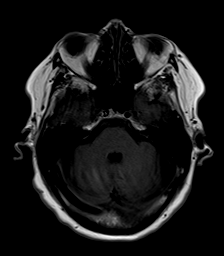
[im 18/27]
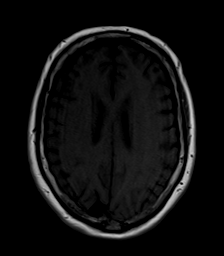
[im 27/27]
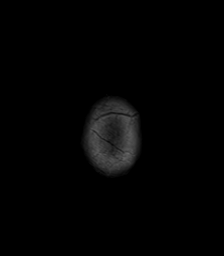

[Series 11: T2 · coronal · 5.0mm · 0.45mm/px · 5 of 33 slices shown (2 of 2)]
[im 1/33]
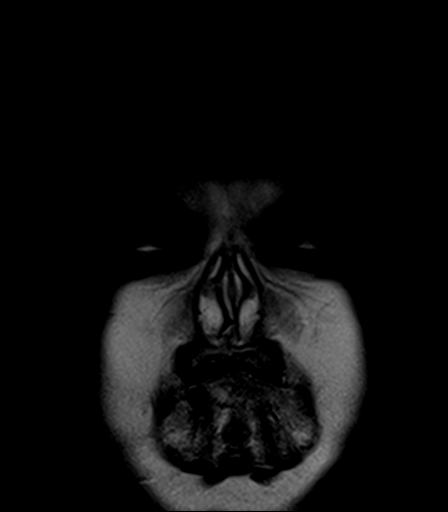
[im 9/33]
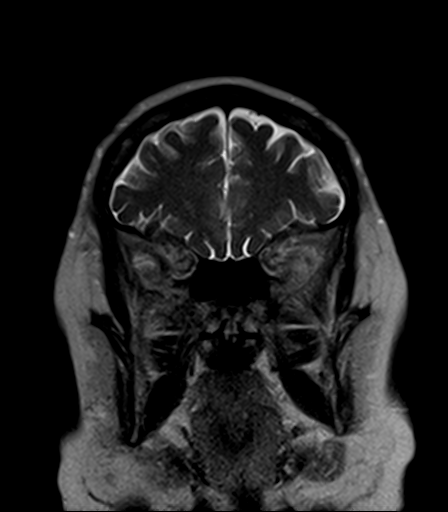
[im 17/33]
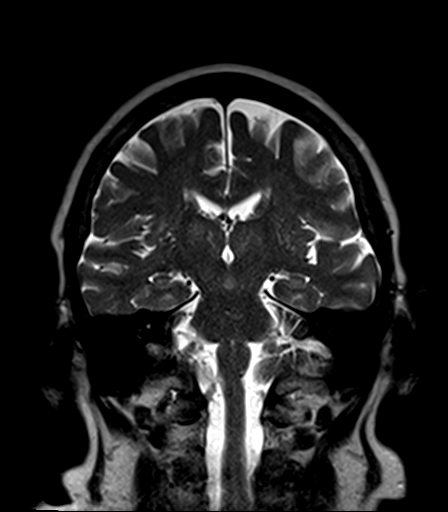
[im 25/33]
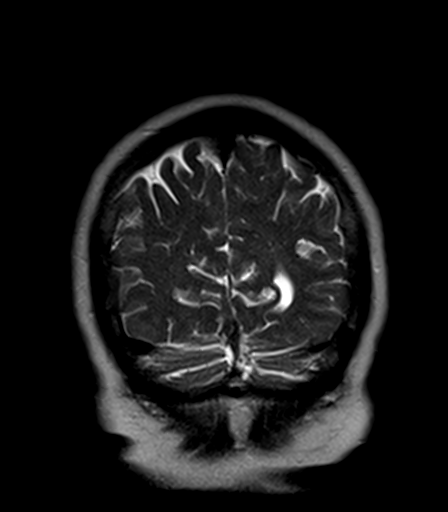
[im 33/33]
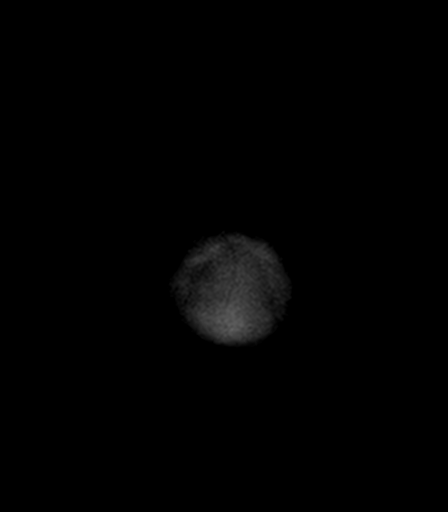

[41 of 48 positions shown; findings below may reference images not displayed]

FINDINGS: BRAIN: There is no acute infarct, acute hemorrhage or extra-axial
collection. The midline structures are normal. No midline shift or
other mass effect. Multifocal white matter hyperintensity, most
commonly due to chronic ischemic microangiopathy. The cerebral and
cerebellar volume are age-appropriate. No hydrocephalus.
Susceptibility-sensitive sequences show no chronic microhemorrhage
or superficial siderosis. No mass lesion.

VASCULAR: The major intracranial arterial and venous sinus flow
voids are normal.

SKULL AND UPPER CERVICAL SPINE: Calvarial bone marrow signal is
normal. There is no skull base mass. Visualized upper cervical spine
and soft tissues are normal.

SINUSES/ORBITS: No fluid levels or advanced mucosal thickening. No
mastoid or middle ear effusion. The orbits are normal.
IMPRESSION: Chronic small vessel ischemia without acute intracranial
abnormality.

## 2020-04-07 ENCOUNTER — Other Ambulatory Visit: Payer: Self-pay | Admitting: Family Medicine

## 2020-04-07 DIAGNOSIS — D72819 Decreased white blood cell count, unspecified: Secondary | ICD-10-CM

## 2020-04-07 DIAGNOSIS — E876 Hypokalemia: Secondary | ICD-10-CM

## 2020-04-09 DIAGNOSIS — M47812 Spondylosis without myelopathy or radiculopathy, cervical region: Secondary | ICD-10-CM | POA: Diagnosis not present

## 2020-04-09 DIAGNOSIS — M5412 Radiculopathy, cervical region: Secondary | ICD-10-CM | POA: Diagnosis not present

## 2020-04-09 DIAGNOSIS — M4802 Spinal stenosis, cervical region: Secondary | ICD-10-CM | POA: Diagnosis not present

## 2020-04-10 ENCOUNTER — Other Ambulatory Visit: Payer: Self-pay

## 2020-04-10 ENCOUNTER — Other Ambulatory Visit (INDEPENDENT_AMBULATORY_CARE_PROVIDER_SITE_OTHER): Payer: Medicare Other

## 2020-04-10 DIAGNOSIS — D72819 Decreased white blood cell count, unspecified: Secondary | ICD-10-CM

## 2020-04-10 DIAGNOSIS — E876 Hypokalemia: Secondary | ICD-10-CM

## 2020-04-10 LAB — CBC WITH DIFFERENTIAL/PLATELET
Basophils Absolute: 0 10*3/uL (ref 0.0–0.1)
Basophils Relative: 0.1 % (ref 0.0–3.0)
Eosinophils Absolute: 0 10*3/uL (ref 0.0–0.7)
Eosinophils Relative: 0.1 % (ref 0.0–5.0)
HCT: 40.6 % (ref 36.0–46.0)
Hemoglobin: 13.8 g/dL (ref 12.0–15.0)
Lymphocytes Relative: 10.6 % — ABNORMAL LOW (ref 12.0–46.0)
Lymphs Abs: 0.7 10*3/uL (ref 0.7–4.0)
MCHC: 34 g/dL (ref 30.0–36.0)
MCV: 99.1 fl (ref 78.0–100.0)
Monocytes Absolute: 0.4 10*3/uL (ref 0.1–1.0)
Monocytes Relative: 5.6 % (ref 3.0–12.0)
Neutro Abs: 5.9 10*3/uL (ref 1.4–7.7)
Neutrophils Relative %: 83.6 % — ABNORMAL HIGH (ref 43.0–77.0)
Platelets: 192 10*3/uL (ref 150.0–400.0)
RBC: 4.1 Mil/uL (ref 3.87–5.11)
RDW: 14.4 % (ref 11.5–15.5)
WBC: 7.1 10*3/uL (ref 4.0–10.5)

## 2020-04-10 LAB — POTASSIUM: Potassium: 4.7 mEq/L (ref 3.5–5.1)

## 2020-04-10 NOTE — Progress Notes (Signed)
ap

## 2020-04-14 ENCOUNTER — Telehealth: Payer: Self-pay

## 2020-04-14 NOTE — Telephone Encounter (Signed)
-----   Message from Leone Haven, MD sent at 04/12/2020  7:00 AM EDT ----- Please let the patient know that her WBC has improved. She does have a higher percentage of neutrophils than normal and that could be caused by an infection if she's had one recently. Has she been sick recently?

## 2020-04-17 ENCOUNTER — Other Ambulatory Visit: Payer: Self-pay | Admitting: Family Medicine

## 2020-04-17 DIAGNOSIS — R7989 Other specified abnormal findings of blood chemistry: Secondary | ICD-10-CM

## 2020-04-23 DIAGNOSIS — M4802 Spinal stenosis, cervical region: Secondary | ICD-10-CM | POA: Diagnosis not present

## 2020-04-23 DIAGNOSIS — M5412 Radiculopathy, cervical region: Secondary | ICD-10-CM | POA: Diagnosis not present

## 2020-04-23 DIAGNOSIS — M47812 Spondylosis without myelopathy or radiculopathy, cervical region: Secondary | ICD-10-CM | POA: Diagnosis not present

## 2020-05-12 ENCOUNTER — Other Ambulatory Visit (INDEPENDENT_AMBULATORY_CARE_PROVIDER_SITE_OTHER): Payer: Medicare Other

## 2020-05-12 ENCOUNTER — Other Ambulatory Visit: Payer: Self-pay

## 2020-05-12 DIAGNOSIS — R7989 Other specified abnormal findings of blood chemistry: Secondary | ICD-10-CM | POA: Diagnosis not present

## 2020-05-12 LAB — CBC WITH DIFFERENTIAL/PLATELET
Basophils Absolute: 0 10*3/uL (ref 0.0–0.1)
Basophils Relative: 1.1 % (ref 0.0–3.0)
Eosinophils Absolute: 0.1 10*3/uL (ref 0.0–0.7)
Eosinophils Relative: 3.7 % (ref 0.0–5.0)
HCT: 42.9 % (ref 36.0–46.0)
Hemoglobin: 14.4 g/dL (ref 12.0–15.0)
Lymphocytes Relative: 23.6 % (ref 12.0–46.0)
Lymphs Abs: 0.9 10*3/uL (ref 0.7–4.0)
MCHC: 33.6 g/dL (ref 30.0–36.0)
MCV: 100.8 fl — ABNORMAL HIGH (ref 78.0–100.0)
Monocytes Absolute: 0.4 10*3/uL (ref 0.1–1.0)
Monocytes Relative: 11.2 % (ref 3.0–12.0)
Neutro Abs: 2.2 10*3/uL (ref 1.4–7.7)
Neutrophils Relative %: 60.4 % (ref 43.0–77.0)
Platelets: 176 10*3/uL (ref 150.0–400.0)
RBC: 4.25 Mil/uL (ref 3.87–5.11)
RDW: 13.7 % (ref 11.5–15.5)
WBC: 3.7 10*3/uL — ABNORMAL LOW (ref 4.0–10.5)

## 2020-05-14 ENCOUNTER — Other Ambulatory Visit: Payer: Self-pay | Admitting: Family Medicine

## 2020-05-14 DIAGNOSIS — D7589 Other specified diseases of blood and blood-forming organs: Secondary | ICD-10-CM

## 2020-05-14 DIAGNOSIS — D72819 Decreased white blood cell count, unspecified: Secondary | ICD-10-CM

## 2020-05-18 ENCOUNTER — Other Ambulatory Visit (INDEPENDENT_AMBULATORY_CARE_PROVIDER_SITE_OTHER): Payer: Medicare Other

## 2020-05-18 ENCOUNTER — Other Ambulatory Visit: Payer: Self-pay

## 2020-05-18 DIAGNOSIS — D72819 Decreased white blood cell count, unspecified: Secondary | ICD-10-CM

## 2020-05-18 DIAGNOSIS — D7589 Other specified diseases of blood and blood-forming organs: Secondary | ICD-10-CM

## 2020-05-19 ENCOUNTER — Other Ambulatory Visit: Payer: Self-pay | Admitting: Family Medicine

## 2020-05-19 DIAGNOSIS — D7589 Other specified diseases of blood and blood-forming organs: Secondary | ICD-10-CM

## 2020-05-19 LAB — CBC WITH DIFFERENTIAL/PLATELET
Basophils Absolute: 0.1 10*3/uL (ref 0.0–0.1)
Basophils Relative: 1.1 % (ref 0.0–3.0)
Eosinophils Absolute: 0.1 10*3/uL (ref 0.0–0.7)
Eosinophils Relative: 2.1 % (ref 0.0–5.0)
HCT: 42.1 % (ref 36.0–46.0)
Hemoglobin: 14.6 g/dL (ref 12.0–15.0)
Lymphocytes Relative: 19.5 % (ref 12.0–46.0)
Lymphs Abs: 1 10*3/uL (ref 0.7–4.0)
MCHC: 34.6 g/dL (ref 30.0–36.0)
MCV: 100.1 fl — ABNORMAL HIGH (ref 78.0–100.0)
Monocytes Absolute: 0.5 10*3/uL (ref 0.1–1.0)
Monocytes Relative: 9.2 % (ref 3.0–12.0)
Neutro Abs: 3.4 10*3/uL (ref 1.4–7.7)
Neutrophils Relative %: 68.1 % (ref 43.0–77.0)
Platelets: 168 10*3/uL (ref 150.0–400.0)
RBC: 4.21 Mil/uL (ref 3.87–5.11)
RDW: 13.1 % (ref 11.5–15.5)
WBC: 4.9 10*3/uL (ref 4.0–10.5)

## 2020-05-19 LAB — VITAMIN B12: Vitamin B-12: 423 pg/mL (ref 211–911)

## 2020-05-19 LAB — FOLATE: Folate: >24.8 ng/mL (ref >5.9)

## 2020-06-04 ENCOUNTER — Other Ambulatory Visit: Payer: Medicare Other

## 2020-06-15 ENCOUNTER — Other Ambulatory Visit: Payer: Self-pay | Admitting: Family Medicine

## 2020-06-15 DIAGNOSIS — I10 Essential (primary) hypertension: Secondary | ICD-10-CM

## 2020-07-28 DIAGNOSIS — M7989 Other specified soft tissue disorders: Secondary | ICD-10-CM | POA: Diagnosis not present

## 2020-07-28 DIAGNOSIS — I83811 Varicose veins of right lower extremities with pain: Secondary | ICD-10-CM | POA: Diagnosis not present

## 2020-08-04 DIAGNOSIS — M7989 Other specified soft tissue disorders: Secondary | ICD-10-CM | POA: Diagnosis not present

## 2020-08-04 DIAGNOSIS — I83812 Varicose veins of left lower extremities with pain: Secondary | ICD-10-CM | POA: Diagnosis not present

## 2020-08-06 ENCOUNTER — Ambulatory Visit (INDEPENDENT_AMBULATORY_CARE_PROVIDER_SITE_OTHER): Payer: Medicare Other | Admitting: Vascular Surgery

## 2020-09-01 ENCOUNTER — Ambulatory Visit: Payer: Medicare Other | Admitting: Family Medicine

## 2020-09-01 DIAGNOSIS — M7989 Other specified soft tissue disorders: Secondary | ICD-10-CM | POA: Diagnosis not present

## 2020-09-01 DIAGNOSIS — I83811 Varicose veins of right lower extremities with pain: Secondary | ICD-10-CM | POA: Diagnosis not present

## 2020-09-08 DIAGNOSIS — I83812 Varicose veins of left lower extremities with pain: Secondary | ICD-10-CM | POA: Diagnosis not present

## 2020-09-08 DIAGNOSIS — M7989 Other specified soft tissue disorders: Secondary | ICD-10-CM | POA: Diagnosis not present

## 2020-09-11 ENCOUNTER — Ambulatory Visit (INDEPENDENT_AMBULATORY_CARE_PROVIDER_SITE_OTHER): Payer: Medicare Other | Admitting: Family Medicine

## 2020-09-11 ENCOUNTER — Encounter: Payer: Self-pay | Admitting: Family Medicine

## 2020-09-11 ENCOUNTER — Other Ambulatory Visit: Payer: Self-pay

## 2020-09-11 VITALS — BP 120/80 | HR 90 | Temp 97.5°F | Ht 63.0 in | Wt 240.6 lb

## 2020-09-11 DIAGNOSIS — Z780 Asymptomatic menopausal state: Secondary | ICD-10-CM | POA: Diagnosis not present

## 2020-09-11 DIAGNOSIS — M810 Age-related osteoporosis without current pathological fracture: Secondary | ICD-10-CM | POA: Diagnosis not present

## 2020-09-11 DIAGNOSIS — I10 Essential (primary) hypertension: Secondary | ICD-10-CM | POA: Diagnosis not present

## 2020-09-11 DIAGNOSIS — Z1231 Encounter for screening mammogram for malignant neoplasm of breast: Secondary | ICD-10-CM | POA: Diagnosis not present

## 2020-09-11 DIAGNOSIS — M5030 Other cervical disc degeneration, unspecified cervical region: Secondary | ICD-10-CM | POA: Diagnosis not present

## 2020-09-11 DIAGNOSIS — R0789 Other chest pain: Secondary | ICD-10-CM | POA: Diagnosis not present

## 2020-09-11 DIAGNOSIS — Z23 Encounter for immunization: Secondary | ICD-10-CM | POA: Diagnosis not present

## 2020-09-11 NOTE — Assessment & Plan Note (Signed)
DEXA scan ordered.  She will continue calcium and vitamin D.

## 2020-09-11 NOTE — Assessment & Plan Note (Addendum)
Very atypical.  Suspect musculoskeletal versus stress related.  EKG is reassuring.  She will monitor.  If she develops typical cardiac symptoms she will seek medical attention immediately.

## 2020-09-11 NOTE — Assessment & Plan Note (Signed)
Reports stable symptoms related to this.  Occasional hand numbness.  She is following with a specialist and they have given her injection which was beneficial.  They are monitoring.

## 2020-09-11 NOTE — Patient Instructions (Signed)
Nice to see you. Please call to schedule your mammogram and bone density scan. Please monitor your chest discomfort.  If you have persisting discomfort or you develop chest pressure, shortness of breath, or sweatiness with this please seek medical attention immediately. We will contact you with your lab results.

## 2020-09-11 NOTE — Progress Notes (Signed)
Tommi Rumps, MD Phone: [phone]  Ann Spencer is a 79 y.o. female who presents today for f/u.  HYPERTENSION  Disease Monitoring  Home BP Monitoring similar to today Chest pain- see below    Dyspnea- no Medications  Compliance-  Taking HCTZ.   Edema- chronic and unchanged  Osteoporosis: Patient is taking calcium and vitamin D.  No fractures.  She is due for DEXA scan.  Chest discomfort: Patient notes intermittent twinges in her bilateral chest.  They occur intermittently.  On the days that they do occur they occur intermittently throughout the day.  Last occurred 2 days ago.  No sharp pain.  No chest pressure.  No shortness of breath.  No diaphoresis.  No radiation.  No exertional component.  She sometimes feel it is related to stress.    Social History   Tobacco Use  Smoking Status Former Smoker  . Packs/day: 1.00  . Years: 30.00  . Pack years: 30.00  . Types: Cigarettes  . Quit date: 12/12/1993  . Years since quitting: 26.7  Smokeless Tobacco Never Used     ROS see history of present illness  Objective  Physical Exam Vitals:   09/11/20 1354  BP: 120/80  Pulse: 90  Temp: (!) 97.5 F (36.4 C)  SpO2: 93%    BP Readings from Last 3 Encounters:  09/11/20 120/80  02/06/20 (!) 143/77  01/29/20 (!) 148/87   Wt Readings from Last 3 Encounters:  09/11/20 240 lb 9.6 oz (109.1 kg)  02/17/20 229 lb (103.9 kg)  02/06/20 234 lb (106.1 kg)    Physical Exam Constitutional:      General: She is not in acute distress.    Appearance: She is not diaphoretic.  Cardiovascular:     Rate and Rhythm: Normal rate and regular rhythm.     Heart sounds: Normal heart sounds.  Pulmonary:     Effort: Pulmonary effort is normal.     Breath sounds: Normal breath sounds.  Musculoskeletal:     Right lower leg: No edema.     Left lower leg: No edema.  Skin:    General: Skin is warm and dry.  Neurological:     Mental Status: She is alert.    EKG: Normal sinus rhythm  with sinus arrhythmia, no acute ST or T wave changes   Assessment/Plan: Please see individual problem list.  Essential hypertension Adequate control.  Continue HCTZ 25 mg once daily.  Chest discomfort Very atypical.  Suspect musculoskeletal versus stress related.  EKG is reassuring.  She will monitor.  If she develops typical cardiac symptoms she will seek medical attention immediately.  DDD (degenerative disc disease), cervical Reports stable symptoms related to this.  Occasional hand numbness.  She is following with a specialist and they have given her injection which was beneficial.  They are monitoring.  Osteoporosis DEXA scan ordered.  She will continue calcium and vitamin D.     Orders Placed This Encounter  Procedures  . DG Bone Density    Standing Status:   Future    Standing Expiration Date:   09/11/2021    Order Specific Question:   Reason for Exam (SYMPTOM  OR DIAGNOSIS REQUIRED)    Answer:   Osteoporosis follow-up, postmenopausal estrogen deficiency    Order Specific Question:   Preferred imaging location?    Answer:   [facility]  . MM 3D SCREEN BREAST BILATERAL    Standing Status:   Future    Standing Expiration Date:  09/11/2021    Order Specific Question:   Reason for Exam (SYMPTOM  OR DIAGNOSIS REQUIRED)    Answer:   Breast cancer screening    Order Specific Question:   Preferred imaging location?    Answer:   [facility]  . Flu Vaccine QUAD High Dose(Fluad)  . CBC  . Comp Met (CMET)  . EKG 12-Lead    No orders of the defined types were placed in this encounter.   Dilana was seen today for follow-up.  Diagnoses and all orders for this visit:  Chest discomfort -     EKG 12-Lead -     Cancel: CBC -     Cancel: Comp Met (CMET) -     CBC -     Comp Met (CMET)  Postmenopausal estrogen deficiency -     DG Bone Density; Future  Encounter for screening mammogram for malignant neoplasm of breast -     MM 3D SCREEN BREAST BILATERAL;  Future  Essential hypertension  DDD (degenerative disc disease), cervical  Age-related osteoporosis without current pathological fracture -     DG Bone Density; Future  Need for immunization against influenza -     Flu Vaccine QUAD High Dose(Fluad)     This visit occurred during the SARS-CoV-2 public health emergency.  Safety protocols were in place, including screening questions prior to the visit, additional usage of staff PPE, and extensive cleaning of exam room while observing appropriate contact time as indicated for disinfecting solutions.    Tommi Rumps, MD [place]

## 2020-09-11 NOTE — Assessment & Plan Note (Signed)
Adequate control.  Continue HCTZ 25 mg once daily.

## 2020-09-12 LAB — COMPREHENSIVE METABOLIC PANEL
AG Ratio: 1.4 (calc) (ref 1.0–2.5)
ALT: 24 U/L (ref 6–29)
AST: 19 U/L (ref 10–35)
Albumin: 4.1 g/dL (ref 3.6–5.1)
Alkaline phosphatase (APISO): 62 U/L (ref 37–153)
BUN: 18 mg/dL (ref 7–25)
CO2: 31 mmol/L (ref 20–32)
Calcium: 10.1 mg/dL (ref 8.6–10.4)
Chloride: 96 mmol/L — ABNORMAL LOW (ref 98–110)
Creat: 0.85 mg/dL (ref 0.60–0.93)
Globulin: 3 g/dL (calc) (ref 1.9–3.7)
Glucose, Bld: 116 mg/dL — ABNORMAL HIGH (ref 65–99)
Potassium: 4.3 mmol/L (ref 3.5–5.3)
Sodium: 135 mmol/L (ref 135–146)
Total Bilirubin: 0.7 mg/dL (ref 0.2–1.2)
Total Protein: 7.1 g/dL (ref 6.1–8.1)

## 2020-09-12 LAB — CBC
HCT: 43.5 % (ref 35.0–45.0)
Hemoglobin: 15 g/dL (ref 11.7–15.5)
MCH: 34.2 pg — ABNORMAL HIGH (ref 27.0–33.0)
MCHC: 34.5 g/dL (ref 32.0–36.0)
MCV: 99.3 fL (ref 80.0–100.0)
MPV: 10.3 fL (ref 7.5–12.5)
Platelets: 163 10*3/uL (ref 140–400)
RBC: 4.38 10*6/uL (ref 3.80–5.10)
RDW: 11.6 % (ref 11.0–15.0)
WBC: 4.8 10*3/uL (ref 3.8–10.8)

## 2020-10-13 ENCOUNTER — Telehealth: Payer: Self-pay | Admitting: Family Medicine

## 2020-10-13 NOTE — Telephone Encounter (Signed)
I called and spoke with the patient and asked her of her symptoms, she would like an antibiotic called in.  She tested positive on Monday, her daughter tested positive last Wednesday and was unvaccinated, the patient was vaccinated with Mederma.  Her symptoms are cough, no fever, achy muscles, no taste or smell and h/a, mostly head congestion and fatigue.  Degan Hanser,cma

## 2020-10-13 NOTE — Telephone Encounter (Signed)
I called and spoke with the patient and she stated she is ok without the antibiotic, her daughter felt she needed one and she stated she is ok she just would like something for her nose, she is really sopped up in the nose area and wanted to know what she could do for that, that's her only concern right now.  She was informed to sty in quarantine and she understood.  And to stay hydrated.  Ann Spencer,cma

## 2020-10-13 NOTE — Telephone Encounter (Signed)
An antibiotic is not likely to do anything for a COVID19 infection. We could have her complete a virtual visit to see if she might need one. Otherwise it is supportive care with staying hydrated and monitoring for fever, shortness of breath, or worsening symptoms. She needs to stay quarantined at home with no visitors until she is at least 10 days from symptom onset and has had 24 hours with no fevers and improved symptoms.

## 2020-10-13 NOTE — Telephone Encounter (Signed)
Patient's daughter called in mother tested positive for covid on Monday 11-1 wanted to know what she should can she get the antibiotics.

## 2020-10-14 NOTE — Telephone Encounter (Signed)
She could try adding Claritin and Flonase as those will likely help with her nasal symptoms.  She should have somebody pick those up from the pharmacy for her and leave them at her front door without coming in contact with her.  If not beneficial she should let us know.

## 2020-10-19 ENCOUNTER — Other Ambulatory Visit: Payer: Self-pay | Admitting: Family Medicine

## 2020-10-19 DIAGNOSIS — I10 Essential (primary) hypertension: Secondary | ICD-10-CM

## 2020-10-19 NOTE — Telephone Encounter (Signed)
I called and informed the patient that the provider sent over Flonase and Claritin to the pharmacy and if it does not help to let us know.  She understood.  Trenell Moxey,cma

## 2020-10-28 ENCOUNTER — Telehealth: Payer: Self-pay | Admitting: Family Medicine

## 2020-10-28 NOTE — Telephone Encounter (Signed)
How is she currently feeling?  Did she get a monoclonal antibody infusion?  What was the date of her second Covid vaccine?

## 2020-10-28 NOTE — Telephone Encounter (Signed)
Pt wanted to know when and if she could get the Covid booster since she tested positive for Covid on 10/14/20.  Ann Spencer,cma

## 2020-10-28 NOTE — Telephone Encounter (Signed)
Pt wanted to know when and if she could get the Covid booster since she tested positive for Covid on 10/14/20

## 2020-10-29 NOTE — Telephone Encounter (Signed)
I called and spoke with the patient and informed her that the provider suggested she waited at least 90 days before getting the booster, or until she is fully recovered.  Patient understood.  Jahan Friedlander,cma

## 2020-10-29 NOTE — Telephone Encounter (Signed)
She has 90 days from the onset of her symptoms where she will have some protection from her recent infection. She can wait until the end of the 90 days or she can get it sooner, though I would advise that she wait until she has fully recovered prior to getting the booster vaccine. Thanks.

## 2020-10-29 NOTE — Telephone Encounter (Signed)
I called and spoke with the pateint and asked her how she is feeling, patient stated she is feeling better, she still does not have her taste back she can taste a little and fatigue.  Patient stated she did not get the monoclonal antibody infusion and she got her second vaccine on March 15,2021?  Truman Aceituno,cma

## 2020-11-12 ENCOUNTER — Encounter: Payer: Self-pay | Admitting: Family Medicine

## 2020-12-01 DIAGNOSIS — I83813 Varicose veins of bilateral lower extremities with pain: Secondary | ICD-10-CM | POA: Diagnosis not present

## 2021-01-19 ENCOUNTER — Ambulatory Visit (INDEPENDENT_AMBULATORY_CARE_PROVIDER_SITE_OTHER): Payer: Medicare HMO

## 2021-01-19 VITALS — BP 127/81 | Ht 63.0 in | Wt 235.0 lb

## 2021-01-19 DIAGNOSIS — Z0000 Encounter for general adult medical examination without abnormal findings: Secondary | ICD-10-CM

## 2021-01-19 NOTE — Patient Instructions (Addendum)
Ann Spencer , Thank you for taking time to come for your Medicare Wellness Visit. I appreciate your ongoing commitment to your health goals. Please review the following plan we discussed and let me know if I can assist you in the future.   These are the goals we discussed: Goals      Patient Stated   .  Weight (lb) < 233 lb 12.8 oz (106.1 kg) (pt-stated)      Drink less wine Low cholesterol/low carb diet       This is a list of the screening recommended for you and due dates:  Health Maintenance  Topic Date Due  . Pneumonia vaccines (2 of 2 - PPSV23) 02/20/2016  . COVID-19 Vaccine (2 - Moderna 3-dose series) 02/23/2020  . Tetanus Vaccine  12/22/2023  . Flu Shot  Completed  . DEXA scan (bone density measurement)  Completed  .  Hepatitis C: One time screening is recommended by Center for Disease Control  (CDC) for  adults born from [birth year] through 1964.   Discontinued    Immunizations Immunization History  Administered Date(s) Administered  . Fluad Quad(high Dose 65+) 10/03/2019, 09/11/2020  . Influenza, High Dose Seasonal PF 01/10/2017, 09/04/2017, 09/07/2018  . Moderna Sars-Covid-2 Vaccination 01/26/2020  . Pneumococcal Conjugate-13 02/20/2015  . Tdap 12/21/2013   Advanced directives: not yet completed.  Conditions/risks identified: none new.  Follow up in one year for your annual wellness visit.   Preventive Care 60 Years and Older, Female Preventive care refers to lifestyle choices and visits with your health care provider that can promote health and wellness. What does preventive care include?  A yearly physical exam. This is also called an annual well check.  Dental exams once or twice a year.  Routine eye exams. Ask your health care provider how often you should have your eyes checked.  Personal lifestyle choices, including:  Daily care of your teeth and gums.  Regular physical activity.  Eating a healthy diet.  Avoiding tobacco and drug use.  Limiting  alcohol use.  Practicing safe sex.  Taking low-dose aspirin every day.  Taking vitamin and mineral supplements as recommended by your health care provider. What happens during an annual well check? The services and screenings done by your health care provider during your annual well check will depend on your age, overall health, lifestyle risk factors, and family history of disease. Counseling  Your health care provider may ask you questions about your:  Alcohol use.  Tobacco use.  Drug use.  Emotional well-being.  Home and relationship well-being.  Sexual activity.  Eating habits.  History of falls.  Memory and ability to understand (cognition).  Work and work Statistician.  Reproductive health. Screening  You may have the following tests or measurements:  Height, weight, and BMI.  Blood pressure.  Lipid and cholesterol levels. These may be checked every 5 years, or more frequently if you are over 3 years old.  Skin check.  Lung cancer screening. You may have this screening every year starting at age 58 if you have a 30-pack-year history of smoking and currently smoke or have quit within the past 15 years.  Fecal occult blood test (FOBT) of the stool. You may have this test every year starting at age 3.  Flexible sigmoidoscopy or colonoscopy. You may have a sigmoidoscopy every 5 years or a colonoscopy every 10 years starting at age 62.  Hepatitis C blood test.  Hepatitis B blood test.  Sexually transmitted disease (STD) testing.  Diabetes screening. This is done by checking your blood sugar (glucose) after you have not eaten for a while (fasting). You may have this done every 1-3 years.  Bone density scan. This is done to screen for osteoporosis. You may have this done starting at age 71.  Mammogram. This may be done every 1-2 years. Talk to your health care provider about how often you should have regular mammograms. Talk with your health care provider  about your test results, treatment options, and if necessary, the need for more tests. Vaccines  Your health care provider may recommend certain vaccines, such as:  Influenza vaccine. This is recommended every year.  Tetanus, diphtheria, and acellular pertussis (Tdap, Td) vaccine. You may need a Td booster every 10 years.  Zoster vaccine. You may need this after age 36.  Pneumococcal 13-valent conjugate (PCV13) vaccine. One dose is recommended after age 41.  Pneumococcal polysaccharide (PPSV23) vaccine. One dose is recommended after age 55. Talk to your health care provider about which screenings and vaccines you need and how often you need them. This information is not intended to replace advice given to you by your health care provider. Make sure you discuss any questions you have with your health care provider. Document Released: 12/25/2015 Document Revised: 08/17/2016 Document Reviewed: 09/29/2015 Elsevier Interactive Patient Education  2017 [place] Prevention in the Home Falls can cause injuries. They can happen to people of all ages. There are many things you can do to make your home safe and to help prevent falls. What can I do on the outside of my home?  Regularly fix the edges of walkways and driveways and fix any cracks.  Remove anything that might make you trip as you walk through a door, such as a raised step or threshold.  Trim any bushes or trees on the path to your home.  Use bright outdoor lighting.  Clear any walking paths of anything that might make someone trip, such as rocks or tools.  Regularly check to see if handrails are loose or broken. Make sure that both sides of any steps have handrails.  Any raised decks and porches should have guardrails on the edges.  Have any leaves, snow, or ice cleared regularly.  Use sand or salt on walking paths during winter.  Clean up any spills in your garage right away. This includes oil or grease  spills. What can I do in the bathroom?  Use night lights.  Install grab bars by the toilet and in the tub and shower. Do not use towel bars as grab bars.  Use non-skid mats or decals in the tub or shower.  If you need to sit down in the shower, use a plastic, non-slip stool.  Keep the floor dry. Clean up any water that spills on the floor as soon as it happens.  Remove soap buildup in the tub or shower regularly.  Attach bath mats securely with double-sided non-slip rug tape.  Do not have throw rugs and other things on the floor that can make you trip. What can I do in the bedroom?  Use night lights.  Make sure that you have a light by your bed that is easy to reach.  Do not use any sheets or blankets that are too big for your bed. They should not hang down onto the floor.  Have a firm chair that has side arms. You can use this for support while you get dressed.  Do not have throw  rugs and other things on the floor that can make you trip. What can I do in the kitchen?  Clean up any spills right away.  Avoid walking on wet floors.  Keep items that you use a lot in easy-to-reach places.  If you need to reach something above you, use a strong step stool that has a grab bar.  Keep electrical cords out of the way.  Do not use floor polish or wax that makes floors slippery. If you must use wax, use non-skid floor wax.  Do not have throw rugs and other things on the floor that can make you trip. What can I do with my stairs?  Do not leave any items on the stairs.  Make sure that there are handrails on both sides of the stairs and use them. Fix handrails that are broken or loose. Make sure that handrails are as long as the stairways.  Check any carpeting to make sure that it is firmly attached to the stairs. Fix any carpet that is loose or worn.  Avoid having throw rugs at the top or bottom of the stairs. If you do have throw rugs, attach them to the floor with carpet  tape.  Make sure that you have a light switch at the top of the stairs and the bottom of the stairs. If you do not have them, ask someone to add them for you. What else can I do to help prevent falls?  Wear shoes that:  Do not have high heels.  Have rubber bottoms.  Are comfortable and fit you well.  Are closed at the toe. Do not wear sandals.  If you use a stepladder:  Make sure that it is fully opened. Do not climb a closed stepladder.  Make sure that both sides of the stepladder are locked into place.  Ask someone to hold it for you, if possible.  Clearly mark and make sure that you can see:  Any grab bars or handrails.  First and last steps.  Where the edge of each step is.  Use tools that help you move around (mobility aids) if they are needed. These include:  Canes.  Walkers.  Scooters.  Crutches.  Turn on the lights when you go into a dark area. Replace any light bulbs as soon as they burn out.  Set up your furniture so you have a clear path. Avoid moving your furniture around.  If any of your floors are uneven, fix them.  If there are any pets around you, be aware of where they are.  Review your medicines with your doctor. Some medicines can make you feel dizzy. This can increase your chance of falling. Ask your doctor what other things that you can do to help prevent falls. This information is not intended to replace advice given to you by your health care provider. Make sure you discuss any questions you have with your health care provider. Document Released: 09/24/2009 Document Revised: 05/05/2016 Document Reviewed: 01/02/2015 Elsevier Interactive Patient Education  2017 [employer].

## 2021-01-19 NOTE — Progress Notes (Signed)
Subjective:   Ann Spencer is a 80 y.o. female who presents for Medicare Annual (Subsequent) preventive examination.  Review of Systems    No ROS.  Medicare Wellness Virtual Visit.    Cardiac Risk Factors include: advanced age (>81men, >1 women);hypertension     Objective:    Today's Vitals   01/19/21 1104  BP: 127/81  Weight: 235 lb (106.6 kg)  Height: 5\' 3"  (1.6 m)   Body mass index is 41.63 kg/m.  Advanced Directives 01/19/2021 01/17/2020 11/23/2019 11/22/2019 11/06/2019 11/05/2019 11/05/2019  Does Patient Have a Medical Advance Directive? No No No No - No No  Would patient like information on creating a medical advance directive? No - Patient declined No - Patient declined No - Patient declined No - Patient declined (No Data) Yes (Inpatient - patient requests chaplain consult to create a medical advance directive) Yes (Inpatient - patient requests chaplain consult to create a medical advance directive)    Current Medications (verified) Outpatient Encounter Medications as of 01/19/2021  Medication Sig  . aspirin 81 MG tablet Take 81 mg by mouth daily.  . Cholecalciferol (D3 ADULT) 25 MCG (1000 UT) CHEW Chew 1,000 Units by mouth daily.   . ferrous sulfate 325 (65 FE) MG EC tablet Take 325 mg by mouth daily with breakfast.  . hydrochlorothiazide (HYDRODIURIL) 25 MG tablet Take 1 tablet by mouth once daily  . Misc Natural Products (OSTEO BI-FLEX JOINT SHIELD PO) Take 1 tablet by mouth daily.   . Multiple Vitamin (MULTIVITAMIN) capsule Take 1 capsule by mouth daily.  Marland Kitchen omeprazole (PRILOSEC) 20 MG capsule Take 20 mg by mouth as needed.   Facility-Administered Encounter Medications as of 01/19/2021  Medication  . lidocaine (PF) (XYLOCAINE) 1 % injection 10 mL    Allergies (verified) Penicillins   History: Past Medical History:  Diagnosis Date  . Allergy   . Anemia   . Arthritis   . Cellulitis   . GERD (gastroesophageal reflux disease)   . History of head injury  11/17/2015  . Hypertension   . PAD (peripheral artery disease) ([place])   . Phlebitis 2014  . Pneumonia due to infectious organism 02/15/2018  . Rotator cuff injury 12/2013   Past Surgical History:  Procedure Laterality Date  . ABDOMINAL HYSTERECTOMY  1981   Full  . COLONOSCOPY    . SHOULDER SURGERY Left 03/03/15  . TOTAL KNEE ARTHROPLASTY Left 11/05/2019   Procedure: TOTAL KNEE ARTHROPLASTY;  Surgeon: Thornton Park, MD;  Location: [facility] ORS;  Service: Orthopedics;  Laterality: Left;  . vein closure Bilateral Feb 2008   Family History  Problem Relation Age of Onset  . Varicose Veins Brother    Social History   Socioeconomic History  . Marital status: Widowed    Spouse name: Not on file  . Number of children: Not on file  . Years of education: Not on file  . Highest education level: Not on file  Occupational History  . Not on file  Tobacco Use  . Smoking status: Former Smoker    Packs/day: 1.00    Years: 30.00    Pack years: 30.00    Types: Cigarettes    Quit date: 12/12/1993    Years since quitting: 27.1  . Smokeless tobacco: Never Used  Vaping Use  . Vaping Use: Never used  Substance and Sexual Activity  . Alcohol use: Yes    Alcohol/week: 7.0 standard drinks    Types: 7 Standard drinks or equivalent per week  Comment: Wine qhs  . Drug use: No  . Sexual activity: Never  Other Topics Concern  . Not on file  Social History Narrative   [place]   Lives with daughter Beverlee Nims) and grandaughter    2 dogs lives inside   Enjoys reading   Social Determinants of Health   Financial Resource Strain: Low Risk   . Difficulty of Paying Living Expenses: Not hard at all  Food Insecurity: No Food Insecurity  . Worried About Charity fundraiser in the Last Year: Never true  . Ran Out of Food in the Last Year: Never true  Transportation Needs: No Transportation Needs  . Lack of Transportation (Medical): No  . Lack of Transportation (Non-Medical): No  Physical  Activity: Insufficiently Active  . Days of Exercise per Week: 7 days  . Minutes of Exercise per Session: 20 min  Stress: No Stress Concern Present  . Feeling of Stress : Not at all  Social Connections: Unknown  . Frequency of Communication with Friends and Family: More than three times a week  . Frequency of Social Gatherings with Friends and Family: More than three times a week  . Attends Religious Services: Not on file  . Active Member of Clubs or Organizations: Not on file  . Attends Archivist Meetings: Not on file  . Marital Status: Widowed    Tobacco Counseling Counseling given: Not Answered   Clinical Intake:  Pre-visit preparation completed: Yes        Diabetes: No  How often do you need to have someone help you when you read instructions, pamphlets, or other written materials from your doctor or pharmacy?: 1 - Never    Interpreter Needed?: No      Activities of Daily Living In your present state of health, do you have any difficulty performing the following activities: 01/19/2021  Hearing? N  Vision? N  Difficulty concentrating or making decisions? N  Walking or climbing stairs? N  Comment Paces herself  Dressing or bathing? N  Doing errands, shopping? N  Preparing Food and eating ? N  Using the Toilet? N  In the past six months, have you accidently leaked urine? Y  Comment Managed with daily liner  Do you have problems with loss of bowel control? N  Managing your Medications? N  Managing your Finances? N  Housekeeping or managing your Housekeeping? N  Some recent data might be hidden    Patient Care Team: Leone Haven, MD as PCP - General (Family Medicine) Christene Lye, MD (General Surgery) Leward Quan, MD (Family Medicine) Rubbie Battiest, RN (Inactive) as Nurse Practitioner (Gerontology)  Indicate any recent Medical Services you may have received from other than [facility] providers in the past year (date may be  approximate).     Assessment:   This is a routine wellness examination for Ann Spencer.  I connected with [place] today by telephone and verified that I am speaking with the correct person using two identifiers. Location patient: home Location provider: work Persons participating in the virtual visit: patient, Marine scientist.    I discussed the limitations, risks, security and privacy concerns of performing an evaluation and management service by telephone and the availability of in person appointments. The patient expressed understanding and verbally consented to this telephonic visit.    Interactive audio and video telecommunications were attempted between this provider and patient, however failed, due to patient having technical difficulties OR patient did not have access to video capability.  We continued and completed visit with audio only.  Some vital signs may be absent or patient reported.   Hearing/Vision screen  Hearing Screening   125Hz  250Hz  500Hz  1000Hz  2000Hz  3000Hz  4000Hz  6000Hz  8000Hz   Right ear:           Left ear:           Comments: Patient is able to hear conversational tones without difficulty.  No issues reported.  Vision Screening Comments: Wears corrective lenses  Visual acuity not assessed, virtual visit. They have seen their ophthalmologist in the last 12 months.   Dietary issues and exercise activities discussed: Current Exercise Habits: Home exercise routine, Type of exercise: walking, Intensity: Mild  Healthy diet Good water intake  Goals      Patient Stated   .  Weight (lb) < 233 lb 12.8 oz (106.1 kg) (pt-stated)      Drink less wine Low cholesterol/low carb diet      Depression Screen PHQ 2/9 Scores 01/19/2021 09/11/2020 01/17/2020 11/27/2019 01/08/2019 02/13/2018 12/14/2016  PHQ - 2 Score 0 0 0 0 0 0 0  PHQ- 9 Score - - - - - - -    Fall Risk Fall Risk  01/19/2021 09/11/2020 02/17/2020 11/27/2019 10/16/2019  Falls in the past year? 1 1 1  0 1  Comment - - - - -  Number  falls in past yr: 1 0 0 - 0  Injury with Fall? 0 0 0 - 1  Risk Factor Category  - - - - -  Risk for fall due to : - - - - -  Risk for fall due to: Comment - - - - -  Follow up Falls evaluation completed Falls evaluation completed Falls evaluation completed - Falls evaluation completed    FALL RISK PREVENTION PERTAINING TO THE HOME: Handrails in use when climbing stairs?Yes Home free of loose throw rugs in walkways, pet beds, electrical cords, etc? Yes  Adequate lighting in your home to reduce risk of falls? Yes   ASSISTIVE DEVICES UTILIZED TO PREVENT FALLS: Cell phone on person at all times? Yes  Use of a cane, walker or w/c? Yes , cane as needed  TIMED UP AND GO: Was the test performed? No . Virtual visit.    Cognitive Function: Patient is alert and oriented x3.   Enjoys reading, solitaire, word search, crossword puzzles, Time magazine and other brain stimulating activities.  MMSE/6CIT deferred. Normal by direct communication/observation.    6CIT Screen 01/17/2020 01/08/2019  What Year? 0 points 0 points  What month? 0 points 0 points  What time? 0 points 0 points  Count back from 20 0 points 0 points  Months in reverse 0 points 0 points  Repeat phrase 0 points 0 points  Total Score 0 0    Immunizations Immunization History  Administered Date(s) Administered  . Fluad Quad(high Dose 65+) 10/03/2019, 09/11/2020  . Influenza, High Dose Seasonal PF 01/10/2017, 09/04/2017, 09/07/2018  . Moderna Sars-Covid-2 Vaccination 01/26/2020  . Pneumococcal Conjugate-13 02/20/2015  . Tdap 12/21/2013   Health Maintenance Health Maintenance  Topic Date Due  . PNA vac Low Risk Adult (2 of 2 - PPSV23) 02/20/2016  . COVID-19 Vaccine (2 - Moderna 3-dose series) 02/23/2020  . TETANUS/TDAP  12/22/2023  . INFLUENZA VACCINE  Completed  . DEXA SCAN  Completed  . Hepatitis C Screening  Discontinued   Colorectal cancer screening: Type of screening: Cologuard. Completed 07/26/17. Repeat every 3  years  Mammogram- Ordered 09/11/20. Plans to schedule.  Bone density- Ordered 09/11/20. Plans to schedule.   Vision Screening: Recommended annual ophthalmology exams for early detection of glaucoma and other disorders of the eye. Is the patient up to date with their annual eye exam?  Yes   Dental Screening: Recommended annual dental exams for proper oral hygiene.  Community Resource Referral / Chronic Care Management: CRR required this visit?  No   CCM required this visit?  No      Plan:   Keep all routine maintenance appointments.   I have personally reviewed and noted the following in the patient's chart:   . Medical and social history . Use of alcohol, tobacco or illicit drugs  . Current medications and supplements . Functional ability and status . Nutritional status . Physical activity . Advanced directives . List of other physicians . Hospitalizations, surgeries, and ER visits in previous 12 months . Vitals . Screenings to include cognitive, depression, and falls . Referrals and appointments  In addition, I have reviewed and discussed with patient certain preventive protocols, quality metrics, and best practice recommendations. A written personalized care plan for preventive services as well as general preventive health recommendations were provided to patient via mychart.     Varney Biles, LPN   05/13/8240

## 2021-01-31 ENCOUNTER — Other Ambulatory Visit: Payer: Self-pay | Admitting: Family Medicine

## 2021-01-31 DIAGNOSIS — I10 Essential (primary) hypertension: Secondary | ICD-10-CM

## 2021-04-20 ENCOUNTER — Other Ambulatory Visit: Payer: Self-pay

## 2021-04-21 ENCOUNTER — Ambulatory Visit
Admission: RE | Admit: 2021-04-21 | Discharge: 2021-04-21 | Disposition: A | Discharge status: Home / Self Care | Payer: Medicare HMO | Source: Ambulatory Visit | Attending: Family Medicine | Admitting: Family Medicine

## 2021-04-21 DIAGNOSIS — M8589 Other specified disorders of bone density and structure, multiple sites: Secondary | ICD-10-CM | POA: Diagnosis not present

## 2021-04-21 DIAGNOSIS — M810 Age-related osteoporosis without current pathological fracture: Secondary | ICD-10-CM | POA: Insufficient documentation

## 2021-04-21 DIAGNOSIS — Z780 Asymptomatic menopausal state: Secondary | ICD-10-CM | POA: Insufficient documentation

## 2021-04-21 DIAGNOSIS — Z1231 Encounter for screening mammogram for malignant neoplasm of breast: Secondary | ICD-10-CM | POA: Insufficient documentation

## 2021-04-23 ENCOUNTER — Ambulatory Visit (INDEPENDENT_AMBULATORY_CARE_PROVIDER_SITE_OTHER): Payer: Medicare HMO | Admitting: Family Medicine

## 2021-04-23 ENCOUNTER — Other Ambulatory Visit: Payer: Self-pay

## 2021-04-23 ENCOUNTER — Encounter: Payer: Self-pay | Admitting: Family Medicine

## 2021-04-23 DIAGNOSIS — I10 Essential (primary) hypertension: Secondary | ICD-10-CM | POA: Diagnosis not present

## 2021-04-23 DIAGNOSIS — M25551 Pain in right hip: Secondary | ICD-10-CM | POA: Diagnosis not present

## 2021-04-23 DIAGNOSIS — K219 Gastro-esophageal reflux disease without esophagitis: Secondary | ICD-10-CM | POA: Diagnosis not present

## 2021-04-23 DIAGNOSIS — M810 Age-related osteoporosis without current pathological fracture: Secondary | ICD-10-CM | POA: Diagnosis not present

## 2021-04-23 LAB — BASIC METABOLIC PANEL
BUN: 17 mg/dL (ref 6–23)
CO2: 31 mEq/L (ref 19–32)
Calcium: 9.5 mg/dL (ref 8.4–10.5)
Chloride: 97 mEq/L (ref 96–112)
Creatinine, Ser: 0.81 mg/dL (ref 0.40–1.20)
GFR: 69.43 mL/min (ref >60.00)
Glucose, Bld: 100 mg/dL — ABNORMAL HIGH (ref 70–99)
Potassium: 4.3 mEq/L (ref 3.5–5.1)
Sodium: 138 mEq/L (ref 135–145)

## 2021-04-23 LAB — VITAMIN D 25 HYDROXY (VIT D DEFICIENCY, FRACTURES): VITD: 96.23 ng/mL (ref 30.00–100.00)

## 2021-04-23 NOTE — Patient Instructions (Signed)
Nice to see you. Please come back next week in the afternoon for an x-ray. Will contact you with your lab results and let you know when the Prolia is ready as long as it is approved by her insurance.

## 2021-04-23 NOTE — Assessment & Plan Note (Signed)
Well-controlled.  She will continue HCTZ 25 mg once daily.  We will check a BMP.

## 2021-04-23 NOTE — Progress Notes (Signed)
Tommi Rumps, MD Phone: [phone]  Ann Spencer is a 80 y.o. female who presents today for f/u.  HYPERTENSION  Disease Monitoring  Home BP Monitoring similar to today Chest pain- no    Dyspnea- no Medications  Compliance-  Taking HCTZ.  Edema- no  GERD:   Reflux symptoms: rarely if she eats something spicy   Distant history of gastric ulcer on EGD. No issues with that in many years.  Medication: prn omeprazole.  Osteoporosis: Diagnosed based on recent FRAX score.  She does take calcium and vitamin D.  Chronic right hip pain: Notes it hurts posteriorly in her right hip.  She has never seen orthopedics for this.  Notes it bothers her to sit for too long particularly riding in the car.  She does not take any medication for this.  Social History   Tobacco Use  Smoking Status Former Smoker  . Packs/day: 1.00  . Years: 30.00  . Pack years: 30.00  . Types: Cigarettes  . Quit date: 12/12/1993  . Years since quitting: 27.3  Smokeless Tobacco Never Used    Current Outpatient Medications on File Prior to Visit  Medication Sig Dispense Refill  . aspirin 81 MG tablet Take 81 mg by mouth daily.    . Cholecalciferol 25 MCG (1000 UT) CHEW Chew 1,000 Units by mouth daily.     . ferrous sulfate 325 (65 FE) MG EC tablet Take 325 mg by mouth daily with breakfast.    . hydrochlorothiazide (HYDRODIURIL) 25 MG tablet Take 1 tablet by mouth once daily 90 tablet 0  . Misc Natural Products (OSTEO BI-FLEX JOINT SHIELD PO) Take 1 tablet by mouth daily.     . Multiple Vitamin (MULTIVITAMIN) capsule Take 1 capsule by mouth daily.    Marland Kitchen omeprazole (PRILOSEC) 20 MG capsule Take 20 mg by mouth as needed.     Current Facility-Administered Medications on File Prior to Visit  Medication Dose Route Frequency Provider Last Rate Last Admin  . lidocaine (PF) (XYLOCAINE) 1 % injection 10 mL  10 mL Other Once Milinda Pointer, MD         ROS see history of present illness  Objective  Physical  Exam Vitals:   04/23/21 1349  BP: 130/60  Pulse: (!) 104  Temp: 98.1 F (36.7 C)  SpO2: 96%    BP Readings from Last 3 Encounters:  04/23/21 130/60  01/19/21 127/81  09/11/20 120/80   Wt Readings from Last 3 Encounters:  04/23/21 234 lb (106.1 kg)  01/19/21 235 lb (106.6 kg)  09/11/20 240 lb 9.6 oz (109.1 kg)    Physical Exam Constitutional:      General: She is not in acute distress.    Appearance: She is not diaphoretic.  Cardiovascular:     Rate and Rhythm: Normal rate and regular rhythm.     Heart sounds: Normal heart sounds.  Pulmonary:     Effort: Pulmonary effort is normal.     Breath sounds: Normal breath sounds.  Musculoskeletal:     Comments: Right hip with no tenderness laterally, there is good internal and external range of motion in the right hip with no discomfort  Skin:    General: Skin is warm and dry.  Neurological:     Mental Status: She is alert.      Assessment/Plan: Please see individual problem list.  Problem List Items Addressed This Visit    Essential hypertension    Well-controlled.  She will continue HCTZ 25 mg once daily.  We will check a BMP.      Relevant Orders   Basic Metabolic Panel (BMET)   Osteoporosis    Patient has osteoporosis based on FRAX score.  We will proceed with Prolia treatment if we can get this approved by her insurance.  We will check vitamin D and calcium levels today.  I did discuss the small risk of osteonecrosis of the jaw with the Prolia.  Advised that she should let us her dentist know about this medication when she gets reestablished with them.      Relevant Orders   Basic Metabolic Panel (BMET)   Vitamin D (25 hydroxy)   GERD (gastroesophageal reflux disease)    Rare symptoms.  She can continue omeprazole as needed.      Right hip pain    Possible osteoarthritis.  She will return for an x-ray next week.  We will determine neck step in management based on that.      Relevant Orders   DG Hip Unilat  W OR W/O Pelvis 2-3 Views Right      Return in about 6 months (around 10/24/2021).  This visit occurred during the SARS-CoV-2 public health emergency.  Safety protocols were in place, including screening questions prior to the visit, additional usage of staff PPE, and extensive cleaning of exam room while observing appropriate contact time as indicated for disinfecting solutions.    Tommi Rumps, MD [place]

## 2021-04-23 NOTE — Assessment & Plan Note (Signed)
Possible osteoarthritis.  She will return for an x-ray next week.  We will determine neck step in management based on that.

## 2021-04-23 NOTE — Assessment & Plan Note (Addendum)
Patient has osteoporosis based on FRAX score.  We will proceed with Prolia treatment if we can get this approved by her insurance.  We will check vitamin D and calcium levels today.  I did discuss the small risk of osteonecrosis of the jaw with the Prolia.  Advised that she should let us her dentist know about this medication when she gets reestablished with them.

## 2021-04-23 NOTE — Assessment & Plan Note (Signed)
Rare symptoms.  She can continue omeprazole as needed.

## 2021-04-28 ENCOUNTER — Other Ambulatory Visit: Payer: Medicare HMO

## 2021-04-28 ENCOUNTER — Other Ambulatory Visit: Payer: Self-pay

## 2021-04-28 ENCOUNTER — Ambulatory Visit (INDEPENDENT_AMBULATORY_CARE_PROVIDER_SITE_OTHER): Payer: Medicare HMO

## 2021-04-28 DIAGNOSIS — G8929 Other chronic pain: Secondary | ICD-10-CM | POA: Diagnosis not present

## 2021-04-28 DIAGNOSIS — M25551 Pain in right hip: Secondary | ICD-10-CM

## 2021-04-28 DIAGNOSIS — M1611 Unilateral primary osteoarthritis, right hip: Secondary | ICD-10-CM | POA: Diagnosis not present

## 2021-05-03 ENCOUNTER — Telehealth: Payer: Self-pay

## 2021-05-03 NOTE — Telephone Encounter (Signed)
Cologuard ordered per permission from patient on mychart.  Ann Spencer,cma

## 2021-05-06 ENCOUNTER — Other Ambulatory Visit: Payer: Self-pay | Admitting: Family Medicine

## 2021-05-06 DIAGNOSIS — I10 Essential (primary) hypertension: Secondary | ICD-10-CM

## 2021-06-03 ENCOUNTER — Telehealth: Payer: Self-pay | Admitting: *Deleted

## 2021-06-03 NOTE — Telephone Encounter (Signed)
-----   Message from Leone Haven, MD sent at 04/28/2021  4:43 PM EDT ----- Gae Bon, please let the patient know that her labs were acceptable.  Juliann Pulse, can we try to get this patient approved for Prolia?  Thanks.

## 2021-06-04 ENCOUNTER — Telehealth: Payer: Self-pay | Admitting: *Deleted

## 2021-06-04 NOTE — Telephone Encounter (Signed)
-----   Message from Leone Haven, MD sent at 04/28/2021  4:43 PM EDT ----- Ann Spencer, please let the patient know that her labs were acceptable.  Juliann Pulse, can we try to get this patient approved for Prolia?  Thanks.

## 2021-06-04 NOTE — Telephone Encounter (Signed)
Insurance verification for Prolia filed on [employer] Portal. 

## 2021-06-07 NOTE — Telephone Encounter (Signed)
PA started on cover my meds , but per insurance return no PA required. Okay to start Prolia?

## 2021-06-07 NOTE — Telephone Encounter (Signed)
Patient approved for Prolia .

## 2021-06-07 NOTE — Telephone Encounter (Signed)
It is ok to start prolia.

## 2021-07-06 NOTE — Telephone Encounter (Signed)
Patient has been scheduled for Prolia ion 07/28/21, patient aware of 20% co-pay for dollar amount to call insurance.

## 2021-07-28 ENCOUNTER — Other Ambulatory Visit: Payer: Self-pay

## 2021-07-28 ENCOUNTER — Ambulatory Visit (INDEPENDENT_AMBULATORY_CARE_PROVIDER_SITE_OTHER): Payer: Medicare HMO

## 2021-07-28 DIAGNOSIS — M810 Age-related osteoporosis without current pathological fracture: Secondary | ICD-10-CM | POA: Diagnosis not present

## 2021-07-28 MED ORDER — DENOSUMAB 60 MG/ML ~~LOC~~ SOSY
60.0000 mg | PREFILLED_SYRINGE | Freq: Once | SUBCUTANEOUS | Status: AC
  Administered 2021-07-28: 60 mg via SUBCUTANEOUS

## 2021-07-28 NOTE — Progress Notes (Signed)
Patient presented for 6-month Prolia injection SQ to right arm. Patient tolerated well. 

## 2021-08-12 ENCOUNTER — Other Ambulatory Visit: Payer: Self-pay | Admitting: Family Medicine

## 2021-08-12 DIAGNOSIS — I10 Essential (primary) hypertension: Secondary | ICD-10-CM

## 2021-10-13 ENCOUNTER — Ambulatory Visit (INDEPENDENT_AMBULATORY_CARE_PROVIDER_SITE_OTHER): Payer: Medicare HMO

## 2021-10-13 ENCOUNTER — Other Ambulatory Visit: Payer: Self-pay

## 2021-10-13 DIAGNOSIS — Z23 Encounter for immunization: Secondary | ICD-10-CM | POA: Diagnosis not present

## 2021-10-27 ENCOUNTER — Ambulatory Visit: Payer: Medicare HMO | Admitting: Family Medicine

## 2021-11-12 ENCOUNTER — Encounter: Payer: Self-pay | Admitting: Family Medicine

## 2021-11-12 ENCOUNTER — Telehealth (INDEPENDENT_AMBULATORY_CARE_PROVIDER_SITE_OTHER): Payer: Medicare HMO | Admitting: Family Medicine

## 2021-11-12 DIAGNOSIS — J069 Acute upper respiratory infection, unspecified: Secondary | ICD-10-CM

## 2021-11-12 MED ORDER — BENZONATATE 200 MG PO CAPS: 200.0000 mg | ORAL_CAPSULE | Freq: Three times a day (TID) | ORAL | 0 refills | Status: DC | PRN

## 2021-11-12 MED ORDER — GUAIFENESIN-CODEINE 100-10 MG/5ML PO SYRP: 10.0000 mL | ORAL_SOLUTION | Freq: Three times a day (TID) | ORAL | 0 refills | Status: DC | PRN

## 2021-11-12 NOTE — Progress Notes (Signed)
Virtual Visit via Video   I connected with patient on 11/12/21 at  9:30 AM EST by a video enabled telemedicine application and verified that I am speaking with the correct person using two identifiers.  Location patient: Home Location provider: [place], Office Persons participating in the virtual visit: Patient, Provider, [place] Claiborne Billings C)  I discussed the limitations of evaluation and management by telemedicine and the availability of in person appointments. The patient expressed understanding and agreed to proceed.  Subjective:   HPI:   URI- denies fever, sore throat.  'i just have a lot of congestion and cough'.  Sxs started 2 days ago.  She started Coricidin but 'it hasn't done any good'.  Denies HA, facial pain.  No ear pain.  Cough is productive of yellow sputum.  Some SOB w/ exertion and some wheezing.  Denies body aches and chills.  ROS:   See pertinent positives and negatives per HPI.  Patient Active Problem List   Diagnosis Date Noted   Right hip pain 04/23/2021   Chest discomfort 09/11/2020   Anemia 02/17/2020   Venous stasis dermatitis of both lower extremities 11/27/2019   Cellulitis of lower extremity 11/22/2019   S/P TKR (total knee replacement) using cement, left 11/05/2019   DDD (degenerative disc disease), cervical 08/29/2019   Shoulder pain 08/29/2019   Stiffness of shoulder joint 08/29/2019   Lymphedema 08/29/2019   Allergic rhinitis 03/08/2019   Left knee pain 09/07/2018   Hyperlipidemia 02/15/2018   Arm numbness left 10/25/2017   Left carpal tunnel syndrome 10/25/2017   Polyneuropathy 10/25/2017   Venous insufficiency 03/27/2017   Obesity 03/27/2017   Osteoarthritis of knee (Left) 11/23/2016   GERD (gastroesophageal reflux disease) 09/22/2016   Chronic knee pain (Bilateral) (L>R) 11/17/2015   Chronic pain 11/17/2015   Tricompartment osteoarthritis of knee (Left) 11/17/2015   Knee derangement (Left) 11/17/2015   Chronic low back pain  11/17/2015   Chronic neck pain 11/17/2015   Cervical spondylosis 11/17/2015   Lumbar spondylosis 11/17/2015   Osteoporosis 11/17/2015   Atherosclerotic peripheral vascular disease ([place]) 11/17/2015   Essential hypertension 02/24/2015   Full thickness rotator cuff tear 02/24/2015   Varicose veins of both lower extremities 09/03/2013    Social History   Tobacco Use   Smoking status: Former    Packs/day: 1.00    Years: 30.00    Pack years: 30.00    Types: Cigarettes    Quit date: 12/12/1993    Years since quitting: 27.9   Smokeless tobacco: Never  Substance Use Topics   Alcohol use: Yes    Alcohol/week: 7.0 standard drinks    Types: 7 Standard drinks or equivalent per week    Comment: Wine qhs    Current Outpatient Medications:    aspirin 81 MG tablet, Take 81 mg by mouth daily., Disp: , Rfl:    Cholecalciferol 25 MCG (1000 UT) CHEW, Chew 1,000 Units by mouth daily. , Disp: , Rfl:    ferrous sulfate 325 (65 FE) MG EC tablet, Take 325 mg by mouth daily with breakfast., Disp: , Rfl:    hydrochlorothiazide (HYDRODIURIL) 25 MG tablet, Take 1 tablet by mouth once daily, Disp: 90 tablet, Rfl: 0   Misc Natural Products (OSTEO BI-FLEX JOINT SHIELD PO), Take 1 tablet by mouth daily. , Disp: , Rfl:    Multiple Vitamin (MULTIVITAMIN) capsule, Take 1 capsule by mouth daily., Disp: , Rfl:    omeprazole (PRILOSEC) 20 MG capsule, Take 20 mg by mouth as needed., Disp: ,  Rfl:   Current Facility-Administered Medications:    lidocaine (PF) (XYLOCAINE) 1 % injection 10 mL, 10 mL, Other, Once, Milinda Pointer, MD  Allergies  Allergen Reactions   Penicillins     Did it involve swelling of the face/tongue/throat, SOB, or low BP? Unknown Did it involve sudden or severe rash/hives, skin peeling, or any reaction on the inside of your mouth or nose? Unknown Did you need to seek medical attention at a hospital or doctor's office? Unknown When did it last happen? Childhood allergy       If all above  answers are "NO", may proceed with cephalosporin use.     Objective:   There were no vitals taken for this visit. AAOx3, NAD NCAT, EOMI No obvious CN deficits Pt is able to speak clearly, coherently without shortness of breath or increased work of breathing.  Audible congestion and laryngitis Thought process is linear.  Mood is appropriate.   Assessment and Plan:   Viral URI- pt denies focal area of concern that would be suspicious for bacterial infection.  No sore throat, no ear pain, no sinus pain/pressure.  This is day 3 of nasal congestion and cough.  She reports some SOB w/ exertion but states 'I'm 80 and terribly out of shape'.  She doesn't feel that the SOB is specific to this illness.  Encouraged her to continue Coricidin.  Will add Tessalon.  Codeine cough syrup as needed.  Told her to increase fluids and get plenty of rest.  She is also to let us know if sxs aren't improving.  Pt expressed understanding and is in agreement w/ plan.    Annye Asa, MD 11/12/2021

## 2021-12-15 ENCOUNTER — Other Ambulatory Visit: Payer: Self-pay | Admitting: Family Medicine

## 2021-12-15 DIAGNOSIS — I10 Essential (primary) hypertension: Secondary | ICD-10-CM

## 2022-01-13 ENCOUNTER — Telehealth: Payer: Self-pay | Admitting: Family Medicine

## 2022-01-13 NOTE — Telephone Encounter (Signed)
° ° °  Ann Spencer Key: [id number] help? Call us at [phone] Outcome Additional Information Required The patient currently has access to the requested medication and a Prior Authorization is not needed for the patient/medication.

## 2022-01-13 NOTE — Telephone Encounter (Signed)
Prolia injection scheduled.

## 2022-01-17 DIAGNOSIS — Z1211 Encounter for screening for malignant neoplasm of colon: Secondary | ICD-10-CM | POA: Diagnosis not present

## 2022-01-17 LAB — COLOGUARD: Cologuard: NEGATIVE

## 2022-01-20 ENCOUNTER — Ambulatory Visit (INDEPENDENT_AMBULATORY_CARE_PROVIDER_SITE_OTHER): Payer: Medicare HMO

## 2022-01-20 VITALS — Ht 63.0 in | Wt 232.0 lb

## 2022-01-20 DIAGNOSIS — Z0000 Encounter for general adult medical examination without abnormal findings: Secondary | ICD-10-CM

## 2022-01-20 NOTE — Progress Notes (Signed)
Subjective:   Ann Spencer is a 81 y.o. female who presents for Medicare Annual (Subsequent) preventive examination.  Review of Systems    No ROS.  Medicare Wellness Virtual Visit.  Visual/audio telehealth visit, UTA vital signs.   See social history for additional risk factors.   Cardiac Risk Factors include: advanced age (>39men, >49 women)     Objective:    Today's Vitals   01/20/22 1118  Weight: 232 lb (105.2 kg)  Height: 5\' 3"  (1.6 m)   Body mass index is 41.1 kg/m.  Advanced Directives 01/20/2022 01/19/2021 01/17/2020 11/23/2019 11/22/2019 11/06/2019 11/05/2019  Does Patient Have a Medical Advance Directive? No No No No No - No  Would patient like information on creating a medical advance directive? No - Patient declined No - Patient declined No - Patient declined No - Patient declined No - Patient declined (No Data) Yes (Inpatient - patient requests chaplain consult to create a medical advance directive)    Current Medications (verified) Outpatient Encounter Medications as of 01/20/2022  Medication Sig   aspirin 81 MG tablet Take 81 mg by mouth daily.   benzonatate (TESSALON) 200 MG capsule Take 1 capsule (200 mg total) by mouth 3 (three) times daily as needed for cough. (Patient not taking: Reported on 01/20/2022)   Cholecalciferol 25 MCG (1000 UT) CHEW Chew 1,000 Units by mouth daily.    ferrous sulfate 325 (65 FE) MG EC tablet Take 325 mg by mouth daily with breakfast.   guaiFENesin-codeine (ROBITUSSIN AC) 100-10 MG/5ML syrup Take 10 mLs by mouth 3 (three) times daily as needed for cough. (Patient not taking: Reported on 01/20/2022)   hydrochlorothiazide (HYDRODIURIL) 25 MG tablet Take 1 tablet by mouth once daily   Misc Natural Products (OSTEO BI-FLEX JOINT SHIELD PO) Take 1 tablet by mouth daily.    Multiple Vitamin (MULTIVITAMIN) capsule Take 1 capsule by mouth daily.   omeprazole (PRILOSEC) 20 MG capsule Take 20 mg by mouth as needed. (Patient not taking: Reported on  01/20/2022)   Facility-Administered Encounter Medications as of 01/20/2022  Medication   lidocaine (PF) (XYLOCAINE) 1 % injection 10 mL    Allergies (verified) Penicillins   History: Past Medical History:  Diagnosis Date   Allergy    Anemia    Arthritis    Cellulitis    GERD (gastroesophageal reflux disease)    History of head injury 11/17/2015   Hypertension    PAD (peripheral artery disease) (Meta)    Phlebitis 2014   Pneumonia due to infectious organism 02/15/2018   Rotator cuff injury 12/2013   Past Surgical History:  Procedure Laterality Date   ABDOMINAL HYSTERECTOMY  1981   Full   COLONOSCOPY     SHOULDER SURGERY Left 03/03/15   TOTAL KNEE ARTHROPLASTY Left 11/05/2019   Procedure: TOTAL KNEE ARTHROPLASTY;  Surgeon: Thornton Park, MD;  Location: [facility] ORS;  Service: Orthopedics;  Laterality: Left;   vein closure Bilateral Feb 2008   Family History  Problem Relation Age of Onset   Varicose Veins Brother    Social History   Socioeconomic History   Marital status: Widowed    Spouse name: Not on file   Number of children: Not on file   Years of education: Not on file   Highest education level: Not on file  Occupational History   Not on file  Tobacco Use   Smoking status: Former    Packs/day: 1.00    Years: 30.00    Pack years: 30.00  Types: Cigarettes    Quit date: 12/12/1993    Years since quitting: 28.1   Smokeless tobacco: Never  Vaping Use   Vaping Use: Never used  Substance and Sexual Activity   Alcohol use: Yes    Alcohol/week: 7.0 standard drinks    Types: 7 Standard drinks or equivalent per week    Comment: Wine qhs   Drug use: No   Sexual activity: Never  Other Topics Concern   Not on file  Social History Narrative   [place]   Lives with daughter Beverlee Nims) and grandaughter    2 dogs lives inside   Enjoys reading   Social Determinants of Health   Financial Resource Strain: Low Risk    Difficulty of Paying Living Expenses: Not  hard at all  Food Insecurity: No Food Insecurity   Worried About Charity fundraiser in the Last Year: Never true   Arboriculturist in the Last Year: Never true  Transportation Needs: No Transportation Needs   Lack of Transportation (Medical): No   Lack of Transportation (Non-Medical): No  Physical Activity: Insufficiently Active   Days of Exercise per Week: 7 days   Minutes of Exercise per Session: 20 min  Stress: No Stress Concern Present   Feeling of Stress : Not at all  Social Connections: Unknown   Frequency of Communication with Friends and Family: More than three times a week   Frequency of Social Gatherings with Friends and Family: More than three times a week   Attends Religious Services: Not on file   Active Member of Clubs or Organizations: Not on file   Attends Archivist Meetings: Not on file   Marital Status: Widowed    Tobacco Counseling Counseling given: Not Answered   Clinical Intake:  Pre-visit preparation completed: Yes        Diabetes: No  How often do you need to have someone help you when you read instructions, pamphlets, or other written materials from your doctor or pharmacy?: 1 - Never   Interpreter Needed?: No      Activities of Daily Living In your present state of health, do you have any difficulty performing the following activities: 01/20/2022  Hearing? N  Vision? N  Difficulty concentrating or making decisions? N  Walking or climbing stairs? Y  Comment Paces self. Walker in use.  Dressing or bathing? N  Doing errands, shopping? N  Preparing Food and eating ? N  Using the Toilet? N  In the past six months, have you accidently leaked urine? N  Do you have problems with loss of bowel control? N  Managing your Medications? N  Managing your Finances? N  Housekeeping or managing your Housekeeping? N  Some recent data might be hidden    Patient Care Team: Leone Haven, MD as PCP - General (Family Medicine) Christene Lye, MD (General Surgery) Leward Quan, MD (Family Medicine) Rubbie Battiest, RN as Nurse Practitioner (Gerontology)  Indicate any recent Medical Services you may have received from other than [facility] providers in the past year (date may be approximate).     Assessment:   This is a routine wellness examination for Ann Spencer.  Virtual Visit via Telephone Note  I connected with  Ann Spencer on 01/20/22 at 11:15 AM EST by telephone and verified that I am speaking with the correct person using two identifiers.  Persons participating in the virtual visit: patient/Nurse Health Advisor   I discussed the limitations,  risks, security and privacy concerns of performing an evaluation and management service by telephone and the availability of in person appointments. The patient expressed understanding and agreed to proceed.  Interactive audio and video telecommunications were attempted between this nurse and patient, however failed, due to patient having technical difficulties OR patient did not have access to video capability.  We continued and completed visit with audio only.  Some vital signs may be absent or patient reported.   Hearing/Vision screen Hearing Screening - Comments:: Patient is able to hear conversational tones without difficulty. No issues reported. Vision Screening - Comments:: Wears corrective lenses  They have seen their ophthalmologist in the last 12 months.   Dietary issues and exercise activities discussed: Current Exercise Habits: Home exercise routine, Type of exercise: walking, Intensity: Mild Regular diet Good water intake   Goals Addressed               This Visit's Progress     Patient Stated     Weight (lb) < 222 lb 12.8 oz (101.1 kg) (pt-stated)   232 lb (105.2 kg)     Drink less wine Low cholesterol/low carb diet Walk laps in the home and neighborhood        Depression Screen [facility] 2/9 Scores 01/20/2022 04/23/2021 01/19/2021 09/11/2020 01/17/2020  11/27/2019 01/08/2019  PHQ - 2 Score 0 0 0 0 0 0 0  PHQ- 9 Score - - - - - - -    Fall Risk Fall Risk  01/20/2022 04/23/2021 01/19/2021 09/11/2020 02/17/2020  Falls in the past year? - 1 1 1 1   Comment - - - - -  Number falls in past yr: - 0 1 0 0  Injury with Fall? - 0 0 0 0  Risk Factor Category  - - - - -  Risk for fall due to : - - - - -  Risk for fall due to: Comment - - - - -  Follow up Falls evaluation completed Falls evaluation completed Falls evaluation completed Falls evaluation completed Falls evaluation completed   [place]: Home free of loose throw rugs in walkways, pet beds, electrical cords, etc? Yes  Adequate lighting in your home to reduce risk of falls? Yes   ASSISTIVE DEVICES UTILIZED TO PREVENT FALLS: Life alert? No  Cell phone on person? yes Use of a cane, walker or w/c? Yes   TIMED UP AND GO: Was the test performed? No .   Cognitive Function:  Patient is alert and oriented x3.   Enjoys Microbiologist, socializing with friends, walking. Manages her own medications and finances.    6CIT Screen 01/17/2020 01/08/2019  What Year? 0 points 0 points  What month? 0 points 0 points  What time? 0 points 0 points  Count back from 20 0 points 0 points  Months in reverse 0 points 0 points  Repeat phrase 0 points 0 points  Total Score 0 0   Immunizations Immunization History  Administered Date(s) Administered   Fluad Quad(high Dose 65+) 10/03/2019, 09/11/2020, 10/13/2021   Influenza, High Dose Seasonal PF 01/10/2017, 09/04/2017, 09/07/2018   Moderna Sars-Covid-2 Vaccination 04/05/2021   Pneumococcal Conjugate-13 02/20/2015   Tdap 12/21/2013   Pneumococcal vaccine status: Due, Education has been provided regarding the importance of this vaccine. Advised may receive this vaccine at local pharmacy or Health Dept. Aware to provide a copy of the vaccination record if obtained from local pharmacy or Health Dept. Verbalized acceptance and  understanding. Deferred  due to audio visit.   Shingrix Completed?: No.    Education has been provided regarding the importance of this vaccine. Patient has been advised to call insurance company to determine out of pocket expense if they have not yet received this vaccine. Advised may also receive vaccine at local pharmacy or Health Dept. Verbalized acceptance and understanding.  Screening Tests Health Maintenance  Topic Date Due   COVID-19 Vaccine (2 - Moderna series) 02/05/2022 (Originally 05/03/2021)   Zoster Vaccines- Shingrix (1 of 2) 04/19/2022 (Originally 09/06/1992)   Pneumonia Vaccine 26+ Years old (2 - PPSV23 if available, else PCV20) 01/20/2023 (Originally 02/20/2016)   TETANUS/TDAP  12/22/2023   INFLUENZA VACCINE  Completed   DEXA SCAN  Completed   HPV VACCINES  Aged Out   Hepatitis C Screening  Discontinued   Health Maintenance There are no preventive care reminders to display for this patient.  Cologuard- completed and mailed 01/18/22  Mammogram- completed 04/2021  Vision Screening: Recommended annual ophthalmology exams for early detection of glaucoma and other disorders of the eye.  Dental Screening: Recommended annual dental exams for proper oral hygiene  Community Resource Referral / Chronic Care Management: CRR required this visit?  No   CCM required this visit?  No      Plan:   Keep all routine maintenance appointments.   I have personally reviewed and noted the following in the patients chart:   Medical and social history Use of alcohol, tobacco or illicit drugs  Current medications and supplements including opioid prescriptions. Not taking opioid.  Functional ability and status Nutritional status Physical activity Advanced directives List of other physicians Hospitalizations, surgeries, and ER visits in previous 12 months Vitals Screenings to include cognitive, depression, and falls Referrals and appointments  In addition, I have reviewed and  discussed with patient certain preventive protocols, quality metrics, and best practice recommendations. A written personalized care plan for preventive services as well as general preventive health recommendations were provided to patient via mychart.     Varney Biles, LPN   02/15/6282

## 2022-01-20 NOTE — Patient Instructions (Addendum)
[name] , Thank you for taking time to come for your Medicare Wellness Visit. I appreciate your ongoing commitment to your health goals. Please review the following plan we discussed and let me know if I can assist you in the future.   These are the goals we discussed:  Goals       Patient Stated     Weight (lb) < 222 lb 12.8 oz (101.1 kg) (pt-stated)      Drink less wine Low cholesterol/low carb diet Walk laps in the home and neighborhood         This is a list of the screening recommended for you and due dates:  Health Maintenance  Topic Date Due   COVID-19 Vaccine (2 - Moderna series) 02/05/2022*   Zoster (Shingles) Vaccine (1 of 2) 04/19/2022*   Pneumonia Vaccine (2 - PPSV23 if available, else PCV20) 01/20/2023*   Tetanus Vaccine  12/22/2023   Flu Shot  Completed   DEXA scan (bone density measurement)  Completed   HPV Vaccine  Aged Out   Hepatitis C Screening: USPSTF Recommendation to screen - Ages 73-79 yo.  Discontinued  *Topic was postponed. The date shown is not the original due date.   Advanced directives: not yet completed  Conditions/risks identified: none new  Follow up in one year for your annual wellness visit    Preventive Care 65 Years and Older, Female Preventive care refers to lifestyle choices and visits with your health care provider that can promote health and wellness. What does preventive care include? A yearly physical exam. This is also called an annual well check. Dental exams once or twice a year. Routine eye exams. Ask your health care provider how often you should have your eyes checked. Personal lifestyle choices, including: Daily care of your teeth and gums. Regular physical activity. Eating a healthy diet. Avoiding tobacco and drug use. Limiting alcohol use. Practicing safe sex. Taking low-dose aspirin every day. Taking vitamin and mineral supplements as recommended by your health care provider. What happens during an annual  well check? The services and screenings done by your health care provider during your annual well check will depend on your age, overall health, lifestyle risk factors, and family history of disease. Counseling  Your health care provider may ask you questions about your: Alcohol use. Tobacco use. Drug use. Emotional well-being. Home and relationship well-being. Sexual activity. Eating habits. History of falls. Memory and ability to understand (cognition). Work and work Statistician. Reproductive health. Screening  You may have the following tests or measurements: Height, weight, and BMI. Blood pressure. Lipid and cholesterol levels. These may be checked every 5 years, or more frequently if you are over 80 years old. Skin check. Lung cancer screening. You may have this screening every year starting at age 100 if you have a 30-pack-year history of smoking and currently smoke or have quit within the past 15 years. Fecal occult blood test (FOBT) of the stool. You may have this test every year starting at age 20. Flexible sigmoidoscopy or colonoscopy. You may have a sigmoidoscopy every 5 years or a colonoscopy every 10 years starting at age 45. Hepatitis C blood test. Hepatitis B blood test. Sexually transmitted disease (STD) testing. Diabetes screening. This is done by checking your blood sugar (glucose) after you have not eaten for a while (fasting). You may have this done every 1-3 years. Bone density scan. This is done to screen for osteoporosis. You may have this done starting at age 46.  Mammogram. This may be done every 1-2 years. Talk to your health care provider about how often you should have regular mammograms. Talk with your health care provider about your test results, treatment options, and if necessary, the need for more tests. Vaccines  Your health care provider may recommend certain vaccines, such as: Influenza vaccine. This is recommended every year. Tetanus, diphtheria,  and acellular pertussis (Tdap, Td) vaccine. You may need a Td booster every 10 years. Zoster vaccine. You may need this after age 40. Pneumococcal 13-valent conjugate (PCV13) vaccine. One dose is recommended after age 52. Pneumococcal polysaccharide (PPSV23) vaccine. One dose is recommended after age 63. Talk to your health care provider about which screenings and vaccines you need and how often you need them. This information is not intended to replace advice given to you by your health care provider. Make sure you discuss any questions you have with your health care provider. Document Released: 12/25/2015 Document Revised: 08/17/2016 Document Reviewed: 09/29/2015 Elsevier Interactive Patient Education  2017 [place] Prevention in the Home Falls can cause injuries. They can happen to people of all ages. There are many things you can do to make your home safe and to help prevent falls. What can I do on the outside of my home? Regularly fix the edges of walkways and driveways and fix any cracks. Remove anything that might make you trip as you walk through a door, such as a raised step or threshold. Trim any bushes or trees on the path to your home. Use bright outdoor lighting. Clear any walking paths of anything that might make someone trip, such as rocks or tools. Regularly check to see if handrails are loose or broken. Make sure that both sides of any steps have handrails. Any raised decks and porches should have guardrails on the edges. Have any leaves, snow, or ice cleared regularly. Use sand or salt on walking paths during winter. Clean up any spills in your garage right away. This includes oil or grease spills. What can I do in the bathroom? Use night lights. Install grab bars by the toilet and in the tub and shower. Do not use towel bars as grab bars. Use non-skid mats or decals in the tub or shower. If you need to sit down in the shower, use a plastic, non-slip  stool. Keep the floor dry. Clean up any water that spills on the floor as soon as it happens. Remove soap buildup in the tub or shower regularly. Attach bath mats securely with double-sided non-slip rug tape. Do not have throw rugs and other things on the floor that can make you trip. What can I do in the bedroom? Use night lights. Make sure that you have a light by your bed that is easy to reach. Do not use any sheets or blankets that are too big for your bed. They should not hang down onto the floor. Have a firm chair that has side arms. You can use this for support while you get dressed. Do not have throw rugs and other things on the floor that can make you trip. What can I do in the kitchen? Clean up any spills right away. Avoid walking on wet floors. Keep items that you use a lot in easy-to-reach places. If you need to reach something above you, use a strong step stool that has a grab bar. Keep electrical cords out of the way. Do not use floor polish or wax that makes floors slippery. If  you must use wax, use non-skid floor wax. Do not have throw rugs and other things on the floor that can make you trip. What can I do with my stairs? Do not leave any items on the stairs. Make sure that there are handrails on both sides of the stairs and use them. Fix handrails that are broken or loose. Make sure that handrails are as long as the stairways. Check any carpeting to make sure that it is firmly attached to the stairs. Fix any carpet that is loose or worn. Avoid having throw rugs at the top or bottom of the stairs. If you do have throw rugs, attach them to the floor with carpet tape. Make sure that you have a light switch at the top of the stairs and the bottom of the stairs. If you do not have them, ask someone to add them for you. What else can I do to help prevent falls? Wear shoes that: Do not have high heels. Have rubber bottoms. Are comfortable and fit you well. Are closed at the  toe. Do not wear sandals. If you use a stepladder: Make sure that it is fully opened. Do not climb a closed stepladder. Make sure that both sides of the stepladder are locked into place. Ask someone to hold it for you, if possible. Clearly mark and make sure that you can see: Any grab bars or handrails. First and last steps. Where the edge of each step is. Use tools that help you move around (mobility aids) if they are needed. These include: Canes. Walkers. Scooters. Crutches. Turn on the lights when you go into a dark area. Replace any light bulbs as soon as they burn out. Set up your furniture so you have a clear path. Avoid moving your furniture around. If any of your floors are uneven, fix them. If there are any pets around you, be aware of where they are. Review your medicines with your doctor. Some medicines can make you feel dizzy. This can increase your chance of falling. Ask your doctor what other things that you can do to help prevent falls. This information is not intended to replace advice given to you by your health care provider. Make sure you discuss any questions you have with your health care provider. Document Released: 09/24/2009 Document Revised: 05/05/2016 Document Reviewed: 01/02/2015 Elsevier Interactive Patient Education  2017 [employer].

## 2022-01-24 ENCOUNTER — Telehealth: Payer: Self-pay | Admitting: Family Medicine

## 2022-01-24 ENCOUNTER — Ambulatory Visit (INDEPENDENT_AMBULATORY_CARE_PROVIDER_SITE_OTHER): Payer: Medicare HMO | Admitting: Family Medicine

## 2022-01-24 ENCOUNTER — Other Ambulatory Visit: Payer: Self-pay

## 2022-01-24 ENCOUNTER — Encounter: Payer: Self-pay | Admitting: Family Medicine

## 2022-01-24 DIAGNOSIS — M810 Age-related osteoporosis without current pathological fracture: Secondary | ICD-10-CM

## 2022-01-24 DIAGNOSIS — I10 Essential (primary) hypertension: Secondary | ICD-10-CM

## 2022-01-24 DIAGNOSIS — K219 Gastro-esophageal reflux disease without esophagitis: Secondary | ICD-10-CM

## 2022-01-24 DIAGNOSIS — E785 Hyperlipidemia, unspecified: Secondary | ICD-10-CM

## 2022-01-24 DIAGNOSIS — I890 Lymphedema, not elsewhere classified: Secondary | ICD-10-CM | POA: Diagnosis not present

## 2022-01-24 DIAGNOSIS — Z8639 Personal history of other endocrine, nutritional and metabolic disease: Secondary | ICD-10-CM

## 2022-01-24 LAB — COLOGUARD: Cologuard: NEGATIVE

## 2022-01-24 LAB — VITAMIN D 25 HYDROXY (VIT D DEFICIENCY, FRACTURES): VITD: 107.93 ng/mL (ref 30.00–100.00)

## 2022-01-24 NOTE — Patient Instructions (Signed)
Nice to see you. We will contact you with your lab results. 

## 2022-01-24 NOTE — Progress Notes (Signed)
Tommi Rumps, MD Phone: [phone]  Ann Spencer is a 81 y.o. female who presents today for f/u.  HYPERTENSION Disease Monitoring Home BP Monitoring 110-120/70-80 Chest pain- no    Dyspnea- no Medications Compliance-  taking HCTZ. Lightheadedness-  no  Edema- no change to lymphedema (wears compression stocking and walks for this) BMET    Component Value Date/Time   NA 138 04/23/2021 1415   NA 138 02/14/2014 1329   K 4.3 04/23/2021 1415   K 4.2 02/14/2014 1329   CL 97 04/23/2021 1415   CL 104 02/14/2014 1329   CO2 31 04/23/2021 1415   CO2 26 02/14/2014 1329   GLUCOSE 100 (H) 04/23/2021 1415   GLUCOSE 105 (H) 02/14/2014 1329   BUN 17 04/23/2021 1415   BUN 13 02/14/2014 1329   CREATININE 0.81 04/23/2021 1415   CREATININE 0.85 09/11/2020 1436   CALCIUM 9.5 04/23/2021 1415   CALCIUM 9.4 02/14/2014 1329   GFRNONAA >60 11/25/2019 0500   GFRNONAA 56 (L) 03/05/2015 0145   GFRAA >60 11/25/2019 0500   GFRAA >60 03/05/2015 0145   GERD: asymptomatic. Has only take omeprazole twice in 6 months related to eating foods that trigger reflux.  Osteoporosis: No fractures.  She is scheduled for Prolia next week.  She does take calcium and vitamin D.  History of hyperlipidemia: Patient tries to eat healthy.  This is somewhat limited by her family members.  No history of stroke or heart attack.  Social History   Tobacco Use  Smoking Status Former   Packs/day: 1.00   Years: 30.00   Pack years: 30.00   Types: Cigarettes   Quit date: 12/12/1993   Years since quitting: 28.1  Smokeless Tobacco Never    Current Outpatient Medications on File Prior to Visit  Medication Sig Dispense Refill   aspirin 81 MG tablet Take 81 mg by mouth daily.     benzonatate (TESSALON) 200 MG capsule Take 1 capsule (200 mg total) by mouth 3 (three) times daily as needed for cough. 21 capsule 0   Cholecalciferol 25 MCG (1000 UT) CHEW Chew 1,000 Units by mouth daily.      ferrous sulfate 325 (65 FE) MG  EC tablet Take 325 mg by mouth daily with breakfast.     guaiFENesin-codeine (ROBITUSSIN AC) 100-10 MG/5ML syrup Take 10 mLs by mouth 3 (three) times daily as needed for cough. 120 mL 0   hydrochlorothiazide (HYDRODIURIL) 25 MG tablet Take 1 tablet by mouth once daily 90 tablet 0   Misc Natural Products (OSTEO BI-FLEX JOINT SHIELD PO) Take 1 tablet by mouth daily.      Multiple Vitamin (MULTIVITAMIN) capsule Take 1 capsule by mouth daily.     omeprazole (PRILOSEC) 20 MG capsule Take 20 mg by mouth as needed.     Current Facility-Administered Medications on File Prior to Visit  Medication Dose Route Frequency Provider Last Rate Last Admin   lidocaine (PF) (XYLOCAINE) 1 % injection 10 mL  10 mL Other Once Milinda Pointer, MD         ROS see history of present illness  Objective  Physical Exam Vitals:   01/24/22 1137  BP: 110/80  Pulse: 81  Temp: 97.8 F (36.6 C)  SpO2: 94%    BP Readings from Last 3 Encounters:  01/24/22 110/80  04/23/21 130/60  01/19/21 127/81   Wt Readings from Last 3 Encounters:  01/24/22 234 lb (106.1 kg)  01/20/22 232 lb (105.2 kg)  04/23/21 234 lb (106.1 kg)  Physical Exam Constitutional:      General: She is not in acute distress.    Appearance: She is not diaphoretic.  Cardiovascular:     Rate and Rhythm: Normal rate and regular rhythm.     Heart sounds: Murmur (2/6 systolic murmur right upper sternal border) heard.  Pulmonary:     Effort: Pulmonary effort is normal.     Breath sounds: Normal breath sounds.  Skin:    General: Skin is warm and dry.  Neurological:     Mental Status: She is alert.     Assessment/Plan: Please see individual problem list.  Problem List Items Addressed This Visit     Essential hypertension    Well-controlled.  She will continue hydrochlorothiazide 25 mg daily.  Check labs.      Relevant Orders   Comp Met (CMET)   GERD (gastroesophageal reflux disease)    Very rarely has symptoms depending on what  she eats.  She can continue omeprazole as needed.      Hyperlipidemia    Discussed given the patient's age and her lack of history of stroke and heart attack statin would not be indicated at this time.  She will continue to monitor her diet.      Lymphedema    Chronic issue.  Generally stable.  She will continue to wear her compression stockings and try to walk.      Osteoporosis    She will continue Prolia.  She will continue vitamin D and calcium.  I encouraged her to get calcium from her diet.      Relevant Orders   Vitamin D (25 hydroxy)    Return in about 6 months (around 07/24/2022) for htn.  This visit occurred during the SARS-CoV-2 public health emergency.  Safety protocols were in place, including screening questions prior to the visit, additional usage of staff PPE, and extensive cleaning of exam room while observing appropriate contact time as indicated for disinfecting solutions.    Tommi Rumps, MD [place]

## 2022-01-24 NOTE — Assessment & Plan Note (Signed)
Chronic issue.  Generally stable.  She will continue to wear her compression stockings and try to walk.

## 2022-01-24 NOTE — Assessment & Plan Note (Signed)
Very rarely has symptoms depending on what she eats.  She can continue omeprazole as needed.

## 2022-01-24 NOTE — Assessment & Plan Note (Signed)
Well-controlled.  She will continue hydrochlorothiazide 25 mg daily.  Check labs.

## 2022-01-24 NOTE — Telephone Encounter (Signed)
Ann Spencer from [facility] states that pt vitamin D is 108. Ann Spencer stated cma want be able to see it until tomorrow because the chemistry side is down

## 2022-01-24 NOTE — Assessment & Plan Note (Signed)
She will continue Prolia.  She will continue vitamin D and calcium.  I encouraged her to get calcium from her diet.

## 2022-01-24 NOTE — Assessment & Plan Note (Signed)
Discussed given the patient's age and her lack of history of stroke and heart attack statin would not be indicated at this time.  She will continue to monitor her diet.

## 2022-01-25 LAB — COMPREHENSIVE METABOLIC PANEL
ALT: 31 U/L (ref 0–35)
AST: 21 U/L (ref 0–37)
Albumin: 4.2 g/dL (ref 3.5–5.2)
Alkaline Phosphatase: 66 U/L (ref 39–117)
BUN: 18 mg/dL (ref 6–23)
CO2: 32 mEq/L (ref 19–32)
Calcium: 10 mg/dL (ref 8.4–10.5)
Chloride: 100 mEq/L (ref 96–112)
Creatinine, Ser: 0.81 mg/dL (ref 0.40–1.20)
GFR: 69.06 mL/min (ref >60.00)
Glucose, Bld: 93 mg/dL (ref 70–99)
Potassium: 4.2 mEq/L (ref 3.5–5.1)
Sodium: 139 mEq/L (ref 135–145)
Total Bilirubin: 0.4 mg/dL (ref 0.2–1.2)
Total Protein: 7.4 g/dL (ref 6.0–8.3)

## 2022-01-25 NOTE — Telephone Encounter (Signed)
I called and spoke with the patient and informed her that her Vitamin D level is elevated and to stop the Vitamin D supplement and she understood, she is scheduled for a recheck of Vitamin d in 6 weeks.  Peighton Edgin,cma

## 2022-01-25 NOTE — Telephone Encounter (Signed)
Noted. Please call the patient an let her know that her vitamin D is elevated. She can stop her vitamin D supplement and have this rechecked in 6 weeks.

## 2022-02-02 ENCOUNTER — Other Ambulatory Visit: Payer: Self-pay

## 2022-02-02 ENCOUNTER — Ambulatory Visit (INDEPENDENT_AMBULATORY_CARE_PROVIDER_SITE_OTHER): Payer: Medicare HMO

## 2022-02-02 DIAGNOSIS — M810 Age-related osteoporosis without current pathological fracture: Secondary | ICD-10-CM | POA: Diagnosis not present

## 2022-02-02 MED ORDER — DENOSUMAB 60 MG/ML ~~LOC~~ SOSY
60.0000 mg | PREFILLED_SYRINGE | Freq: Once | SUBCUTANEOUS | Status: AC
  Administered 2022-02-02: 60 mg via SUBCUTANEOUS

## 2022-02-02 NOTE — Progress Notes (Signed)
Patient came in today for Prolia injection given in right arm SQ.Patient tolerated well with no signs of distress.  

## 2022-02-25 ENCOUNTER — Other Ambulatory Visit: Payer: Self-pay

## 2022-02-25 ENCOUNTER — Encounter: Payer: Self-pay | Admitting: Family Medicine

## 2022-02-25 ENCOUNTER — Ambulatory Visit (INDEPENDENT_AMBULATORY_CARE_PROVIDER_SITE_OTHER): Payer: Medicare HMO | Admitting: Family Medicine

## 2022-02-25 DIAGNOSIS — Z6841 Body Mass Index (BMI) 40.0 and over, adult: Secondary | ICD-10-CM | POA: Diagnosis not present

## 2022-02-25 DIAGNOSIS — R0609 Other forms of dyspnea: Secondary | ICD-10-CM

## 2022-02-25 DIAGNOSIS — I10 Essential (primary) hypertension: Secondary | ICD-10-CM | POA: Diagnosis not present

## 2022-02-25 DIAGNOSIS — J309 Allergic rhinitis, unspecified: Secondary | ICD-10-CM | POA: Diagnosis not present

## 2022-02-25 DIAGNOSIS — R0600 Dyspnea, unspecified: Secondary | ICD-10-CM | POA: Insufficient documentation

## 2022-02-25 DIAGNOSIS — M25511 Pain in right shoulder: Secondary | ICD-10-CM | POA: Diagnosis not present

## 2022-02-25 DIAGNOSIS — M7541 Impingement syndrome of right shoulder: Secondary | ICD-10-CM | POA: Diagnosis not present

## 2022-02-25 DIAGNOSIS — R0789 Other chest pain: Secondary | ICD-10-CM | POA: Diagnosis not present

## 2022-02-25 DIAGNOSIS — G8929 Other chronic pain: Secondary | ICD-10-CM

## 2022-02-25 MED ORDER — FLUTICASONE PROPIONATE 50 MCG/ACT NA SUSP: 2.0000 | Freq: Every day | NASAL | 6 refills | Status: DC

## 2022-02-25 NOTE — Progress Notes (Signed)
?Tommi Rumps, MD ?Phone: [phone] ? ?Ann Spencer is a 81 y.o. female who presents today for f/u. ? ?HYPERTENSION ?Disease Monitoring ?Chest pain- rare twinges lasting 30 seconds, no pressure or tightness (this is an ongoing issue previously evaluated with EKG and felt to be MSK or stress related), this is not exertional Dyspnea- chronic mild DOE ?Medications ?Compliance-  taking HCTZ.  Edema- chronic and stable ?BMET ?   ?Component Value Date/Time  ? NA 139 01/24/2022 1157  ? NA 138 02/14/2014 1329  ? K 4.2 01/24/2022 1157  ? K 4.2 02/14/2014 1329  ? CL 100 01/24/2022 1157  ? CL 104 02/14/2014 1329  ? CO2 32 01/24/2022 1157  ? CO2 26 02/14/2014 1329  ? GLUCOSE 93 01/24/2022 1157  ? GLUCOSE 105 (H) 02/14/2014 1329  ? BUN 18 01/24/2022 1157  ? BUN 13 02/14/2014 1329  ? CREATININE 0.81 01/24/2022 1157  ? CREATININE 0.85 09/11/2020 1436  ? CALCIUM 10.0 01/24/2022 1157  ? CALCIUM 9.4 02/14/2014 1329  ? GFRNONAA >60 11/25/2019 0500  ? GFRNONAA 56 (L) 03/05/2015 0145  ? GFRAA >60 11/25/2019 0500  ? GFRAA >60 03/05/2015 0145  ? ?Obesity: Patient does not exercise given her orthopedic issues.  She does not eat anything for breakfast.  She will have a sandwich and yogurt or biscuits and gravy for lunch.  Pasta and meatballs for dinner.  Typically has a salad several times a week.  Not many snacks. ? ?Cough and congestion: This has been an ongoing issue for about 2 weeks.  She has watery eyes.  She notes nasal congestion though no sinus congestion.  She had some headache and sore throat to start with.  No fevers.  She does sneeze with this. ? ?Chronic right shoulder pain: She follows with orthopedics for this.  Her last injection was not beneficial. ? ?Social History  ? ?Tobacco Use  ?Smoking Status Former  ? Packs/day: 1.00  ? Years: 30.00  ? Pack years: 30.00  ? Types: Cigarettes  ? Quit date: 12/12/1993  ? Years since quitting: 28.2  ?Smokeless Tobacco Never  ? ? ?Current Outpatient Medications on File Prior to  Visit  ?Medication Sig Dispense Refill  ? aspirin 81 MG tablet Take 81 mg by mouth daily.    ? benzonatate (TESSALON) 200 MG capsule Take 1 capsule (200 mg total) by mouth 3 (three) times daily as needed for cough. 21 capsule 0  ? Cholecalciferol 25 MCG (1000 UT) CHEW Chew 1,000 Units by mouth daily.     ? ferrous sulfate 325 (65 FE) MG EC tablet Take 325 mg by mouth daily with breakfast.    ? guaiFENesin-codeine (ROBITUSSIN AC) 100-10 MG/5ML syrup Take 10 mLs by mouth 3 (three) times daily as needed for cough. 120 mL 0  ? hydrochlorothiazide (HYDRODIURIL) 25 MG tablet Take 1 tablet by mouth once daily 90 tablet 0  ? Misc Natural Products (OSTEO BI-FLEX JOINT SHIELD PO) Take 1 tablet by mouth daily.     ? Multiple Vitamin (MULTIVITAMIN) capsule Take 1 capsule by mouth daily.    ? omeprazole (PRILOSEC) 20 MG capsule Take 20 mg by mouth as needed.    ? ?Current Facility-Administered Medications on File Prior to Visit  ?Medication Dose Route Frequency Provider Last Rate Last Admin  ? lidocaine (PF) (XYLOCAINE) 1 % injection 10 mL  10 mL Other Once Milinda Pointer, MD      ? ? ? ?ROS see history of present illness ? ?Objective ? ?Physical Exam ?  Vitals:  ? 02/25/22 1358  ?BP: 120/70  ?Pulse: 92  ?Temp: 97.8 ?F (36.6 ?C)  ?SpO2: 95%  ? ? ?BP Readings from Last 3 Encounters:  ?02/25/22 120/70  ?01/24/22 110/80  ?04/23/21 130/60  ? ?Wt Readings from Last 3 Encounters:  ?02/25/22 236 lb 12.8 oz (107.4 kg)  ?01/24/22 234 lb (106.1 kg)  ?01/20/22 232 lb (105.2 kg)  ? ? ?Physical Exam ?Constitutional:   ?   General: She is not in acute distress. ?   Appearance: She is not diaphoretic.  ?Cardiovascular:  ?   Rate and Rhythm: Normal rate and regular rhythm.  ?   Heart sounds: Murmur (1/6 systolic murmur left upper sternal border) heard.  ?Pulmonary:  ?   Effort: Pulmonary effort is normal.  ?   Breath sounds: Normal breath sounds.  ?Skin: ?   General: Skin is warm and dry.  ?Neurological:  ?   Mental Status: She is alert.   ? ? ? ?Assessment/Plan: Please see individual problem list. ? ?Problem List Items Addressed This Visit   ? ? Allergic rhinitis (Chronic)  ?  I suspect her respiratory symptoms are likely allergy related or viral related.  She has been improving and thus antibiotics are not indicated.  She will start on Claritin and we will start her on Flonase.  She was advised to discontinue Flonase if she developed nosebleeds with it. ?  ?  ? Essential hypertension (Chronic)  ?  Well-controlled.  He will continue HCTZ 25 mg once daily. ?  ?  ? Obesity (Chronic)  ?  Encouraged healthy diet.  She will remain as active as she can. ?  ?  ? Shoulder pain (Chronic)  ?  She will contact orthopedics for follow-up.  She needs to discuss potential surgical options. ?  ?  ? Chest discomfort  ?  Continues to have atypical chest discomfort.  I suspect this is musculoskeletal related.  Prior EKG was reassuring.  If she develops any changing symptoms or worsening symptoms she will let us know. ?  ?  ? Dyspnea  ?  Patient reports chronic dyspnea on exertion.  Certainly this could be related to deconditioning.  She does have a heart murmur so we will check an echo to evaluate for a cause.  We will check a CBC with her upcoming labs to determine if she is anemic. ?  ?  ? Relevant Orders  ? CBC  ? ECHOCARDIOGRAM COMPLETE  ? ? ?Return in about 3 months (around 05/28/2022) for Follow-up after echo. ? ?This visit occurred during the SARS-CoV-2 public health emergency.  Safety protocols were in place, including screening questions prior to the visit, additional usage of staff PPE, and extensive cleaning of exam room while observing appropriate contact time as indicated for disinfecting solutions.  ? ? ?Tommi Rumps, MD ?[facility] ? ?

## 2022-02-25 NOTE — Patient Instructions (Signed)
Nice to see you. ?Somebody will contact you regarding the echo. ?If you develop chest pressure or tightness or change to the twinges that she get or if you develop worsening breathing issues you need to seek medical attention immediately. ?Please try the Claritin and Flonase for your upper respiratory symptoms. ?

## 2022-02-25 NOTE — Assessment & Plan Note (Signed)
Continues to have atypical chest discomfort.  I suspect this is musculoskeletal related.  Prior EKG was reassuring.  If she develops any changing symptoms or worsening symptoms she will let us know. ?

## 2022-02-25 NOTE — Assessment & Plan Note (Signed)
She will contact orthopedics for follow-up.  She needs to discuss potential surgical options. ?

## 2022-02-25 NOTE — Assessment & Plan Note (Signed)
Encouraged healthy diet.  She will remain as active as she can. ?

## 2022-02-25 NOTE — Assessment & Plan Note (Signed)
Patient reports chronic dyspnea on exertion.  Certainly this could be related to deconditioning.  She does have a heart murmur so we will check an echo to evaluate for a cause.  We will check a CBC with her upcoming labs to determine if she is anemic. ?

## 2022-02-25 NOTE — Assessment & Plan Note (Addendum)
I suspect her respiratory symptoms are likely allergy related or viral related.  She has been improving and thus antibiotics are not indicated.  She will start on Claritin and we will start her on Flonase.  She was advised to discontinue Flonase if she developed nosebleeds with it. ?

## 2022-02-25 NOTE — Assessment & Plan Note (Signed)
Well-controlled.  He will continue HCTZ 25 mg once daily. ?

## 2022-03-08 ENCOUNTER — Other Ambulatory Visit: Payer: Self-pay

## 2022-03-08 ENCOUNTER — Other Ambulatory Visit (INDEPENDENT_AMBULATORY_CARE_PROVIDER_SITE_OTHER): Payer: Medicare HMO

## 2022-03-08 DIAGNOSIS — Z8639 Personal history of other endocrine, nutritional and metabolic disease: Secondary | ICD-10-CM | POA: Diagnosis not present

## 2022-03-08 DIAGNOSIS — R0609 Other forms of dyspnea: Secondary | ICD-10-CM

## 2022-03-08 LAB — CBC
HCT: 45 % (ref 36.0–46.0)
Hemoglobin: 15.3 g/dL — ABNORMAL HIGH (ref 12.0–15.0)
MCHC: 33.9 g/dL (ref 30.0–36.0)
MCV: 97.6 fl (ref 78.0–100.0)
Platelets: 181 10*3/uL (ref 150.0–400.0)
RBC: 4.61 Mil/uL (ref 3.87–5.11)
RDW: 12.8 % (ref 11.5–15.5)
WBC: 4.8 10*3/uL (ref 4.0–10.5)

## 2022-03-08 LAB — VITAMIN D 25 HYDROXY (VIT D DEFICIENCY, FRACTURES): VITD: 94.63 ng/mL (ref 30.00–100.00)

## 2022-03-30 ENCOUNTER — Other Ambulatory Visit: Payer: Self-pay | Admitting: Family Medicine

## 2022-03-30 DIAGNOSIS — I10 Essential (primary) hypertension: Secondary | ICD-10-CM

## 2022-06-16 ENCOUNTER — Telehealth: Payer: Self-pay | Admitting: *Deleted

## 2022-06-16 NOTE — Telephone Encounter (Signed)
Amount Due: $301   Prolia Auth Approved: [employer] Auth# [id number] Effective: 06/16/22-06/17/23  Anola Gurney message on 06/16/22 to notify patient.

## 2022-06-17 ENCOUNTER — Encounter: Payer: Self-pay | Admitting: *Deleted

## 2022-07-26 ENCOUNTER — Ambulatory Visit: Payer: Medicare HMO | Admitting: Family Medicine

## 2022-09-12 NOTE — Telephone Encounter (Signed)
Pt called in stating she can not afford the prolia shot and would not like it done anymore

## 2022-09-13 NOTE — Telephone Encounter (Signed)
FYI: Pt has OV on 10/6. I made a note on the appt schedule to discuss alternative for Prolia due to cost.

## 2022-09-13 NOTE — Telephone Encounter (Signed)
Noted  

## 2022-09-16 ENCOUNTER — Encounter: Payer: Self-pay | Admitting: Family Medicine

## 2022-09-16 ENCOUNTER — Ambulatory Visit (INDEPENDENT_AMBULATORY_CARE_PROVIDER_SITE_OTHER): Payer: Medicare HMO | Admitting: Family Medicine

## 2022-09-16 VITALS — BP 130/70 | HR 88 | Temp 98.3°F | Ht 63.0 in | Wt 238.2 lb

## 2022-09-16 DIAGNOSIS — M810 Age-related osteoporosis without current pathological fracture: Secondary | ICD-10-CM

## 2022-09-16 DIAGNOSIS — I10 Essential (primary) hypertension: Secondary | ICD-10-CM

## 2022-09-16 DIAGNOSIS — D582 Other hemoglobinopathies: Secondary | ICD-10-CM | POA: Diagnosis not present

## 2022-09-16 DIAGNOSIS — M79642 Pain in left hand: Secondary | ICD-10-CM | POA: Diagnosis not present

## 2022-09-16 DIAGNOSIS — Z23 Encounter for immunization: Secondary | ICD-10-CM | POA: Diagnosis not present

## 2022-09-16 LAB — CBC
HCT: 43 % (ref 35.0–45.0)
Hemoglobin: 14.5 g/dL (ref 11.7–15.5)
MCH: 32.6 pg (ref 27.0–33.0)
MCHC: 33.7 g/dL (ref 32.0–36.0)
MCV: 96.6 fL (ref 80.0–100.0)
MPV: 10.4 fL (ref 7.5–12.5)
Platelets: 183 10*3/uL (ref 140–400)
RBC: 4.45 10*6/uL (ref 3.80–5.10)
RDW: 11.9 % (ref 11.0–15.0)
WBC: 4.2 10*3/uL (ref 3.8–10.8)

## 2022-09-16 NOTE — Progress Notes (Signed)
Ann Rumps, MD Phone: [phone]  Ann Spencer is a 81 y.o. female who presents today for follow-up.  HYPERTENSION Disease Monitoring Home BP Monitoring 125-135/70 Chest pain- no    Dyspnea- no Medications Compliance-  taking HCTZ.   Edema- no BMET    Component Value Date/Time   NA 139 01/24/2022 1157   NA 138 02/14/2014 1329   K 4.2 01/24/2022 1157   K 4.2 02/14/2014 1329   CL 100 01/24/2022 1157   CL 104 02/14/2014 1329   CO2 32 01/24/2022 1157   CO2 26 02/14/2014 1329   GLUCOSE 93 01/24/2022 1157   GLUCOSE 105 (H) 02/14/2014 1329   BUN 18 01/24/2022 1157   BUN 13 02/14/2014 1329   CREATININE 0.81 01/24/2022 1157   CREATININE 0.85 09/11/2020 1436   CALCIUM 10.0 01/24/2022 1157   CALCIUM 9.4 02/14/2014 1329   GFRNONAA >60 11/25/2019 0500   GFRNONAA 56 (L) 03/05/2015 0145   GFRAA >60 11/25/2019 0500   GFRAA >60 03/05/2015 0145   Osteoporosis: Prolia is too expensive.  She does report a history of stomach or esophagus ulcer.  She notes no recent issues with this.  She notes no reflux.  She is no longer on medications for reflux.  Left hand pain: This has been going on a couple of weeks.  Notes it is tender and painful at the base of her left thumb.  She notes it has been swollen.  She notes no injury.  Social History   Tobacco Use  Smoking Status Former   Packs/day: 1.00   Years: 30.00   Total pack years: 30.00   Types: Cigarettes   Quit date: 12/12/1993   Years since quitting: 28.7  Smokeless Tobacco Never    Current Outpatient Medications on File Prior to Visit  Medication Sig Dispense Refill   aspirin 81 MG tablet Take 81 mg by mouth daily.     benzonatate (TESSALON) 200 MG capsule Take 1 capsule (200 mg total) by mouth 3 (three) times daily as needed for cough. 21 capsule 0   Cholecalciferol 25 MCG (1000 UT) CHEW Chew 1,000 Units by mouth daily.      ferrous sulfate 325 (65 FE) MG EC tablet Take 325 mg by mouth daily with breakfast.      fluticasone (FLONASE) 50 MCG/ACT nasal spray Place 2 sprays into both nostrils daily. 16 g 6   guaiFENesin-codeine (ROBITUSSIN AC) 100-10 MG/5ML syrup Take 10 mLs by mouth 3 (three) times daily as needed for cough. 120 mL 0   hydrochlorothiazide (HYDRODIURIL) 25 MG tablet Take 1 tablet by mouth once daily 90 tablet 1   Misc Natural Products (OSTEO BI-FLEX JOINT SHIELD PO) Take 1 tablet by mouth daily.      Multiple Vitamin (MULTIVITAMIN) capsule Take 1 capsule by mouth daily.     omeprazole (PRILOSEC) 20 MG capsule Take 20 mg by mouth as needed.     Current Facility-Administered Medications on File Prior to Visit  Medication Dose Route Frequency Provider Last Rate Last Admin   lidocaine (PF) (XYLOCAINE) 1 % injection 10 mL  10 mL Other Once Milinda Pointer, MD         ROS see history of present illness  Objective  Physical Exam Vitals:   09/16/22 1357  BP: 130/70  Pulse: 88  Temp: 98.3 F (36.8 C)  SpO2: 94%    BP Readings from Last 3 Encounters:  09/16/22 130/70  02/25/22 120/70  01/24/22 110/80   Wt Readings from Last 3  Encounters:  09/16/22 238 lb 3.2 oz (108 kg)  02/25/22 236 lb 12.8 oz (107.4 kg)  01/24/22 234 lb (106.1 kg)    Physical Exam Constitutional:      General: She is not in acute distress.    Appearance: She is not diaphoretic.  Cardiovascular:     Rate and Rhythm: Normal rate and regular rhythm.     Heart sounds: Normal heart sounds.  Pulmonary:     Effort: Pulmonary effort is normal.     Breath sounds: Normal breath sounds.  Musculoskeletal:       Arms:  Skin:    General: Skin is warm and dry.  Neurological:     Mental Status: She is alert.      Assessment/Plan: Please see individual problem list.  Problem List Items Addressed This Visit     Essential hypertension (Chronic)    Well-controlled.  She will continue HCTZ 25 mg daily.      Osteoporosis (Chronic)    Prolia was not affordable.  We will check and see whether or not  Fosamax is an option with her GI history.      Elevated hemoglobin (HCC) - Primary    Recheck today.      Relevant Orders   CBC   Left hand pain    Possibly related to osteoarthritis.  She is hesitant to use any NSAIDs.  She will ice the area for 10 minutes 3 times a day.  She will contact her orthopedist to consider an injection if appropriate.      Other Visit Diagnoses     Need for immunization against influenza       Relevant Orders   Flu Vaccine QUAD High Dose(Fluad) (Completed)       Return in about 6 months (around 03/18/2023).   Ann Rumps, MD [place]

## 2022-09-16 NOTE — Assessment & Plan Note (Addendum)
Prolia was not affordable.  We will check and see whether or not Fosamax is an option with her GI history.

## 2022-09-16 NOTE — Assessment & Plan Note (Signed)
Possibly related to osteoarthritis.  She is hesitant to use any NSAIDs.  She will ice the area for 10 minutes 3 times a day.  She will contact her orthopedist to consider an injection if appropriate.

## 2022-09-16 NOTE — Assessment & Plan Note (Signed)
Well-controlled.  She will continue HCTZ 25 mg daily.

## 2022-09-16 NOTE — Patient Instructions (Signed)
Nice to see you. I will check on whether or not Fosamax would be an adequate option for you for osteoporosis.  If you do not hear back from [place] in a week or so please let us know.

## 2022-09-16 NOTE — Assessment & Plan Note (Signed)
Recheck today. 

## 2022-09-21 ENCOUNTER — Telehealth: Payer: Self-pay | Admitting: Family Medicine

## 2022-09-21 DIAGNOSIS — M810 Age-related osteoporosis without current pathological fracture: Secondary | ICD-10-CM

## 2022-09-21 NOTE — Telephone Encounter (Signed)
Please let the patient know that I heard back from the pharmacist and she noted the fosamax would not be an option with the patients ulcer history. She was going to look in to assistance options for the prolia for the patient.

## 2022-09-21 NOTE — Telephone Encounter (Signed)
I called and spoke with the patient and informed her that the provider stated the fosamax would not be an option and they would look into patient assistance for the prolia and we will let he know and she understood.  Alexina Niccoli,cma

## 2022-09-23 ENCOUNTER — Telehealth: Payer: Self-pay

## 2022-09-23 DIAGNOSIS — S63502A Unspecified sprain of left wrist, initial encounter: Secondary | ICD-10-CM | POA: Diagnosis not present

## 2022-09-23 DIAGNOSIS — M24811 Other specific joint derangements of right shoulder, not elsewhere classified: Secondary | ICD-10-CM | POA: Diagnosis not present

## 2022-09-23 DIAGNOSIS — M25531 Pain in right wrist: Secondary | ICD-10-CM | POA: Diagnosis not present

## 2022-09-23 DIAGNOSIS — S43401A Unspecified sprain of right shoulder joint, initial encounter: Secondary | ICD-10-CM | POA: Diagnosis not present

## 2022-09-23 NOTE — Chronic Care Management (AMB) (Signed)
   Care Guide Note  09/23/2022 Name: [name] MRN: [id number] DOB: 08/03/1941  Referred by: Leone Haven, MD Reason for referral : Care Coordination (Outreach to schedule with Pharm D )   Ann Spencer is a 81 y.o. year old female who is a primary care patient of Leone Haven, MD. [name] was referred to the pharmacist for assistance related to DM.    Successful contact was made with the patient to discuss pharmacy services including being ready for the pharmacist to call at least 5 minutes before the scheduled appointment time, to have medication bottles and any blood sugar or blood pressure readings ready for review. The patient agreed to meet with the pharmacist via with the pharmacist via telephone visit on 09/30/2022.    Noreene Larsson, [place], [place] [address] Direct Dial: [phone] [email or web address]

## 2022-09-23 NOTE — Addendum Note (Signed)
Addended by: Kaylyn Layer T on: 09/23/2022 02:10 PM   Modules accepted: Orders

## 2022-09-30 ENCOUNTER — Other Ambulatory Visit: Payer: Medicare HMO | Admitting: Pharmacist

## 2022-09-30 NOTE — Progress Notes (Signed)
09/30/2022 Name: Ann Spencer MRN: [id number] DOB: 10/13/1941  Chief Complaint  Patient presents with   Medication Management    Ann Spencer is a 81 y.o. year old female who presented for a telephone visit.   They were referred to the pharmacist by their PCP for assistance in managing medication access.    Subjective:  Care Team: Primary Care Provider: Leone Haven, MD ; Next Scheduled Visit: 03/20/23  Medication Access/Adherence  Current Pharmacy:  [facility] [address], [place] - [place] [address] Phone: [phone] Fax: [phone]   Patient reports affordability concerns with their medications: Yes  - copay for Prolia is cost-prohibitive. 20% of cost of drug - ~ $380 every 6 months.  Patient reports access/transportation concerns to their pharmacy: No  Patient reports adherence concerns with their medications:  No    Osteoporosis:  Current medications: prescribed Prolia Q6 months, but declined recently injection due to cost  Current supplements: calcium supplement once daily, though she doesn't know the strength. Also reports daily consumption of at least 1 dairy source daily. Previously on vitamin D supplementation but had a supratherapeutic level in March, has since stopped supplementation  Most recent DEXA: T score of -2.0 at spine   Health Maintenance  Health Maintenance Due  Topic Date Due   Zoster Vaccines- Shingrix (1 of 2) Never done     Objective: Lab Results  Component Value Date   HGBA1C 5.5 10/28/2019    Lab Results  Component Value Date   CREATININE 0.81 01/24/2022   BUN 18 01/24/2022   NA 139 01/24/2022   K 4.2 01/24/2022   CL 100 01/24/2022   CO2 32 01/24/2022    Lab Results  Component Value Date   CHOL 212 (H) 04/18/2018   HDL 77.00 04/18/2018   LDLCALC 117 (H) 04/18/2018   LDLDIRECT 95.0 04/30/2018   TRIG 91.0 04/18/2018   CHOLHDL 3 04/18/2018    Medications  Reviewed Today     Reviewed by Osker Mason, RPH-CPP (Pharmacist) on 09/30/22 at 1008  Med List Status: <None>   Medication Order Taking? Sig Documenting Provider Last Dose Status Informant  aspirin 81 MG tablet [id number] Yes Take 81 mg by mouth daily. [provider] Taking Active Self  calcium carbonate (OS-CAL - DOSED IN MG OF ELEMENTAL CALCIUM) 1250 (500 Ca) MG tablet [id number] Yes Take 1 tablet by mouth daily. [provider] Taking Active   ferrous sulfate 325 (65 FE) MG EC tablet [id number] Yes Take 325 mg by mouth daily with breakfast. [provider] Taking Active Self  fluticasone (FLONASE) 50 MCG/ACT nasal spray [id number] No Place 2 sprays into both nostrils daily.  Patient not taking: Reported on 09/30/2022   Leone Haven, MD Not Taking Active   hydrochlorothiazide (HYDRODIURIL) 25 MG tablet [id number] Yes Take 1 tablet by mouth once daily Leone Haven, MD Taking Active   lidocaine (PF) (XYLOCAINE) 1 % injection 10 mL [id number]   Milinda Pointer, MD  Consider Medication Status and Discontinue (Completed Course)   Misc Natural Products (OSTEO BI-FLEX JOINT SHIELD PO) [id number] Yes Take 1 tablet by mouth daily.  [provider] Taking Active Self  Multiple Vitamin (MULTIVITAMIN) capsule [id number] Yes Take 1 capsule by mouth daily. [provider] Taking Active Self  Turmeric 500 MG CAPS [id number] Yes Take 1 capsule by mouth daily. [provider] Taking Active   zinc gluconate 50 MG tablet [id number] Yes Take 50  mg by mouth daily. [provider] Taking Active               Assessment/Plan:   Osteoporosis: - Therapy currently cost prohibitive.  - Reviewed recommendation for daily calcium intake of 1200 mg  - Conservation officer, nature program for Prolia. They only offer support for uninsured patients. Copay supply foundations (PAN) is closed at this time. Oral bisphosphonate is inappropriate given  history of gastric ulcer. Options could include IV bisphosphonate vs Evenity vs Forteo; however, given lack of fracture and t score > -2.5, patient does not meet recommendation criteria for anabolic therapy for Evenity or Forteo so unlikely to be able to access.  - Patient notes that she plans to sit down with her insurance agent in the next few weeks. Will evaluate coverage of Prolia after she is able to do so.    Follow Up Plan: patient will call me after she speaks with her insurance agent.   Catie Hedwig Morton, PharmD, [facility] [phone]   .

## 2022-09-30 NOTE — Patient Instructions (Signed)
Ms. Casserly,   It was great talking with you today!  Talk to your insurance agent about plans that provide better coverage for Prolia. Let me know what you find out.   Thanks!  Catie Hedwig Morton, PharmD, [facility] [phone]

## 2022-10-03 DIAGNOSIS — H524 Presbyopia: Secondary | ICD-10-CM | POA: Diagnosis not present

## 2022-10-10 ENCOUNTER — Encounter (INDEPENDENT_AMBULATORY_CARE_PROVIDER_SITE_OTHER): Payer: Self-pay

## 2022-10-27 DIAGNOSIS — H25811 Combined forms of age-related cataract, right eye: Secondary | ICD-10-CM | POA: Diagnosis not present

## 2022-11-10 ENCOUNTER — Other Ambulatory Visit: Payer: Self-pay | Admitting: Family Medicine

## 2022-11-10 DIAGNOSIS — I10 Essential (primary) hypertension: Secondary | ICD-10-CM

## 2022-11-14 DIAGNOSIS — H25811 Combined forms of age-related cataract, right eye: Secondary | ICD-10-CM | POA: Diagnosis not present

## 2022-11-14 DIAGNOSIS — H269 Unspecified cataract: Secondary | ICD-10-CM | POA: Diagnosis not present

## 2022-11-22 DIAGNOSIS — Z6838 Body mass index (BMI) 38.0-38.9, adult: Secondary | ICD-10-CM | POA: Diagnosis not present

## 2022-11-22 DIAGNOSIS — I10 Essential (primary) hypertension: Secondary | ICD-10-CM | POA: Diagnosis not present

## 2022-11-22 DIAGNOSIS — R32 Unspecified urinary incontinence: Secondary | ICD-10-CM | POA: Diagnosis not present

## 2022-11-22 DIAGNOSIS — Z880 Allergy status to penicillin: Secondary | ICD-10-CM | POA: Diagnosis not present

## 2022-11-22 DIAGNOSIS — M1990 Unspecified osteoarthritis, unspecified site: Secondary | ICD-10-CM | POA: Diagnosis not present

## 2022-11-22 DIAGNOSIS — K219 Gastro-esophageal reflux disease without esophagitis: Secondary | ICD-10-CM | POA: Diagnosis not present

## 2022-11-22 DIAGNOSIS — Z87891 Personal history of nicotine dependence: Secondary | ICD-10-CM | POA: Diagnosis not present

## 2022-11-28 DIAGNOSIS — H269 Unspecified cataract: Secondary | ICD-10-CM | POA: Diagnosis not present

## 2022-11-28 DIAGNOSIS — H2512 Age-related nuclear cataract, left eye: Secondary | ICD-10-CM | POA: Diagnosis not present

## 2022-11-28 DIAGNOSIS — H25812 Combined forms of age-related cataract, left eye: Secondary | ICD-10-CM | POA: Diagnosis not present

## 2022-12-12 HISTORY — PX: TRIGGER FINGER RELEASE: SHX641

## 2022-12-13 DIAGNOSIS — M542 Cervicalgia: Secondary | ICD-10-CM | POA: Diagnosis not present

## 2022-12-13 DIAGNOSIS — M5416 Radiculopathy, lumbar region: Secondary | ICD-10-CM | POA: Diagnosis not present

## 2023-01-27 ENCOUNTER — Ambulatory Visit (INDEPENDENT_AMBULATORY_CARE_PROVIDER_SITE_OTHER): Payer: Medicare HMO

## 2023-01-27 VITALS — BP 123/78 | Ht 63.0 in | Wt 236.0 lb

## 2023-01-27 DIAGNOSIS — Z0000 Encounter for general adult medical examination without abnormal findings: Secondary | ICD-10-CM | POA: Diagnosis not present

## 2023-01-27 NOTE — Progress Notes (Signed)
Subjective:   Ann Spencer is a 82 y.o. female who presents for Medicare Annual (Subsequent) preventive examination.  Review of Systems    No ROS.  Medicare Wellness Virtual Visit.  Visual/audio telehealth visit, UTA vital signs.   See social history for additional risk factors.   Cardiac Risk Factors include: advanced age (>70mn, >>18women);hypertension     Objective:    Today's Vitals   01/27/23 1016  BP: 123/78  Weight: 236 lb (107 kg)  Height: 5' 3"$  (1.6 m)   Body mass index is 41.81 kg/m.     01/27/2023   10:17 AM 01/20/2022   11:39 AM 01/19/2021   11:12 AM 01/17/2020   11:17 AM 11/23/2019    6:30 AM 11/22/2019    3:39 PM 11/05/2019    5:06 PM  Advanced Directives  Does Patient Have a Medical Advance Directive? No No No No No No No  Would patient like information on creating a medical advance directive? No - Patient declined No - Patient declined No - Patient declined No - Patient declined No - Patient declined No - Patient declined Yes (Inpatient - patient requests chaplain consult to create a medical advance directive)    Current Medications (verified) Outpatient Encounter Medications as of 01/27/2023  Medication Sig   aspirin 81 MG tablet Take 81 mg by mouth daily.   calcium carbonate (OS-CAL - DOSED IN MG OF ELEMENTAL CALCIUM) 1250 (500 Ca) MG tablet Take 1 tablet by mouth daily.   ferrous sulfate 325 (65 FE) MG EC tablet Take 325 mg by mouth daily with breakfast.   fluticasone (FLONASE) 50 MCG/ACT nasal spray Place 2 sprays into both nostrils daily. (Patient not taking: Reported on 09/30/2022)   hydrochlorothiazide (HYDRODIURIL) 25 MG tablet Take 1 tablet by mouth once daily   Misc Natural Products (OSTEO BI-FLEX JOINT SHIELD PO) Take 1 tablet by mouth daily.    Multiple Vitamin (MULTIVITAMIN) capsule Take 1 capsule by mouth daily.   Turmeric 500 MG CAPS Take 1 capsule by mouth daily.   zinc gluconate 50 MG tablet Take 50 mg by mouth daily.    Facility-Administered Encounter Medications as of 01/27/2023  Medication   lidocaine (PF) (XYLOCAINE) 1 % injection 10 mL    Allergies (verified) Penicillins   History: Past Medical History:  Diagnosis Date   Allergy    Anemia    Arthritis    Cellulitis    GERD (gastroesophageal reflux disease)    History of head injury 11/17/2015   Hypertension    [place]    Phlebitis 2014   Pneumonia due to infectious organism 02/15/2018   Rotator cuff injury 12/2013   Past Surgical History:  Procedure Laterality Date   ABDOMINAL HYSTERECTOMY  1981   Full   COLONOSCOPY     SHOULDER SURGERY Left 03/03/15   TOTAL KNEE ARTHROPLASTY Left 11/05/2019   Procedure: TOTAL KNEE ARTHROPLASTY;  Surgeon: KThornton Park MD;  Location: [facility] ORS;  Service: Orthopedics;  Laterality: Left;   vein closure Bilateral Feb 2008   Family History  Problem Relation Age of Onset   Varicose Veins Brother    Social History   Socioeconomic History   Marital status: Widowed    Spouse name: Not on file   Number of children: Not on file   Years of education: Not on file   Highest education level: Not on file  Occupational History   Not on file  Tobacco Use   Smoking status: Former  Packs/day: 1.00    Years: 30.00    Total pack years: 30.00    Types: Cigarettes    Quit date: 12/12/1993    Years since quitting: 29.1   Smokeless tobacco: Never  Vaping Use   Vaping Use: Never used  Substance and Sexual Activity   Alcohol use: Yes    Alcohol/week: 7.0 standard drinks of alcohol    Types: 7 Standard drinks or equivalent per week    Comment: Wine qhs   Drug use: No   Sexual activity: Never  Other Topics Concern   Not on file  Social History Narrative   [place]   Lives with daughter Beverlee Nims) and grandaughter    2 dogs lives inside   Enjoys reading   Social Determinants of Health   Financial Resource Strain: Low Risk  (01/27/2023)   Overall Financial  Resource Strain (CARDIA)    Difficulty of Paying Living Expenses: Not hard at all  Food Insecurity: No Food Insecurity (01/27/2023)   Hunger Vital Sign    Worried About Running Out of Food in the Last Year: Never true    Ran Out of Food in the Last Year: Never true  Transportation Needs: No Transportation Needs (01/27/2023)   PRAPARE - Hydrologist (Medical): No    Lack of Transportation (Non-Medical): No  Physical Activity: Insufficiently Active (01/27/2023)   Exercise Vital Sign    Days of Exercise per Week: 7 days    Minutes of Exercise per Session: 20 min  Stress: No Stress Concern Present (01/27/2023)   [place]    Feeling of Stress : Not at all  Social Connections: Unknown (01/27/2023)   Social Connection and Isolation Panel [NHANES]    Frequency of Communication with Friends and Family: More than three times a week    Frequency of Social Gatherings with Friends and Family: More than three times a week    Attends Religious Services: Not on file    Active Member of Clubs or Organizations: Not on file    Attends Archivist Meetings: Not on file    Marital Status: Widowed    Tobacco Counseling Counseling given: Not Answered   Clinical Intake:  Pre-visit preparation completed: Yes        Diabetes: No  How often do you need to have someone help you when you read instructions, pamphlets, or other written materials from your doctor or pharmacy?: 1 - Never    Interpreter Needed?: No      Activities of Daily Living    01/27/2023   10:20 AM  In your present state of health, do you have any difficulty performing the following activities:  Hearing? 0  Vision? 0  Difficulty concentrating or making decisions? 0  Walking or climbing stairs? 0  Comment Walker in use for balance  Dressing or bathing? 0  Doing errands, shopping? 0  Preparing Food and eating ? N  Using  the Toilet? N  In the past six months, have you accidently leaked urine? Y  Comment Managed with daily pad. Followed by PCP.  Do you have problems with loss of bowel control? N  Managing your Medications? N  Managing your Finances? N  Housekeeping or managing your Housekeeping? N    Patient Care Team: Leone Haven, MD as PCP - General (Family Medicine) Christene Lye, MD (General Surgery) Leward Quan, MD (Family Medicine) Rubbie Battiest, RN (Inactive) as  Nurse Practitioner (Gerontology) Osker Mason, RPH-CPP (Pharmacist)  Indicate any recent Medical Services you may have received from other than [facility] providers in the past year (date may be approximate).     Assessment:   This is a routine wellness examination for Kameisha.  I connected with  Carnelia Keer Veronica on 01/27/23 by a audio enabled telemedicine application and verified that I am speaking with the correct person using two identifiers.  Patient Location: Home  Provider Location: Office/Clinic  I discussed the limitations of evaluation and management by telemedicine. The patient expressed understanding and agreed to proceed.   Hearing/Vision screen Hearing Screening - Comments:: Patient is able to hear conversational tones without difficulty.  No issues reported.  Vision Screening - Comments:: Followed by [facility] reading lenses  Cataracts extracted, bilateral They have seen their ophthalmologist in the last 12 months.    Dietary issues and exercise activities discussed: Current Exercise Habits: Home exercise routine, Type of exercise: walking, Time (Minutes): 20, Frequency (Times/Week): 7, Weekly Exercise (Minutes/Week): 140, Intensity: Mild Soft diet Drinks mostly coffee and green tea   Goals Addressed               This Visit's Progress     Patient Stated     Weight (lb) < 222 lb 12.8 oz (101.1 kg) (pt-stated)        Low cholesterol/low carb diet Walk laps in the home and  neighborhood        Depression Screen    01/27/2023   10:24 AM 09/16/2022    1:58 PM 01/20/2022   11:29 AM 04/23/2021    1:51 PM 01/19/2021   11:12 AM 09/11/2020    1:56 PM 01/17/2020   11:17 AM  PHQ 2/9 Scores  PHQ - 2 Score 0 0 0 0 0 0 0    Fall Risk    01/27/2023   10:24 AM 09/16/2022    1:58 PM 01/20/2022   11:31 AM 04/23/2021    1:50 PM 01/19/2021   11:12 AM  [place] in the past year? 1 0  1 1  Number falls in past yr: 1 0  0 1  Injury with Fall? 0 0  0 0  Risk for fall due to :  No Fall Risks     Follow up Falls evaluation completed;Falls prevention discussed Falls evaluation completed Falls evaluation completed Falls evaluation completed Falls evaluation completed    FALL RISK PREVENTION PERTAINING TO THE HOME: Home free of loose throw rugs in walkways, pet beds, electrical cords, etc? Yes  Adequate lighting in your home to reduce risk of falls? Yes   ASSISTIVE DEVICES UTILIZED TO PREVENT FALLS: Life alert? No  Cell phone on person? Yes Use of a cane, walker or w/c? Yes , walker as needed for safety.  Grab bars in the bathroom? Yes  Shower chair or bench in shower? No  Elevated toilet seat or a handicapped toilet? No   TIMED UP AND GO: Was the test performed? No .   Cognitive Function:        01/27/2023   10:37 AM 01/17/2020   11:19 AM 01/08/2019   11:28 AM  6CIT Screen  What Year? 0 points 0 points 0 points  What month? 0 points 0 points 0 points  What time? 0 points 0 points 0 points  Count back from 20 0 points 0 points 0 points  Months in reverse 0 points 0 points 0 points  Repeat  phrase 0 points 0 points 0 points  Total Score 0 points 0 points 0 points    Immunizations Immunization History  Administered Date(s) Administered   Fluad Quad(high Dose 65+) 10/03/2019, 09/11/2020, 10/13/2021, 09/16/2022   Influenza, High Dose Seasonal PF 01/10/2017, 09/04/2017, 09/07/2018   Moderna Sars-Covid-2 Vaccination 04/05/2021   Pneumococcal Conjugate-13  02/20/2015   Tdap 12/21/2013   Pneumococcal vaccine status: Due, Education has been provided regarding the importance of this vaccine. Advised may receive this vaccine at local pharmacy or Health Dept. Aware to provide a copy of the vaccination record if obtained from local pharmacy or Health Dept. Verbalized acceptance and understanding.  Covid-19 vaccine status: Completed vaccines x1.  Shingrix Completed?: No.    Education has been provided regarding the importance of this vaccine. Patient has been advised to call insurance company to determine out of pocket expense if they have not yet received this vaccine. Advised may also receive vaccine at local pharmacy or Health Dept. Verbalized acceptance and understanding.  Screening Tests Health Maintenance  Topic Date Due   COVID-19 Vaccine (2 - 2023-24 season) 02/12/2023 (Originally 08/12/2022)   Zoster Vaccines- Shingrix (1 of 2) 04/27/2023 (Originally 09/06/1992)   Pneumonia Vaccine 74+ Years old (2 of 2 - PPSV23 or PCV20) 01/28/2024 (Originally 02/20/2016)   DTaP/Tdap/Td (2 - Td or Tdap) 12/22/2023   Medicare Annual Wellness (AWV)  01/28/2024   INFLUENZA VACCINE  Completed   DEXA SCAN  Completed   HPV VACCINES  Aged Out   Health Maintenance There are no preventive care reminders to display for this patient.  Mammogram- deferred ordering today, per patient preference.   Lung Cancer Screening: (Low Dose CT Chest recommended if Age 8-80 years, 30 pack-year currently smoking OR have quit w/in 15years.) does not qualify.   Hepatitis C Screening: does not qualify.  Vision Screening: Recommended annual ophthalmology exams for early detection of glaucoma and other disorders of the eye.  Dental Screening: Recommended annual dental exams for proper oral hygiene  Community Resource Referral / Chronic Care Management: CRR required this visit?  No   CCM required this visit?  No      Plan:     I have personally reviewed and noted the  following in the patient's chart:   Medical and social history Use of alcohol, tobacco or illicit drugs  Current medications and supplements including opioid prescriptions. Patient is not currently taking opioid prescriptions. Functional ability and status Nutritional status Physical activity Advanced directives List of other physicians Hospitalizations, surgeries, and ER visits in previous 12 months Vitals Screenings to include cognitive, depression, and falls Referrals and appointments  In addition, I have reviewed and discussed with patient certain preventive protocols, quality metrics, and best practice recommendations. A written personalized care plan for preventive services as well as general preventive health recommendations were provided to patient.     Leta Jungling, LPN   [id number]

## 2023-01-27 NOTE — Patient Instructions (Addendum)
Ann Spencer , Thank you for taking time to come for your Medicare Wellness Visit. I appreciate your ongoing commitment to your health goals. Please review the following plan we discussed and let me know if I can assist you in the future.   These are the goals we discussed:  Goals       Patient Stated     Weight (lb) < 222 lb 12.8 oz (101.1 kg) (pt-stated)      Low cholesterol/low carb diet Walk laps in the home and neighborhood         This is a list of the screening recommended for you and due dates:  Health Maintenance  Topic Date Due   COVID-19 Vaccine (2 - 2023-24 season) 02/12/2023*   Zoster (Shingles) Vaccine (1 of 2) 04/27/2023*   Pneumonia Vaccine (2 of 2 - PPSV23 or PCV20) 01/28/2024*   DTaP/Tdap/Td vaccine (2 - Td or Tdap) 12/22/2023   Medicare Annual Wellness Visit  01/28/2024   Flu Shot  Completed   DEXA scan (bone density measurement)  Completed   HPV Vaccine  Aged Out  *Topic was postponed. The date shown is not the original due date.    Advanced directives: End of life planning; Advance aging; Advanced directives discussed.  Copy of current HCPOA/Living Will requested.    Conditions/risks identified: none new  Next appointment: Follow up in one year for your annual wellness visit    Preventive Care 65 Years and Older, Female Preventive care refers to lifestyle choices and visits with your health care provider that can promote health and wellness. What does preventive care include? A yearly physical exam. This is also called an annual well check. Dental exams once or twice a year. Routine eye exams. Ask your health care provider how often you should have your eyes checked. Personal lifestyle choices, including: Daily care of your teeth and gums. Regular physical activity. Eating a healthy diet. Avoiding tobacco and drug use. Limiting alcohol use. Practicing safe sex. Taking low-dose aspirin every day. Taking vitamin and mineral supplements as  recommended by your health care provider. What happens during an annual well check? The services and screenings done by your health care provider during your annual well check will depend on your age, overall health, lifestyle risk factors, and family history of disease. Counseling  Your health care provider may ask you questions about your: Alcohol use. Tobacco use. Drug use. Emotional well-being. Home and relationship well-being. Sexual activity. Eating habits. History of falls. Memory and ability to understand (cognition). Work and work Statistician. Reproductive health. Screening  You may have the following tests or measurements: Height, weight, and BMI. Blood pressure. Lipid and cholesterol levels. These may be checked every 5 years, or more frequently if you are over 64 years old. Skin check. Lung cancer screening. You may have this screening every year starting at age 72 if you have a 30-pack-year history of smoking and currently smoke or have quit within the past 15 years. Fecal occult blood test (FOBT) of the stool. You may have this test every year starting at age 25. Flexible sigmoidoscopy or colonoscopy. You may have a sigmoidoscopy every 5 years or a colonoscopy every 10 years starting at age 79. Hepatitis C blood test. Hepatitis B blood test. Sexually transmitted disease (STD) testing. Diabetes screening. This is done by checking your blood sugar (glucose) after you have not eaten for a while (fasting). You may have this done every 1-3 years. Bone density scan. This is done to  screen for osteoporosis. You may have this done starting at age 71. Mammogram. This may be done every 1-2 years. Talk to your health care provider about how often you should have regular mammograms. Talk with your health care provider about your test results, treatment options, and if necessary, the need for more tests. Vaccines  Your health care provider may recommend certain vaccines, such  as: Influenza vaccine. This is recommended every year. Tetanus, diphtheria, and acellular pertussis (Tdap, Td) vaccine. You may need a Td booster every 10 years. Zoster vaccine. You may need this after age 59. Pneumococcal 13-valent conjugate (PCV13) vaccine. One dose is recommended after age 38. Pneumococcal polysaccharide (PPSV23) vaccine. One dose is recommended after age 68. Talk to your health care provider about which screenings and vaccines you need and how often you need them. This information is not intended to replace advice given to you by your health care provider. Make sure you discuss any questions you have with your health care provider. Document Released: 12/25/2015 Document Revised: 08/17/2016 Document Reviewed: 09/29/2015 Elsevier Interactive Patient Education  2017 [place] Prevention in the Home Falls can cause injuries. They can happen to people of all ages. There are many things you can do to make your home safe and to help prevent falls. What can I do on the outside of my home? Regularly fix the edges of walkways and driveways and fix any cracks. Remove anything that might make you trip as you walk through a door, such as a raised step or threshold. Trim any bushes or trees on the path to your home. Use bright outdoor lighting. Clear any walking paths of anything that might make someone trip, such as rocks or tools. Regularly check to see if handrails are loose or broken. Make sure that both sides of any steps have handrails. Any raised decks and porches should have guardrails on the edges. Have any leaves, snow, or ice cleared regularly. Use sand or salt on walking paths during winter. Clean up any spills in your garage right away. This includes oil or grease spills. What can I do in the bathroom? Use night lights. Install grab bars by the toilet and in the tub and shower. Do not use towel bars as grab bars. Use non-skid mats or decals in the tub or  shower. If you need to sit down in the shower, use a plastic, non-slip stool. Keep the floor dry. Clean up any water that spills on the floor as soon as it happens. Remove soap buildup in the tub or shower regularly. Attach bath mats securely with double-sided non-slip rug tape. Do not have throw rugs and other things on the floor that can make you trip. What can I do in the bedroom? Use night lights. Make sure that you have a light by your bed that is easy to reach. Do not use any sheets or blankets that are too big for your bed. They should not hang down onto the floor. Have a firm chair that has side arms. You can use this for support while you get dressed. Do not have throw rugs and other things on the floor that can make you trip. What can I do in the kitchen? Clean up any spills right away. Avoid walking on wet floors. Keep items that you use a lot in easy-to-reach places. If you need to reach something above you, use a strong step stool that has a grab bar. Keep electrical cords out of the way.  Do not use floor polish or wax that makes floors slippery. If you must use wax, use non-skid floor wax. Do not have throw rugs and other things on the floor that can make you trip. What can I do with my stairs? Do not leave any items on the stairs. Make sure that there are handrails on both sides of the stairs and use them. Fix handrails that are broken or loose. Make sure that handrails are as long as the stairways. Check any carpeting to make sure that it is firmly attached to the stairs. Fix any carpet that is loose or worn. Avoid having throw rugs at the top or bottom of the stairs. If you do have throw rugs, attach them to the floor with carpet tape. Make sure that you have a light switch at the top of the stairs and the bottom of the stairs. If you do not have them, ask someone to add them for you. What else can I do to help prevent falls? Wear shoes that: Do not have high heels. Have  rubber bottoms. Are comfortable and fit you well. Are closed at the toe. Do not wear sandals. If you use a stepladder: Make sure that it is fully opened. Do not climb a closed stepladder. Make sure that both sides of the stepladder are locked into place. Ask someone to hold it for you, if possible. Clearly mark and make sure that you can see: Any grab bars or handrails. First and last steps. Where the edge of each step is. Use tools that help you move around (mobility aids) if they are needed. These include: Canes. Walkers. Scooters. Crutches. Turn on the lights when you go into a dark area. Replace any light bulbs as soon as they burn out. Set up your furniture so you have a clear path. Avoid moving your furniture around. If any of your floors are uneven, fix them. If there are any pets around you, be aware of where they are. Review your medicines with your doctor. Some medicines can make you feel dizzy. This can increase your chance of falling. Ask your doctor what other things that you can do to help prevent falls. This information is not intended to replace advice given to you by your health care provider. Make sure you discuss any questions you have with your health care provider. Document Released: 09/24/2009 Document Revised: 05/05/2016 Document Reviewed: 01/02/2015 Elsevier Interactive Patient Education  2017 [employer].

## 2023-01-31 ENCOUNTER — Ambulatory Visit: Payer: Medicare HMO | Admitting: Family Medicine

## 2023-01-31 DIAGNOSIS — M5416 Radiculopathy, lumbar region: Secondary | ICD-10-CM | POA: Diagnosis not present

## 2023-01-31 DIAGNOSIS — M542 Cervicalgia: Secondary | ICD-10-CM | POA: Diagnosis not present

## 2023-01-31 DIAGNOSIS — M5030 Other cervical disc degeneration, unspecified cervical region: Secondary | ICD-10-CM | POA: Diagnosis not present

## 2023-02-01 ENCOUNTER — Ambulatory Visit (INDEPENDENT_AMBULATORY_CARE_PROVIDER_SITE_OTHER): Payer: Medicare HMO | Admitting: Family Medicine

## 2023-02-01 ENCOUNTER — Encounter: Payer: Self-pay | Admitting: Family Medicine

## 2023-02-01 VITALS — BP 118/74 | HR 79 | Temp 97.6°F | Ht 63.0 in | Wt 235.0 lb

## 2023-02-01 DIAGNOSIS — E785 Hyperlipidemia, unspecified: Secondary | ICD-10-CM

## 2023-02-01 DIAGNOSIS — R3915 Urgency of urination: Secondary | ICD-10-CM | POA: Diagnosis not present

## 2023-02-01 DIAGNOSIS — W19XXXD Unspecified fall, subsequent encounter: Secondary | ICD-10-CM | POA: Diagnosis not present

## 2023-02-01 DIAGNOSIS — I10 Essential (primary) hypertension: Secondary | ICD-10-CM

## 2023-02-01 DIAGNOSIS — R809 Proteinuria, unspecified: Secondary | ICD-10-CM

## 2023-02-01 DIAGNOSIS — W19XXXA Unspecified fall, initial encounter: Secondary | ICD-10-CM

## 2023-02-01 LAB — LIPID PANEL
Cholesterol: 172 mg/dL (ref 0–200)
HDL: 58.6 mg/dL (ref >39.00)
LDL Cholesterol: 95 mg/dL (ref 0–99)
NonHDL: 113.58
Total CHOL/HDL Ratio: 3
Triglycerides: 94 mg/dL (ref 0.0–149.0)
VLDL: 18.8 mg/dL (ref 0.0–40.0)

## 2023-02-01 LAB — COMPREHENSIVE METABOLIC PANEL
ALT: 30 U/L (ref 0–35)
AST: 23 U/L (ref 0–37)
Albumin: 4.1 g/dL (ref 3.5–5.2)
Alkaline Phosphatase: 60 U/L (ref 39–117)
BUN: 20 mg/dL (ref 6–23)
CO2: 32 mEq/L (ref 19–32)
Calcium: 10.3 mg/dL (ref 8.4–10.5)
Chloride: 99 mEq/L (ref 96–112)
Creatinine, Ser: 0.89 mg/dL (ref 0.40–1.20)
GFR: 61.24 mL/min (ref >60.00)
Glucose, Bld: 103 mg/dL — ABNORMAL HIGH (ref 70–99)
Potassium: 4.5 mEq/L (ref 3.5–5.1)
Sodium: 140 mEq/L (ref 135–145)
Total Bilirubin: 0.5 mg/dL (ref 0.2–1.2)
Total Protein: 7.1 g/dL (ref 6.0–8.3)

## 2023-02-01 LAB — POCT URINALYSIS DIPSTICK
Bilirubin, UA: NEGATIVE
Blood, UA: NEGATIVE
Glucose, UA: NEGATIVE
Ketones, UA: NEGATIVE
Leukocytes, UA: NEGATIVE
Nitrite, UA: NEGATIVE
Protein, UA: POSITIVE — AB
Spec Grav, UA: 1.01 (ref 1.010–1.025)
Urobilinogen, UA: NEGATIVE E.U./dL — AB
pH, UA: 7 (ref 5.0–8.0)

## 2023-02-01 LAB — HEMOGLOBIN A1C: Hgb A1c MFr Bld: 5.7 % (ref 4.6–6.5)

## 2023-02-01 NOTE — Progress Notes (Signed)
Tommi Rumps, MD Phone: [phone]  Ann Spencer is a 82 y.o. female who presents today for follow-up.  HYPERTENSION Disease Monitoring Home BP Monitoring <130/80 Chest pain- no    Dyspnea- no Medications Compliance-  taking HCTZ.  Edema- no change to chronic edema BMET    Component Value Date/Time   NA 139 01/24/2022 1157   NA 138 02/14/2014 1329   K 4.2 01/24/2022 1157   K 4.2 02/14/2014 1329   CL 100 01/24/2022 1157   CL 104 02/14/2014 1329   CO2 32 01/24/2022 1157   CO2 26 02/14/2014 1329   GLUCOSE 93 01/24/2022 1157   GLUCOSE 105 (H) 02/14/2014 1329   BUN 18 01/24/2022 1157   BUN 13 02/14/2014 1329   CREATININE 0.81 01/24/2022 1157   CREATININE 0.85 09/11/2020 1436   CALCIUM 10.0 01/24/2022 1157   CALCIUM 9.4 02/14/2014 1329   GFRNONAA >60 11/25/2019 0500   GFRNONAA 56 (L) 03/05/2015 0145   GFRAA >60 11/25/2019 0500   GFRAA >60 03/05/2015 0145   Morbid obesity: Patient notes she loves to eat.  She does try to limit her chocolate intake.  Is not able to exercise much related to arthritic issues.  Falls: Patient notes a couple of falls over the last couple of months.  She has seen orthopedics for this recently.  They suggested she do physical therapy.  She is been using a cane.  Urinary urgency: Patient reports chronic issues with urinary urgency and incontinence related to the urgency.  She does note some urinary frequency.  No dysuria.  She does note some nocturia.  Social History   Tobacco Use  Smoking Status Former   Packs/day: 1.00   Years: 30.00   Total pack years: 30.00   Types: Cigarettes   Quit date: 12/12/1993   Years since quitting: 29.1  Smokeless Tobacco Never    Current Outpatient Medications on File Prior to Visit  Medication Sig Dispense Refill   aspirin 81 MG tablet Take 81 mg by mouth daily.     calcium carbonate (OS-CAL - DOSED IN MG OF ELEMENTAL CALCIUM) 1250 (500 Ca) MG tablet Take 1 tablet by mouth daily.     ferrous sulfate  325 (65 FE) MG EC tablet Take 325 mg by mouth daily with breakfast.     hydrochlorothiazide (HYDRODIURIL) 25 MG tablet Take 1 tablet by mouth once daily 90 tablet 0   Misc Natural Products (OSTEO BI-FLEX JOINT SHIELD PO) Take 1 tablet by mouth daily.      Multiple Vitamin (MULTIVITAMIN) capsule Take 1 capsule by mouth daily.     Turmeric 500 MG CAPS Take 1 capsule by mouth daily.     zinc gluconate 50 MG tablet Take 50 mg by mouth daily.     Current Facility-Administered Medications on File Prior to Visit  Medication Dose Route Frequency Provider Last Rate Last Admin   lidocaine (PF) (XYLOCAINE) 1 % injection 10 mL  10 mL Other Once Milinda Pointer, MD         ROS see history of present illness  Objective  Physical Exam Vitals:   02/01/23 1055 02/01/23 1116  BP: 124/82 118/74  Pulse: 79   Temp: 97.6 F (36.4 C)   SpO2: 99%     BP Readings from Last 3 Encounters:  02/01/23 118/74  01/27/23 123/78  09/16/22 130/70   Wt Readings from Last 3 Encounters:  02/01/23 235 lb (106.6 kg)  01/27/23 236 lb (107 kg)  09/16/22 238 lb 3.2 oz (  108 kg)    Physical Exam Constitutional:      General: She is not in acute distress.    Appearance: She is not diaphoretic.  Cardiovascular:     Rate and Rhythm: Normal rate and regular rhythm.     Heart sounds: Normal heart sounds.  Pulmonary:     Effort: Pulmonary effort is normal.     Breath sounds: Normal breath sounds.  Musculoskeletal:     Right lower leg: No edema.     Left lower leg: No edema.  Skin:    General: Skin is warm and dry.  Neurological:     Mental Status: She is alert.      Assessment/Plan: Please see individual problem list.  Essential hypertension Assessment & Plan: Chronic issue.  Well-controlled at home.  She will continue HCTZ 25 mg daily.  Check labs.  Orders: -     Comprehensive metabolic panel -     Lipid panel -     Hemoglobin A1c  Hyperlipidemia, unspecified hyperlipidemia type Assessment &  Plan: Chronic issue.  Check cholesterol.  Orders: -     Comprehensive metabolic panel -     Lipid panel  Morbid obesity ([place]) Assessment & Plan: Chronic issue.  Discussed dietary changes.  Encouraged her to reduce portion sizes and eat healthier options.  Discussed trying to remain as active as she is able to.  Orders: -     Hemoglobin A1c  Fall, initial encounter Assessment & Plan: Discussed physical therapy to help with falls prevention.  She is not ready to do this yet.  She will let us or orthopedics know when she is ready to start physical therapy.   Urinary urgency Assessment & Plan: Chronic issue.  Likely related to overactive bladder.  We will check a urinalysis today and if negative we could consider treatment for overactive bladder.  Orders: -     POCT urinalysis dipstick    Return in about 3 months (around 05/02/2023) for Hypertension/weight.   Tommi Rumps, MD [place]

## 2023-02-01 NOTE — Patient Instructions (Signed)
Nice to see you. Please let us know when you are ready to do physical therapy. Please reduce your portion sizes when you eat. Will contact you with the urine results and figure out what we can do for your urinary urgency and incontinence once the urine test comes back.

## 2023-02-01 NOTE — Assessment & Plan Note (Signed)
Discussed physical therapy to help with falls prevention.  She is not ready to do this yet.  She will let us or orthopedics know when she is ready to start physical therapy.

## 2023-02-01 NOTE — Assessment & Plan Note (Signed)
Chronic issue.  Well-controlled at home.  She will continue HCTZ 25 mg daily.  Check labs.

## 2023-02-01 NOTE — Assessment & Plan Note (Signed)
Chronic issue.  Check cholesterol.

## 2023-02-01 NOTE — Assessment & Plan Note (Signed)
Chronic issue.  Discussed dietary changes.  Encouraged her to reduce portion sizes and eat healthier options.  Discussed trying to remain as active as she is able to.

## 2023-02-01 NOTE — Assessment & Plan Note (Signed)
Chronic issue.  Likely related to overactive bladder.  We will check a urinalysis today and if negative we could consider treatment for overactive bladder.

## 2023-02-02 LAB — PROTEIN / CREATININE RATIO, URINE
Creatinine, Urine: 41 mg/dL (ref 20–275)
Total Protein, Urine: <4 mg/dL — ABNORMAL LOW (ref 5–24)

## 2023-03-03 ENCOUNTER — Other Ambulatory Visit: Payer: Self-pay | Admitting: Family Medicine

## 2023-03-03 DIAGNOSIS — I10 Essential (primary) hypertension: Secondary | ICD-10-CM

## 2023-03-20 ENCOUNTER — Ambulatory Visit: Payer: Medicare HMO | Admitting: Family Medicine

## 2023-03-27 DIAGNOSIS — M19011 Primary osteoarthritis, right shoulder: Secondary | ICD-10-CM | POA: Diagnosis not present

## 2023-03-27 DIAGNOSIS — M25511 Pain in right shoulder: Secondary | ICD-10-CM | POA: Diagnosis not present

## 2023-04-07 DIAGNOSIS — M25511 Pain in right shoulder: Secondary | ICD-10-CM | POA: Diagnosis not present

## 2023-05-05 ENCOUNTER — Ambulatory Visit: Payer: Medicare HMO | Admitting: Family Medicine

## 2023-05-26 ENCOUNTER — Ambulatory Visit: Payer: Medicare HMO | Admitting: Family Medicine

## 2023-06-01 ENCOUNTER — Other Ambulatory Visit: Payer: Self-pay | Admitting: Family Medicine

## 2023-06-01 DIAGNOSIS — I10 Essential (primary) hypertension: Secondary | ICD-10-CM

## 2023-07-07 ENCOUNTER — Encounter: Payer: Self-pay | Admitting: Family Medicine

## 2023-07-07 ENCOUNTER — Ambulatory Visit (INDEPENDENT_AMBULATORY_CARE_PROVIDER_SITE_OTHER): Payer: Medicare HMO | Admitting: Family Medicine

## 2023-07-07 DIAGNOSIS — M810 Age-related osteoporosis without current pathological fracture: Secondary | ICD-10-CM

## 2023-07-07 DIAGNOSIS — I10 Essential (primary) hypertension: Secondary | ICD-10-CM

## 2023-07-07 DIAGNOSIS — Z6841 Body Mass Index (BMI) 40.0 and over, adult: Secondary | ICD-10-CM | POA: Diagnosis not present

## 2023-07-07 NOTE — Assessment & Plan Note (Signed)
Chronic issue.  She will call to schedule bone density scan.  Discussed that Fosamax likely would not be an option for her given her history of gastric ulcers previously.  We will see what her DEXA scan results reveal and then consider treatment options.

## 2023-07-07 NOTE — Patient Instructions (Signed)
Nice to see you. Please call [phone] to schedule your bone density scan. 

## 2023-07-07 NOTE — Progress Notes (Signed)
Marikay Alar, MD Phone: [phone]  Ann Spencer is a 82 y.o. female who presents today for f/u.  HYPERTENSION Disease Monitoring Home BP Monitoring not checking frequently Chest pain- no    Dyspnea- no Medications Compliance-  taking HCTZ Edema- no BMET    Component Value Date/Time   NA 140 02/01/2023 1122   NA 138 02/14/2014 1329   K 4.5 02/01/2023 1122   K 4.2 02/14/2014 1329   CL 99 02/01/2023 1122   CL 104 02/14/2014 1329   CO2 32 02/01/2023 1122   CO2 26 02/14/2014 1329   GLUCOSE 103 (H) 02/01/2023 1122   GLUCOSE 105 (H) 02/14/2014 1329   BUN 20 02/01/2023 1122   BUN 13 02/14/2014 1329   CREATININE 0.89 02/01/2023 1122   CREATININE 0.85 09/11/2020 1436   CALCIUM 10.3 02/01/2023 1122   CALCIUM 9.4 02/14/2014 1329   GFRNONAA >60 11/25/2019 0500   GFRNONAA 56 (L) 03/05/2015 0145   GFRAA >60 11/25/2019 0500   GFRAA >60 03/05/2015 0145   Obesity: Patient walks some around her house.  She notes she loves to eat.  She typically does not have her first meal of the day until around lunchtime it can consist of breakfast foods or lunch foods.  Does have sausage or bacon 1-2 times a week.  Dinner consists of chicken.  She does get plenty of fruits and vegetables.  Notes her weight is generally maintaining.  Social History   Tobacco Use  Smoking Status Former   Current packs/day: 0.00   Average packs/day: 1 pack/day for 30.0 years (30.0 ttl pk-yrs)   Types: Cigarettes   Start date: 12/13/1963   Quit date: 12/12/1993   Years since quitting: 29.5  Smokeless Tobacco Never    Current Outpatient Medications on File Prior to Visit  Medication Sig Dispense Refill   aspirin 81 MG tablet Take 81 mg by mouth daily.     calcium carbonate (OS-CAL - DOSED IN MG OF ELEMENTAL CALCIUM) 1250 (500 Ca) MG tablet Take 1 tablet by mouth daily.     ferrous sulfate 325 (65 FE) MG EC tablet Take 325 mg by mouth daily with breakfast.     hydrochlorothiazide (HYDRODIURIL) 25 MG tablet Take  1 tablet by mouth once daily 90 tablet 0   Misc Natural Products (OSTEO BI-FLEX JOINT SHIELD PO) Take 1 tablet by mouth daily.      Multiple Vitamin (MULTIVITAMIN) capsule Take 1 capsule by mouth daily.     Turmeric 500 MG CAPS Take 1 capsule by mouth daily.     zinc gluconate 50 MG tablet Take 50 mg by mouth daily.     Current Facility-Administered Medications on File Prior to Visit  Medication Dose Route Frequency Provider Last Rate Last Admin   lidocaine (PF) (XYLOCAINE) 1 % injection 10 mL  10 mL Other Once Delano Metz, MD         ROS see history of present illness  Objective  Physical Exam Vitals:   07/07/23 1405  BP: 126/74  Pulse: 79  Temp: 98.2 F (36.8 C)  SpO2: 96%    BP Readings from Last 3 Encounters:  07/07/23 126/74  02/01/23 118/74  01/27/23 123/78   Wt Readings from Last 3 Encounters:  07/07/23 233 lb (105.7 kg)  02/01/23 235 lb (106.6 kg)  01/27/23 236 lb (107 kg)    Physical Exam Constitutional:      General: She is not in acute distress.    Appearance: She is not diaphoretic.  Cardiovascular:     Rate and Rhythm: Normal rate and regular rhythm.     Heart sounds: Normal heart sounds.  Pulmonary:     Effort: Pulmonary effort is normal.     Breath sounds: Normal breath sounds.  Skin:    General: Skin is warm and dry.  Neurological:     Mental Status: She is alert.      Assessment/Plan: Please see individual problem list.  Morbid obesity ([facility]) Assessment & Plan: Chronic issue.  I encouraged her to increase her exercise.  She will continue with the relatively healthy diet.   Essential hypertension Assessment & Plan: Chronic issue.  Well-controlled.  Continue HCTZ 25 mg daily.   Age-related osteoporosis without current pathological fracture Assessment & Plan: Chronic issue.  She will call to schedule bone density scan.  Discussed that Fosamax likely would not be an option for her given her history of gastric ulcers previously.   We will see what her DEXA scan results reveal and then consider treatment options.  Orders: -     DG Bone Density; Future -     VITAMIN D 25 Hydroxy (Vit-D Deficiency, Fractures)     Return in about 6 months (around 01/07/2024).   Marikay Alar, MD [facility]

## 2023-07-07 NOTE — Assessment & Plan Note (Signed)
Chronic issue.  Well-controlled.  Continue HCTZ 25 mg daily.

## 2023-07-07 NOTE — Assessment & Plan Note (Signed)
Chronic issue.  I encouraged her to increase her exercise.  She will continue with the relatively healthy diet.

## 2023-08-24 ENCOUNTER — Ambulatory Visit
Admission: RE | Admit: 2023-08-24 | Discharge: 2023-08-24 | Disposition: A | Discharge status: Home / Self Care | Payer: Medicare HMO | Source: Ambulatory Visit | Attending: Family Medicine | Admitting: Family Medicine

## 2023-08-24 ENCOUNTER — Other Ambulatory Visit: Payer: Self-pay | Admitting: Family Medicine

## 2023-08-24 DIAGNOSIS — Z780 Asymptomatic menopausal state: Secondary | ICD-10-CM | POA: Diagnosis not present

## 2023-08-24 DIAGNOSIS — Z1231 Encounter for screening mammogram for malignant neoplasm of breast: Secondary | ICD-10-CM | POA: Insufficient documentation

## 2023-08-24 DIAGNOSIS — M810 Age-related osteoporosis without current pathological fracture: Secondary | ICD-10-CM | POA: Insufficient documentation

## 2023-08-24 DIAGNOSIS — M8588 Other specified disorders of bone density and structure, other site: Secondary | ICD-10-CM | POA: Diagnosis not present

## 2023-08-28 ENCOUNTER — Other Ambulatory Visit: Payer: Self-pay | Admitting: Family Medicine

## 2023-08-28 DIAGNOSIS — I10 Essential (primary) hypertension: Secondary | ICD-10-CM

## 2023-10-10 DIAGNOSIS — M65311 Trigger thumb, right thumb: Secondary | ICD-10-CM | POA: Diagnosis not present

## 2023-10-25 ENCOUNTER — Encounter: Payer: Self-pay | Admitting: Family

## 2023-10-25 ENCOUNTER — Ambulatory Visit: Payer: Medicare HMO | Admitting: Family

## 2023-10-25 ENCOUNTER — Ambulatory Visit: Payer: Medicare HMO

## 2023-10-25 VITALS — BP 136/80 | HR 84 | Temp 97.7°F | Ht 63.0 in | Wt 234.0 lb

## 2023-10-25 DIAGNOSIS — R0981 Nasal congestion: Secondary | ICD-10-CM

## 2023-10-25 DIAGNOSIS — K449 Diaphragmatic hernia without obstruction or gangrene: Secondary | ICD-10-CM | POA: Diagnosis not present

## 2023-10-25 DIAGNOSIS — R0602 Shortness of breath: Secondary | ICD-10-CM | POA: Diagnosis not present

## 2023-10-25 MED ORDER — LORATADINE 10 MG PO TABS: 10.0000 mg | ORAL_TABLET | Freq: Every day | ORAL | 1 refills | Status: DC

## 2023-10-25 NOTE — Progress Notes (Unsigned)
   Assessment & Plan:  There are no diagnoses linked to this encounter.   Return precautions given.   Risks, benefits, and alternatives of the medications and treatment plan prescribed today were discussed, and patient expressed understanding.   Education regarding symptom management and diagnosis given to patient on AVS either electronically or printed.  No follow-ups on file.  Ann Plowman, FNP  Subjective:    Patient ID: Ann Spencer, female    DOB: Nov 23, 1941, 82 y.o.   MRN: [id number]  CC: Ann Spencer is a 82 y.o. female who presents today for an acute visit.    HPI: HPI Complains of nasal congestion x 7 days, improved Nasal congestion is now clear  SOB has improved. Today has walked 1300 steps.  She is doing 'laps around the house' Endorses scratchy throat  Taking mucinex and flonase with some relief.   HA has resolced.  Denies cough, fever , CP, dizziness, leg swelling.      She has been exposed to PNA.   History of allergic rhinitis, GERD, PAD, PNA  Former smoker  No h/o CKD  Allergies: Other and Penicillins Current Outpatient Medications on File Prior to Visit  Medication Sig Dispense Refill   fluticasone (FLONASE) 50 MCG/ACT nasal spray Place into both nostrils daily.     aspirin 81 MG tablet Take 81 mg by mouth daily.     calcium carbonate (OS-CAL - DOSED IN MG OF ELEMENTAL CALCIUM) 1250 (500 Ca) MG tablet Take 1 tablet by mouth daily.     ferrous sulfate 325 (65 FE) MG EC tablet Take 325 mg by mouth daily with breakfast.     hydrochlorothiazide (HYDRODIURIL) 25 MG tablet Take 1 tablet by mouth once daily 90 tablet 1   Misc Natural Products (OSTEO BI-FLEX JOINT SHIELD PO) Take 1 tablet by mouth daily.      Multiple Vitamin (MULTIVITAMIN) capsule Take 1 capsule by mouth daily.     Turmeric 500 MG CAPS Take 1 capsule by mouth daily.     zinc gluconate 50 MG tablet Take 50 mg by mouth daily.     Current Facility-Administered Medications on  File Prior to Visit  Medication Dose Route Frequency Provider Last Rate Last Admin   lidocaine (PF) (XYLOCAINE) 1 % injection 10 mL  10 mL Other Once Delano Metz, MD        Review of Systems    Objective:    BP 136/80   Pulse 84   Temp 97.7 F (36.5 C) (Oral)   Ht 5\' 3"  (1.6 m)   Wt 234 lb (106.1 kg)   SpO2 95%   BMI 41.45 kg/m   BP Readings from Last 3 Encounters:  10/25/23 136/80  07/07/23 126/74  02/01/23 118/74   Wt Readings from Last 3 Encounters:  10/25/23 234 lb (106.1 kg)  07/07/23 233 lb (105.7 kg)  02/01/23 235 lb (106.6 kg)    Physical Exam

## 2023-10-25 NOTE — Patient Instructions (Signed)
Start claritin which is an antihistamine.  I have sent this prescription to your pharmacy.   We will also look at your chest x-ray to ensure you do not have pneumonia.  I suspect this is more likely to be viral.  Certainly if at any point you feel symptoms are worsening versus getting better as we discussed today, please let me know right away

## 2023-10-26 DIAGNOSIS — M65311 Trigger thumb, right thumb: Secondary | ICD-10-CM | POA: Diagnosis not present

## 2023-10-26 NOTE — Assessment & Plan Note (Signed)
Improved. Duration 7 days.  I walkedwith patient up and down the office today and SaO2 remained 95%, heart rate max 101.  She was not labored in speech as we talked.  Shortness of breath has improved.  Advised more likely to be viral in etiology.  Advised to start Claritin.  Pending chest x-ray to ensure no pna.

## 2023-11-08 DIAGNOSIS — M65841 Other synovitis and tenosynovitis, right hand: Secondary | ICD-10-CM | POA: Diagnosis not present

## 2023-11-08 DIAGNOSIS — M65311 Trigger thumb, right thumb: Secondary | ICD-10-CM | POA: Diagnosis not present

## 2023-11-13 ENCOUNTER — Encounter: Payer: Self-pay | Admitting: Family

## 2024-01-08 ENCOUNTER — Encounter: Payer: Self-pay | Admitting: Family Medicine

## 2024-01-08 ENCOUNTER — Ambulatory Visit (INDEPENDENT_AMBULATORY_CARE_PROVIDER_SITE_OTHER): Payer: HMO | Admitting: Family Medicine

## 2024-01-08 VITALS — BP 122/68 | HR 74 | Temp 98.2°F | Resp 18 | Ht 63.0 in | Wt 236.1 lb

## 2024-01-08 DIAGNOSIS — J309 Allergic rhinitis, unspecified: Secondary | ICD-10-CM

## 2024-01-08 DIAGNOSIS — M810 Age-related osteoporosis without current pathological fracture: Secondary | ICD-10-CM | POA: Diagnosis not present

## 2024-01-08 DIAGNOSIS — E785 Hyperlipidemia, unspecified: Secondary | ICD-10-CM | POA: Diagnosis not present

## 2024-01-08 DIAGNOSIS — I10 Essential (primary) hypertension: Secondary | ICD-10-CM | POA: Diagnosis not present

## 2024-01-08 LAB — LIPID PANEL
Cholesterol: 200 mg/dL (ref 0–200)
HDL: 71.5 mg/dL (ref >39.00)
LDL Cholesterol: 109 mg/dL — ABNORMAL HIGH (ref 0–99)
NonHDL: 128.94
Total CHOL/HDL Ratio: 3
Triglycerides: 101 mg/dL (ref 0.0–149.0)
VLDL: 20.2 mg/dL (ref 0.0–40.0)

## 2024-01-08 LAB — COMPREHENSIVE METABOLIC PANEL
ALT: 20 U/L (ref 0–35)
AST: 21 U/L (ref 0–37)
Albumin: 4.1 g/dL (ref 3.5–5.2)
Alkaline Phosphatase: 53 U/L (ref 39–117)
BUN: 17 mg/dL (ref 6–23)
CO2: 33 meq/L — ABNORMAL HIGH (ref 19–32)
Calcium: 9.7 mg/dL (ref 8.4–10.5)
Chloride: 99 meq/L (ref 96–112)
Creatinine, Ser: 0.79 mg/dL (ref 0.40–1.20)
GFR: 70.19 mL/min (ref >60.00)
Glucose, Bld: 95 mg/dL (ref 70–99)
Potassium: 4.1 meq/L (ref 3.5–5.1)
Sodium: 138 meq/L (ref 135–145)
Total Bilirubin: 0.6 mg/dL (ref 0.2–1.2)
Total Protein: 7.3 g/dL (ref 6.0–8.3)

## 2024-01-08 LAB — VITAMIN D 25 HYDROXY (VIT D DEFICIENCY, FRACTURES): VITD: 67.33 ng/mL (ref 30.00–100.00)

## 2024-01-08 LAB — HEMOGLOBIN A1C: Hgb A1c MFr Bld: 5.9 % (ref 4.6–6.5)

## 2024-01-08 MED ORDER — HYDROCHLOROTHIAZIDE 25 MG PO TABS
25.0000 mg | ORAL_TABLET | Freq: Every day | ORAL | 1 refills | Status: DC
Start: 2024-01-08 — End: 2024-04-22

## 2024-01-08 NOTE — Progress Notes (Signed)
Marikay Alar, MD Phone: [phone]  Ann Spencer is a 83 y.o. female who presents today for f/u.  HYPERTENSION/obesity Disease Monitoring Home BP Monitoring not checking Chest pain- no    Dyspnea- no Medications Compliance-  taking HCTZ.   Edema- chronic and unchanged Patient has been walking a mile daily for exercise. BMET    Component Value Date/Time   NA 140 02/01/2023 1122   NA 138 02/14/2014 1329   K 4.5 02/01/2023 1122   K 4.2 02/14/2014 1329   CL 99 02/01/2023 1122   CL 104 02/14/2014 1329   CO2 32 02/01/2023 1122   CO2 26 02/14/2014 1329   GLUCOSE 103 (H) 02/01/2023 1122   GLUCOSE 105 (H) 02/14/2014 1329   BUN 20 02/01/2023 1122   BUN 13 02/14/2014 1329   CREATININE 0.89 02/01/2023 1122   CREATININE 0.85 09/11/2020 1436   CALCIUM 10.3 02/01/2023 1122   CALCIUM 9.4 02/14/2014 1329   GFRNONAA >60 11/25/2019 0500   GFRNONAA 56 (L) 03/05/2015 0145   GFRAA >60 11/25/2019 0500   GFRAA >60 03/05/2015 0145   Allergic rhinitis: Patient notes no congestion or allergy symptoms.  She is no longer on Flonase or Claritin.  Social History   Tobacco Use  Smoking Status Former   Current packs/day: 0.00   Average packs/day: 1 pack/day for 30.0 years (30.0 ttl pk-yrs)   Types: Cigarettes   Start date: 12/13/1963   Quit date: 12/12/1993   Years since quitting: 30.0  Smokeless Tobacco Never    Current Outpatient Medications on File Prior to Visit  Medication Sig Dispense Refill   aspirin 81 MG tablet Take 81 mg by mouth daily.     calcium carbonate (OS-CAL - DOSED IN MG OF ELEMENTAL CALCIUM) 1250 (500 Ca) MG tablet Take 1 tablet by mouth daily.     ferrous sulfate 325 (65 FE) MG EC tablet Take 325 mg by mouth daily with breakfast.     Misc Natural Products (OSTEO BI-FLEX JOINT SHIELD PO) Take 1 tablet by mouth daily.      Multiple Vitamin (MULTIVITAMIN) capsule Take 1 capsule by mouth daily.     Turmeric 500 MG CAPS Take 1 capsule by mouth daily.     zinc gluconate  50 MG tablet Take 50 mg by mouth daily.     Current Facility-Administered Medications on File Prior to Visit  Medication Dose Route Frequency Provider Last Rate Last Admin   lidocaine (PF) (XYLOCAINE) 1 % injection 10 mL  10 mL Other Once Delano Metz, MD         ROS see history of present illness  Objective  Physical Exam Vitals:   01/08/24 1304  BP: 122/68  Pulse: 74  Resp: 18  Temp: 98.2 F (36.8 C)  SpO2: 97%    BP Readings from Last 3 Encounters:  01/08/24 122/68  10/25/23 136/80  07/07/23 126/74   Wt Readings from Last 3 Encounters:  01/08/24 236 lb 2 oz (107.1 kg)  10/25/23 234 lb (106.1 kg)  07/07/23 233 lb (105.7 kg)    Physical Exam Constitutional:      General: She is not in acute distress.    Appearance: She is not diaphoretic.  Cardiovascular:     Rate and Rhythm: Normal rate and regular rhythm.     Heart sounds: Normal heart sounds.  Pulmonary:     Effort: Pulmonary effort is normal.     Breath sounds: Normal breath sounds.  Skin:    General: Skin is warm  and dry.  Neurological:     Mental Status: She is alert.      Assessment/Plan: Please see individual problem list.  Essential hypertension Assessment & Plan: Chronic issue.  Well-controlled.  She will continue HCTZ 25 mg daily.  Orders: -     hydroCHLOROthiazide; Take 1 tablet (25 mg total) by mouth daily.  Dispense: 90 tablet; Refill: 1 -     Hemoglobin A1c -     Lipid panel -     Comprehensive metabolic panel  Morbid obesity (HCC) Assessment & Plan: Chronic issue.  Patient will continue walking 1 mile daily.  She will try to eat a healthy diet.  Orders: -     Hemoglobin A1c  Hyperlipidemia, unspecified hyperlipidemia type -     Lipid panel -     Comprehensive metabolic panel  Age-related osteoporosis without current pathological fracture -     VITAMIN D 25 Hydroxy (Vit-D Deficiency, Fractures)  Allergic rhinitis, unspecified seasonality, unspecified  trigger Assessment & Plan: Asymptomatic.  Monitor for recurrence.       Return in about 6 months (around 07/07/2024) for Transfer of care to [facility].   Marikay Alar, MD [facility]

## 2024-01-08 NOTE — Assessment & Plan Note (Signed)
Asymptomatic.  Monitor for recurrence.

## 2024-01-08 NOTE — Assessment & Plan Note (Signed)
Chronic issue.  Patient will continue walking 1 mile daily.  She will try to eat a healthy diet.

## 2024-01-08 NOTE — Assessment & Plan Note (Signed)
Chronic issue.  Well-controlled.  She will continue HCTZ 25 mg daily.

## 2024-01-09 ENCOUNTER — Encounter: Payer: Self-pay | Admitting: Family Medicine

## 2024-01-15 ENCOUNTER — Telehealth: Payer: Self-pay

## 2024-01-15 NOTE — Telephone Encounter (Signed)
Copied from CRM [id number]. Topic: General - Call Back - No Documentation >> Jan 15, 2024  3:52 PM Samuel Jester B wrote: Reason for CRM: Pt stated that she would like a callback after receiving a call from the office.

## 2024-01-16 NOTE — Telephone Encounter (Signed)
Spoke to pt about labs 

## 2024-04-22 ENCOUNTER — Encounter: Payer: Self-pay | Admitting: Family Medicine

## 2024-04-22 ENCOUNTER — Ambulatory Visit: Admitting: Family Medicine

## 2024-04-22 VITALS — BP 110/62 | HR 76 | Temp 97.5°F | Resp 20 | Ht 63.0 in | Wt 237.1 lb

## 2024-04-22 DIAGNOSIS — J309 Allergic rhinitis, unspecified: Secondary | ICD-10-CM

## 2024-04-22 DIAGNOSIS — J069 Acute upper respiratory infection, unspecified: Secondary | ICD-10-CM

## 2024-04-22 DIAGNOSIS — I10 Essential (primary) hypertension: Secondary | ICD-10-CM | POA: Diagnosis not present

## 2024-04-22 DIAGNOSIS — R011 Cardiac murmur, unspecified: Secondary | ICD-10-CM | POA: Diagnosis not present

## 2024-04-22 DIAGNOSIS — J029 Acute pharyngitis, unspecified: Secondary | ICD-10-CM | POA: Insufficient documentation

## 2024-04-22 DIAGNOSIS — R0600 Dyspnea, unspecified: Secondary | ICD-10-CM | POA: Diagnosis not present

## 2024-04-22 LAB — POCT RAPID STREP A (OFFICE): Rapid Strep A Screen: NEGATIVE

## 2024-04-22 LAB — POC COVID19 BINAXNOW: SARS Coronavirus 2 Ag: NEGATIVE

## 2024-04-22 MED ORDER — ALBUTEROL SULFATE HFA 108 (90 BASE) MCG/ACT IN AERS
2.0000 | INHALATION_SPRAY | Freq: Four times a day (QID) | RESPIRATORY_TRACT | 0 refills | Status: AC | PRN
Start: 2024-04-22 — End: ?

## 2024-04-22 MED ORDER — AZELASTINE HCL 0.1 % NA SOLN
2.0000 | Freq: Two times a day (BID) | NASAL | 1 refills | Status: AC
Start: 2024-04-22 — End: ?

## 2024-04-22 MED ORDER — FLUTICASONE PROPIONATE 50 MCG/ACT NA SUSP
2.0000 | Freq: Two times a day (BID) | NASAL | 1 refills | Status: AC
Start: 2024-04-22 — End: ?

## 2024-04-22 MED ORDER — HYDROCHLOROTHIAZIDE 12.5 MG PO TABS: 12.5000 mg | ORAL_TABLET | Freq: Every day | ORAL | Status: AC

## 2024-04-22 MED ORDER — BENZONATATE 100 MG PO CAPS
200.0000 mg | ORAL_CAPSULE | Freq: Three times a day (TID) | ORAL | 0 refills | Status: AC | PRN
Start: 2024-04-22 — End: ?

## 2024-04-22 NOTE — Progress Notes (Signed)
 SUBJECTIVE:   Chief Complaint  Patient presents with   Cough    Started Saturday   HPI Presents to clinic for acute visit  Discussed the use of AI scribe software for clinical note transcription with the patient, who gave verbal consent to proceed.  History of Present Illness Ann Spencer "Burdette Carolin" is an 83 year old female who presents with upper respiratory symptoms including congestion, sore throat, and headache.  She has been experiencing symptoms of congestion, sore throat, and headache since late Friday night after returning home from the hospital where her daughter was admitted for two weeks. She has been taking cold pills and slept all day on Sunday. No fever has been noted, and she has not measured her temperature.  Her symptoms began with a sore throat and headache on Saturday, following a busy day of errands and caring for her daughter, who has a surgical bag and requires assistance. She avoided contact with her daughter on Sunday to prevent potential contagion.  She notes a runny nose, which she treated with Flonase , providing significant relief. No tooth pain, ear pain, or difficulty swallowing, although her throat remains sore. She is able to eat and drink without issue.  No nausea, vomiting, diarrhea, or fever. She experienced shortness of breath while wearing an N95 mask but is otherwise not short of breath without the mask. She reports mild wheezing.  She has a history of heart palpitations. She quit smoking approximately 30 years ago when her twins were babies.  Her social history includes living with her 14 year old granddaughter, who recently had a scratchy throat but did not miss school. She is on Medicare and has noticed recent changes in her copayments.     PERTINENT PMH / PSH: As above  OBJECTIVE:  BP 110/62   Pulse 76   Temp (!) 97.5 F (36.4 C)   Resp 20   Ht 5\' 3"  (1.6 m)   Wt 237 lb 2 oz (107.6 kg)   SpO2 98%   BMI 42.00 kg/m    Physical  Exam Vitals reviewed.  Constitutional:      General: She is not in acute distress.    Appearance: Normal appearance. She is obese. She is not ill-appearing, toxic-appearing or diaphoretic.  Eyes:     General:        Right eye: No discharge.        Left eye: No discharge.     Conjunctiva/sclera: Conjunctivae normal.  Cardiovascular:     Rate and Rhythm: Normal rate and regular rhythm.     Heart sounds: Normal heart sounds.  Pulmonary:     Effort: Pulmonary effort is normal.     Breath sounds: Normal breath sounds. No wheezing or rhonchi.  Musculoskeletal:        General: Normal range of motion.  Skin:    General: Skin is warm and dry.  Neurological:     General: No focal deficit present.     Mental Status: She is alert and oriented to person, place, and time. Mental status is at baseline.  Psychiatric:        Mood and Affect: Mood normal.        Behavior: Behavior normal.        Thought Content: Thought content normal.        Judgment: Judgment normal.           04/22/2024    1:56 PM 01/08/2024    1:08 PM 07/07/2023    2:06 PM  02/01/2023   11:02 AM 01/27/2023   10:24 AM  Depression screen PHQ 2/9  Decreased Interest 1 0 0 0 0  Down, Depressed, Hopeless 0 0 0 0 0  PHQ - 2 Score 1 0 0 0 0  Altered sleeping 1 1 0 0   Tired, decreased energy 1 0 1 0   Change in appetite 1 0 0 0   Feeling bad or failure about yourself  0 0 0 0   Trouble concentrating 0 0 0 0   Moving slowly or fidgety/restless 0 0 0 0   Suicidal thoughts 0 0 0 0   PHQ-9 Score 4 1 1  0   Difficult doing work/chores Somewhat difficult Not difficult at all Not difficult at all Not difficult at all       04/22/2024    1:56 PM 01/08/2024    1:09 PM 07/07/2023    2:07 PM 02/01/2023   11:02 AM  GAD 7 : Generalized Anxiety Score  Nervous, Anxious, on Edge 0 0 0 0  Control/stop worrying 0 0 0 0  Worry too much - different things 0 0 0 0  Trouble relaxing 0 0 0 0  Restless 0 0 0 0  Easily annoyed or irritable  0 0 1 0  Afraid - awful might happen 0 0 0 0  Total GAD 7 Score 0 0 1 0  Anxiety Difficulty Not difficult at all Not difficult at all Not difficult at all Not difficult at all    ASSESSMENT/PLAN:  Viral upper respiratory tract infection Assessment & Plan: Likely viral infection exacerbating bronchitis. Symptoms improving. No antibiotics or steroids needed. Discussed inhaler use if symptoms worsen. - Increase fluid intake. - Provide antitussive medication. - Albuterol  inhaler as prescribed    Orders: -     POCT rapid strep A -     POC COVID-19 BinaxNow -     Benzonatate ; Take 2 capsules (200 mg total) by mouth 3 (three) times daily as needed.  Dispense: 20 capsule; Refill: 0 -     Albuterol  Sulfate HFA; Inhale 2 puffs into the lungs every 6 (six) hours as needed for wheezing or shortness of breath.  Dispense: 8 g; Refill: 0  Allergic rhinitis, unspecified seasonality, unspecified trigger Assessment & Plan: Recent onset with rhinorrhea and congestion. Flonase  effective.  Orders: -     Fluticasone  Propionate; Place 2 sprays into both nostrils 2 (two) times daily.  Dispense: 16 g; Refill: 1 -     Azelastine  HCl; Place 2 sprays into both nostrils 2 (two) times daily. Use in each nostril as directed  Dispense: 30 mL; Refill: 1  Essential hypertension Assessment & Plan: Well controlled Refill Hydrochlorothiazide  12.5 mg daily Check Cmet  Orders: -     Comprehensive metabolic panel with GFR -     CBC with Differential/Platelet -     hydroCHLOROthiazide ; Take 1 tablet (12.5 mg total) by mouth daily.  Dyspnea, unspecified type -     ECHOCARDIOGRAM COMPLETE  Murmur Assessment & Plan: Previously diagnosed, currently asymptomatic.  Does have some dyspnea periodically. - ECHO to monitor       PDMP reviewed  Return if symptoms worsen or fail to improve, for PCP.  Valli Gaw, MD

## 2024-04-22 NOTE — Patient Instructions (Addendum)
 It was a pleasure meeting you today. Thank you for allowing me to take part in your health care.  Our goals for today as we discussed include:  COVID negative Strep throat negative  Can use Albuterol 1 puff every 6 hours for shortness of breath or wheezing. Flonase  2 sprays 2 times a day Astelin 2 sprays 2 times a day Tessalon  Perls 1 tablet as needed for cough  You can take Tylenol  and/or Ibuprofen  as needed for fever reduction and pain relief.   For cough: honey 1/2 to 1 teaspoon (you can dilute the honey in water or another fluid).  You can also use guaifenesin  and dextromethorphan for cough. You can use a humidifier for chest congestion and cough.  If you don't have a humidifier, you can sit in the bathroom with the hot shower running.      For sore throat: try warm salt water gargles, cepacol lozenges, throat spray, warm tea or water with lemon/honey, popsicles or ice, or OTC cold relief medicine for throat discomfort.   For congestion: take a daily anti-histamine like Zyrtec, Claritin , and a oral decongestant, such as pseudoephedrine.  You can also use Flonase  1-2 sprays in each nostril daily.   It is important to stay hydrated: drink plenty of fluids (water, gatorade/powerade/pedialyte, juices, or teas) to keep your throat moisturized and help further relieve irritation/discomfort.     Blood pressure is a little on the low side Decrease Hydrochlorothiazide  to 12.5 mg daily.  Goal blood pressure is <150/90 If blood pressure begins to rise >130/90 can restart Hydrochlorothiazide  25 mg daily  Have ordered an ECHO to assess heart function and murmur Call [phone] to schedule Echocardiogram at [facility]     This is a list of the screening recommended for you and due dates:  Health Maintenance  Topic Date Due   Zoster (Shingles) Vaccine (1 of 2) Never done   Pneumonia Vaccine (2 of 2 - PPSV23) 02/20/2016   DTaP/Tdap/Td vaccine (2 - Td or Tdap) 12/22/2023   Medicare Annual  Wellness Visit  01/28/2024   COVID-19 Vaccine (3 - 2024-25 season) 03/10/2024   Flu Shot  07/12/2024   DEXA scan (bone density measurement)  Completed   HPV Vaccine  Aged Out   Meningitis B Vaccine  Aged Out      If you have any questions or concerns, please do not hesitate to call the office at [phone].  I look forward to our next visit and until then take care and stay safe.  Regards,   Valli Gaw, MD   [facility]

## 2024-04-23 LAB — CBC WITH DIFFERENTIAL/PLATELET
Basophils Absolute: 0 10*3/uL (ref 0.0–0.1)
Basophils Relative: 0.6 % (ref 0.0–3.0)
Eosinophils Absolute: 0.2 10*3/uL (ref 0.0–0.7)
Eosinophils Relative: 4.3 % (ref 0.0–5.0)
HCT: 44.9 % (ref 36.0–46.0)
Hemoglobin: 15.1 g/dL — ABNORMAL HIGH (ref 12.0–15.0)
Lymphocytes Relative: 25.9 % (ref 12.0–46.0)
Lymphs Abs: 1 10*3/uL (ref 0.7–4.0)
MCHC: 33.5 g/dL (ref 30.0–36.0)
MCV: 97.2 fl (ref 78.0–100.0)
Monocytes Absolute: 0.6 10*3/uL (ref 0.1–1.0)
Monocytes Relative: 14.1 % — ABNORMAL HIGH (ref 3.0–12.0)
Neutro Abs: 2.2 10*3/uL (ref 1.4–7.7)
Neutrophils Relative %: 55.1 % (ref 43.0–77.0)
Platelets: 174 10*3/uL (ref 150.0–400.0)
RBC: 4.62 Mil/uL (ref 3.87–5.11)
RDW: 13 % (ref 11.5–15.5)
WBC: 4 10*3/uL (ref 4.0–10.5)

## 2024-04-23 LAB — COMPREHENSIVE METABOLIC PANEL WITH GFR
ALT: 24 U/L (ref 0–35)
AST: 20 U/L (ref 0–37)
Albumin: 4.1 g/dL (ref 3.5–5.2)
Alkaline Phosphatase: 55 U/L (ref 39–117)
BUN: 20 mg/dL (ref 6–23)
CO2: 32 meq/L (ref 19–32)
Calcium: 9.9 mg/dL (ref 8.4–10.5)
Chloride: 97 meq/L (ref 96–112)
Creatinine, Ser: 0.88 mg/dL (ref 0.40–1.20)
GFR: 61.54 mL/min (ref >60.00)
Glucose, Bld: 87 mg/dL (ref 70–99)
Potassium: 4.3 meq/L (ref 3.5–5.1)
Sodium: 137 meq/L (ref 135–145)
Total Bilirubin: 0.5 mg/dL (ref 0.2–1.2)
Total Protein: 7.3 g/dL (ref 6.0–8.3)

## 2024-04-28 ENCOUNTER — Ambulatory Visit: Payer: Self-pay | Admitting: Family Medicine

## 2024-04-28 ENCOUNTER — Encounter: Payer: Self-pay | Admitting: Family Medicine

## 2024-04-28 DIAGNOSIS — R011 Cardiac murmur, unspecified: Secondary | ICD-10-CM | POA: Insufficient documentation

## 2024-04-28 NOTE — Assessment & Plan Note (Signed)
 Likely viral infection exacerbating bronchitis. Symptoms improving. No antibiotics or steroids needed. Discussed inhaler use if symptoms worsen. - Increase fluid intake. - Provide antitussive medication. - Albuterol  inhaler as prescribed

## 2024-04-28 NOTE — Assessment & Plan Note (Signed)
 Recent onset with rhinorrhea and congestion. Flonase  effective.

## 2024-04-28 NOTE — Assessment & Plan Note (Signed)
 Well controlled Refill Hydrochlorothiazide  12.5 mg daily Check Cmet

## 2024-04-28 NOTE — Assessment & Plan Note (Addendum)
 Previously diagnosed, currently asymptomatic.  Does have some dyspnea periodically. - ECHO to monitor

## 2024-05-10 ENCOUNTER — Ambulatory Visit (INDEPENDENT_AMBULATORY_CARE_PROVIDER_SITE_OTHER): Admitting: *Deleted

## 2024-05-10 VITALS — Ht 63.0 in | Wt 236.0 lb

## 2024-05-10 DIAGNOSIS — Z0000 Encounter for general adult medical examination without abnormal findings: Secondary | ICD-10-CM

## 2024-05-10 NOTE — Patient Instructions (Signed)
 Ann Spencer , Thank you for taking time out of your busy schedule to complete your Annual Wellness Visit with me. I enjoyed our conversation and look forward to speaking with you again next year. I, as well as your care team,  appreciate your ongoing commitment to your health goals. Please review the following plan we discussed and let me know if I can assist you in the future. Your Game plan/ To Do List    Referrals: If you haven't heard from the office you've been referred to, please reach out to them at the phone provided.  Remember to update your tetanus, covid ,shingles and pneumonia vaccine and get a flu shot annually. Follow up Visits: Next Medicare AWV with our clinical staff: 05/13/25 @ 9:30   Have you seen your provider in the last 6 months (3 months if uncontrolled diabetes)? Yes Next Office Visit with your provider: 07/09/24  Clinician Recommendations:  Aim for 30 minutes of exercise or brisk walking, 6-8 glasses of water, and 5 servings of fruits and vegetables each day.       This is a list of the screening recommended for you and due dates:  Health Maintenance  Topic Date Due   Zoster (Shingles) Vaccine (1 of 2) Never done   Pneumonia Vaccine (2 of 2 - PPSV23) 02/20/2016   DTaP/Tdap/Td vaccine (2 - Td or Tdap) 12/22/2023   COVID-19 Vaccine (3 - 2024-25 season) 03/10/2024   Flu Shot  07/12/2024   Medicare Annual Wellness Visit  05/10/2025   DEXA scan (bone density measurement)  Completed   HPV Vaccine  Aged Out   Meningitis B Vaccine  Aged Out    Advanced directives: (ACP Link)Information on Advanced Care Planning can be found at [place]  Best boy Advance Health Care Directives Advance Health Care Directives. [email or web address]  Advance Care Planning is important because it:  [x]  Makes sure you receive the medical care that is consistent with your values, goals, and preferences  [x]  It provides guidance to your family and loved ones and reduces their decisional  burden about whether or not they are making the right decisions based on your wishes.  Follow the link provided in your after visit summary or read over the paperwork we have mailed to you to help you started getting your Advance Directives in place. If you need assistance in completing these, please reach out to us  so that we can help you!

## 2024-05-10 NOTE — Progress Notes (Signed)
 Subjective:   Ann Spencer is a 83 y.o. who presents for a Medicare Wellness preventive visit.  As a reminder, Annual Wellness Visits don't include a physical exam, and some assessments may be limited, especially if this visit is performed virtually. We may recommend an in-person follow-up visit with your provider if needed.  Visit Complete: Virtual I connected with  Ann Spencer on 05/10/24 by a audio enabled telemedicine application and verified that I am speaking with the correct person using two identifiers.  Patient Location: Home  Provider Location: Home Office  I discussed the limitations of evaluation and management by telemedicine. The patient expressed understanding and agreed to proceed.  Vital Signs: Because this visit was a virtual/telehealth visit, some criteria may be missing or patient reported. Any vitals not documented were not able to be obtained and vitals that have been documented are patient reported.  VideoDeclined- This patient declined Librarian, academic. Therefore the visit was completed with audio only.  Persons Participating in Visit: Patient.  AWV Questionnaire: No: Patient Medicare AWV questionnaire was not completed prior to this visit.  Cardiac Risk Factors include: advanced age (>22men, >69 women);hypertension;dyslipidemia;obesity (BMI >30kg/m2)     Objective:     Today's Vitals   05/10/24 0938  Weight: 236 lb (107 kg)  Height: 5\' 3"  (1.6 m)   Body mass index is 41.81 kg/m.     05/10/2024    9:56 AM 01/27/2023   10:17 AM 01/20/2022   11:39 AM 01/19/2021   11:12 AM 01/17/2020   11:17 AM 11/23/2019    6:30 AM 11/22/2019    3:39 PM  Advanced Directives  Does Patient Have a Medical Advance Directive? No No No No No No No  Would patient like information on creating a medical advance directive? No - Patient declined No - Patient declined No - Patient declined No - Patient declined No - Patient declined No - Patient  declined No - Patient declined    Current Medications (verified) Outpatient Encounter Medications as of 05/10/2024  Medication Sig   aspirin  81 MG tablet Take 81 mg by mouth daily.   azelastine  (ASTELIN ) 0.1 % nasal spray Place 2 sprays into both nostrils 2 (two) times daily. Use in each nostril as directed   calcium  carbonate (OS-CAL - DOSED IN MG OF ELEMENTAL CALCIUM ) 1250 (500 Ca) MG tablet Take 1 tablet by mouth daily.   ferrous sulfate  325 (65 FE) MG EC tablet Take 325 mg by mouth daily with breakfast.   fluticasone  (FLONASE ) 50 MCG/ACT nasal spray Place 2 sprays into both nostrils 2 (two) times daily.   hydrochlorothiazide  (HYDRODIURIL ) 12.5 MG tablet Take 1 tablet (12.5 mg total) by mouth daily.   Misc Natural Products (OSTEO BI-FLEX JOINT SHIELD PO) Take 1 tablet by mouth daily.    Multiple Vitamin (MULTIVITAMIN) capsule Take 1 capsule by mouth daily.   Turmeric 500 MG CAPS Take 1 capsule by mouth daily.   zinc gluconate 50 MG tablet Take 50 mg by mouth daily.   albuterol  (VENTOLIN  HFA) 108 (90 Base) MCG/ACT inhaler Inhale 2 puffs into the lungs every 6 (six) hours as needed for wheezing or shortness of breath. (Patient not taking: Reported on 05/10/2024)   benzonatate  (TESSALON  PERLES) 100 MG capsule Take 2 capsules (200 mg total) by mouth 3 (three) times daily as needed. (Patient not taking: Reported on 05/10/2024)   Facility-Administered Encounter Medications as of 05/10/2024  Medication   lidocaine  (PF) (XYLOCAINE ) 1 % injection 10  mL    Allergies (verified) Other and Penicillins   History: Past Medical History:  Diagnosis Date   Allergy    Anemia    Arthritis    Cellulitis    GERD (gastroesophageal reflux disease)    History of head injury 11/17/2015   Hypertension    PAD (peripheral artery disease) (HCC)    Phlebitis 2014   Pneumonia due to infectious organism 02/15/2018   Rotator cuff injury 12/2013   Past Surgical History:  Procedure Laterality Date   ABDOMINAL  HYSTERECTOMY  1981   Full   COLONOSCOPY     SHOULDER SURGERY Left 03/03/2015   TOTAL KNEE ARTHROPLASTY Left 11/05/2019   Procedure: TOTAL KNEE ARTHROPLASTY;  Surgeon: Rande Bushy, MD;  Location: [facility] ORS;  Service: Orthopedics;  Laterality: Left;   TRIGGER FINGER RELEASE Right 2024   vein closure Bilateral 01/2007   Family History  Problem Relation Age of Onset   Varicose Veins Brother    Social History   Socioeconomic History   Marital status: Widowed    Spouse name: Not on file   Number of children: Not on file   Years of education: Not on file   Highest education level: Not on file  Occupational History   Not on file  Tobacco Use   Smoking status: Former    Current packs/day: 0.00    Average packs/day: 1 pack/day for 30.0 years (30.0 ttl pk-yrs)    Types: Cigarettes    Start date: 12/13/1963    Quit date: 12/12/1993    Years since quitting: 30.4   Smokeless tobacco: Never  Vaping Use   Vaping status: Never Used  Substance and Sexual Activity   Alcohol use: Yes    Alcohol/week: 7.0 standard drinks of alcohol    Types: 7 Standard drinks or equivalent per week    Comment: Wine qhs   Drug use: No   Sexual activity: Never  Other Topics Concern   Not on file  Social History Narrative   [name]   Lives with daughter Ann Spencer) and grandaughter    2 dogs lives inside   Enjoys reading   Social Drivers of Health   Financial Resource Strain: Low Risk  (05/10/2024)   Overall Financial Resource Strain (CARDIA)    Difficulty of Paying Living Expenses: Not hard at all  Food Insecurity: No Food Insecurity (05/10/2024)   Hunger Vital Sign    Worried About Running Out of Food in the Last Year: Never true    Ran Out of Food in the Last Year: Never true  Transportation Needs: No Transportation Needs (05/10/2024)   PRAPARE - Administrator, Civil Service (Medical): No    Lack of Transportation (Non-Medical): No  Physical Activity: Insufficiently Active  (05/10/2024)   Exercise Vital Sign    Days of Exercise per Week: 7 days    Minutes of Exercise per Session: 20 min  Stress: No Stress Concern Present (05/10/2024)   [employer] of Occupational Health - Occupational Stress Questionnaire    Feeling of Stress : Not at all  Social Connections: Moderately Isolated (05/10/2024)   Social Connection and Isolation Panel [NHANES]    Frequency of Communication with Friends and Family: More than three times a week    Frequency of Social Gatherings with Friends and Family: More than three times a week    Attends Religious Services: Never    Database administrator or Organizations: Yes    Attends Banker Meetings:  More than 4 times per year    Marital Status: Widowed    Tobacco Counseling Counseling given: Not Answered    Clinical Intake:  Pre-visit preparation completed: Yes  Pain : No/denies pain     BMI - recorded: 41.81 Nutritional Status: BMI > 30  Obese Nutritional Risks: None Diabetes: No  Lab Results  Component Value Date   HGBA1C 5.9 01/08/2024   HGBA1C 5.7 02/01/2023   HGBA1C 5.5 10/28/2019     How often do you need to have someone help you when you read instructions, pamphlets, or other written materials from your doctor or pharmacy?: 1 - Never  Interpreter Needed?: No  Information entered by :: R. Makaley Storts LPN   Activities of Daily Living     05/10/2024    9:40 AM  In your present state of health, do you have any difficulty performing the following activities:  Hearing? 1  Comment some issues  Vision? 0  Comment readers  Difficulty concentrating or making decisions? 0  Walking or climbing stairs? 1  Dressing or bathing? 0  Doing errands, shopping? 0  Preparing Food and eating ? N  Using the Toilet? N  In the past six months, have you accidently leaked urine? Y  Do you have problems with loss of bowel control? N  Managing your Medications? N  Managing your Finances? N  Housekeeping or  managing your Housekeeping? N    Patient Care Team: Jerlean Mood, MD (General Surgery) Hershal Loron, MD (Family Medicine) Vivienne Grove, RN (Inactive) as Nurse Practitioner (Gerontology)  Indicate any recent Medical Services you may have received from other than [facility] providers in the past year (date may be approximate).     Assessment:    This is a routine wellness examination for Thandiwe.  Hearing/Vision screen Hearing Screening - Comments:: Some issues, no aids Vision Screening - Comments:: readers   Goals Addressed             This Visit's Progress    Patient Stated       Wants to lose weight       Depression Screen     05/10/2024    9:49 AM 04/22/2024    1:56 PM 01/08/2024    1:08 PM 07/07/2023    2:06 PM 02/01/2023   11:02 AM 01/27/2023   10:24 AM 09/16/2022    1:58 PM  PHQ 2/9 Scores  PHQ - 2 Score 0 1 0 0 0 0 0  PHQ- 9 Score 0 4 1 1  0      Fall Risk     05/10/2024    9:43 AM 04/22/2024    1:55 PM 01/08/2024    1:08 PM 07/07/2023    2:06 PM 02/01/2023   11:01 AM  Fall Risk   Falls in the past year? 1 1 1 1 1   Number falls in past yr: 1 1 1 1 1   Injury with Fall? 0 0 1 0 0  Risk for fall due to : History of fall(s);Impaired balance/gait Impaired balance/gait  History of fall(s) History of fall(s)  Follow up Falls evaluation completed;Falls prevention discussed Falls evaluation completed;Education provided Falls evaluation completed;Education provided Falls evaluation completed Falls evaluation completed    MEDICARE RISK AT HOME:  Medicare Risk at Home Any stairs in or around the home?: No If so, are there any without handrails?: No Home free of loose throw rugs in walkways, pet beds, electrical cords, etc?: Yes Adequate lighting in your home to reduce  risk of falls?: Yes Life alert?: No Use of a cane, walker or w/c?: Yes Grab bars in the bathroom?: Yes Shower chair or bench in shower?: No Elevated toilet seat or a handicapped toilet?:  No  TIMED UP AND GO:  Was the test performed?  No  Cognitive Function: 6CIT completed        05/10/2024    9:56 AM 01/27/2023   10:37 AM 01/17/2020   11:19 AM 01/08/2019   11:28 AM  6CIT Screen  What Year? 0 points 0 points 0 points 0 points  What month? 0 points 0 points 0 points 0 points  What time? 0 points 0 points 0 points 0 points  Count back from 20 0 points 0 points 0 points 0 points  Months in reverse 0 points 0 points 0 points 0 points  Repeat phrase 0 points 0 points 0 points 0 points  Total Score 0 points 0 points 0 points 0 points    Immunizations Immunization History  Administered Date(s) Administered   Fluad Quad(high Dose 65+) 10/03/2019, 09/11/2020, 10/13/2021, 09/16/2022   Influenza, High Dose Seasonal PF 01/10/2017, 09/04/2017, 09/07/2018   Moderna Sars-Covid-2 Vaccination 04/05/2021   Pneumococcal Conjugate-13 02/20/2015   Tdap 12/21/2013    Screening Tests Health Maintenance  Topic Date Due   Zoster Vaccines- Shingrix (1 of 2) Never done   Pneumonia Vaccine 22+ Years old (2 of 2 - PPSV23) 02/20/2016   DTaP/Tdap/Td (2 - Td or Tdap) 12/22/2023   Medicare Annual Wellness (AWV)  01/28/2024   COVID-19 Vaccine (3 - 2024-25 season) 03/10/2024   INFLUENZA VACCINE  07/12/2024   DEXA SCAN  Completed   HPV VACCINES  Aged Out   Meningococcal B Vaccine  Aged Out    Health Maintenance  Health Maintenance Due  Topic Date Due   Zoster Vaccines- Shingrix (1 of 2) Never done   Pneumonia Vaccine 74+ Years old (2 of 2 - PPSV23) 02/20/2016   DTaP/Tdap/Td (2 - Td or Tdap) 12/22/2023   Medicare Annual Wellness (AWV)  01/28/2024   COVID-19 Vaccine (3 - 2024-25 season) 03/10/2024   Health Maintenance Items Addressed: Discussed the need to have annual flu vaccine and the need to update pneumonia, tetanus, covid and shingles vaccines.  Additional Screening:  Vision Screening: Recommended annual ophthalmology exams for early detection of glaucoma and other  disorders of the eye. Up to date [facility]  Dental Screening: Recommended annual dental exams for proper oral hygiene  Community Resource Referral / Chronic Care Management: CRR required this visit?  No   CCM required this visit?  No   Plan:    I have personally reviewed and noted the following in the patient's chart:   Medical and social history Use of alcohol, tobacco or illicit drugs  Current medications and supplements including opioid prescriptions. Patient is not currently taking opioid prescriptions. Functional ability and status Nutritional status Physical activity Advanced directives List of other physicians Hospitalizations, surgeries, and ER visits in previous 12 months Vitals Screenings to include cognitive, depression, and falls Referrals and appointments  In addition, I have reviewed and discussed with patient certain preventive protocols, quality metrics, and best practice recommendations. A written personalized care plan for preventive services as well as general preventive health recommendations were provided to patient.   Felicitas Horse, LPN   1/61/0960   After Visit Summary: (MyChart) Due to this being a telephonic visit, the after visit summary with patients personalized plan was offered to patient via MyChart   Notes:  Nothing significant to report at this time.

## 2024-07-09 ENCOUNTER — Encounter: Payer: HMO | Admitting: Nurse Practitioner

## 2024-08-19 ENCOUNTER — Encounter: Admitting: Nurse Practitioner

## 2024-08-26 ENCOUNTER — Ambulatory Visit: Payer: Self-pay

## 2024-08-26 NOTE — Telephone Encounter (Addendum)
 FYI Only or Action Required?: FYI only for provider.  Patient was last seen in primary care on 04/22/2024 by Ann Merle, MD.  Called Nurse Triage reporting Dizziness.  Symptoms began several days ago.  Interventions attempted: Rest, hydration, or home remedies.  Symptoms are: completely resolved.  Triage Disposition: See Today or Tomorrow in Office (overriding Home Care) - Refused sooner appt in alternate office, scheduled soonest available in [facility]  Patient/caregiver understands and will follow disposition?: No, refuses disposition Reason for Disposition  Dizziness caused by recent heat exposure  Answer Assessment - Initial Assessment Questions Patient reports episode of dizziness, SOB and sweating on Saturday 08/30/24 after being outside in the heat. States resolved within 10 minutes at rest. Asymptomatic since. Refused earlier appointment at alternate location. 911/ED precautions reviewed, pt verbalized understanding.   1. DESCRIPTION: Describe your dizziness.     States felt lightheaded  2. LIGHTHEADED: Do you feel lightheaded? (e.g., somewhat faint, woozy, weak upon standing)     Somewhat faint  3. SEVERITY: How bad is it?  Do you feel like you are going to faint? Can you stand and walk?     Severe, fully resolved  4. ONSET:  When did the dizziness begin?     Saturday  Protocols used: Dizziness - Lightheadedness-A-AH Copied from CRM #[id number]. Topic: Clinical - Red Word Triage >> Aug 26, 2024 11:14 AM Ivette P wrote: [facility] that prompted transfer to Nurse Triage:  had an episode- little things and overheated and dizzy and short of breath. and weak, had to sit. couldnt get a deep breath. asked to call and get an appointment.   Saturday afternoon.

## 2024-08-30 ENCOUNTER — Ambulatory Visit (INDEPENDENT_AMBULATORY_CARE_PROVIDER_SITE_OTHER): Admitting: Nurse Practitioner

## 2024-08-30 ENCOUNTER — Encounter: Payer: Self-pay | Admitting: Nurse Practitioner

## 2024-08-30 VITALS — BP 96/72 | HR 77 | Temp 97.7°F | Ht 63.0 in | Wt 239.4 lb

## 2024-08-30 DIAGNOSIS — R42 Dizziness and giddiness: Secondary | ICD-10-CM | POA: Diagnosis not present

## 2024-08-30 DIAGNOSIS — I10 Essential (primary) hypertension: Secondary | ICD-10-CM

## 2024-08-30 NOTE — Progress Notes (Signed)
 Established Patient Office Visit  Subjective:  Patient ID: Ann Spencer, female    DOB: 18-May-1941  Age: 83 y.o. MRN: [id number]  CC:  Chief Complaint  Patient presents with   Acute Visit    Last Saturday she had great grandkids over playing ring toss. She started feeling hot like a hotflash, feeling weird, SOB and dizzy so she sent the Grandkids to Nana's and went in and it took a whole to go away then on Tuesday she experienced another hotflash.   Discussed the use of a AI scribe software for clinical note transcription with the patient, who gave verbal consent to proceed.  HPI  Feiga Nadel Scotti Alva is an 83 year old female with hypertension and peripheral artery disease who presents with episodes of dizziness and hot flashes. She is accompanied by her granddaughter, Jolena.  She experienced dizziness and feeling overheated last Saturday while outside with her great-grandchildren. She felt faint, with tingling sensations and shortness of breath, but no chest pain. The episode lasted about 10 minutes, during which she sat in a recliner and was given water.   A milder episode occurred on Tuesday indoors, with a sensation similar to a hot flash, no dizziness or shortness of breath, lasting less than five minutes. She was walking laps around her couch with a walker, which she does daily for exercise. Denise any weakness of one side, vision changes or numbness and tingling sensation.  She has peripheral artery disease and wears compression stockings for chronic leg swelling. She has undergone multiple surgeries for this condition. She has a heart murmur, recently discovered.  Her medication regimen includes hydrochlorothiazide  for hypertension. She follows a diet rich in vegetables, chicken, [place], and fish, avoiding red meat. She drinks a significant amount of coffee and green tea but not water regularly.  HPI   Past Medical History:  Diagnosis Date   Allergy    Anemia    Arthritis     Cellulitis    GERD (gastroesophageal reflux disease)    History of head injury 11/17/2015   Hypertension    PAD (peripheral artery disease)    Phlebitis 2014   Pneumonia due to infectious organism 02/15/2018   Rotator cuff injury 12/2013    Past Surgical History:  Procedure Laterality Date   ABDOMINAL HYSTERECTOMY  1981   Full   COLONOSCOPY     SHOULDER SURGERY Left 03/03/2015   TOTAL KNEE ARTHROPLASTY Left 11/05/2019   Procedure: TOTAL KNEE ARTHROPLASTY;  Surgeon: Marchia Drivers, MD;  Location: [facility] ORS;  Service: Orthopedics;  Laterality: Left;   TRIGGER FINGER RELEASE Right 2024   vein closure Bilateral 01/2007    Family History  Problem Relation Age of Onset   Varicose Veins Brother     Social History   Socioeconomic History   Marital status: Widowed    Spouse name: Not on file   Number of children: Not on file   Years of education: Not on file   Highest education level: Not on file  Occupational History   Not on file  Tobacco Use   Smoking status: Former    Current packs/day: 0.00    Average packs/day: 1 pack/day for 30.0 years (30.0 ttl pk-yrs)    Types: Cigarettes    Start date: 12/13/1963    Quit date: 12/12/1993    Years since quitting: 30.7   Smokeless tobacco: Never  Vaping Use   Vaping status: Never Used  Substance and Sexual Activity   Alcohol use: Yes  Alcohol/week: 7.0 standard drinks of alcohol    Types: 7 Standard drinks or equivalent per week    Comment: Wine qhs   Drug use: No   Sexual activity: Never  Other Topics Concern   Not on file  Social History Narrative    [employer]   Lives with daughter Sherolyn) and grandaughter    2 dogs lives inside   Enjoys reading   Social Drivers of Health   Financial Resource Strain: Low Risk  (05/10/2024)   Overall Financial Resource Strain (CARDIA)    Difficulty of Paying Living Expenses: Not hard at all  Food Insecurity: No Food Insecurity (05/10/2024)   Hunger Vital Sign    Worried  About Running Out of Food in the Last Year: Never true    Ran Out of Food in the Last Year: Never true  Transportation Needs: No Transportation Needs (05/10/2024)   PRAPARE - Administrator, Civil Service (Medical): No    Lack of Transportation (Non-Medical): No  Physical Activity: Insufficiently Active (05/10/2024)   Exercise Vital Sign    Days of Exercise per Week: 7 days    Minutes of Exercise per Session: 20 min  Stress: No Stress Concern Present (05/10/2024)   [employer] of Occupational Health - Occupational Stress Questionnaire    Feeling of Stress : Not at all  Social Connections: Moderately Isolated (05/10/2024)   Social Connection and Isolation Panel    Frequency of Communication with Friends and Family: More than three times a week    Frequency of Social Gatherings with Friends and Family: More than three times a week    Attends Religious Services: Never    Database administrator or Organizations: Yes    Attends Engineer, structural: More than 4 times per year    Marital Status: Widowed  Intimate Partner Violence: Not At Risk (05/10/2024)   Humiliation, Afraid, Rape, and Kick questionnaire    Fear of Current or Ex-Partner: No    Emotionally Abused: No    Physically Abused: No    Sexually Abused: No     Outpatient Medications Prior to Visit  Medication Sig Dispense Refill   albuterol  (VENTOLIN  HFA) 108 (90 Base) MCG/ACT inhaler Inhale 2 puffs into the lungs every 6 (six) hours as needed for wheezing or shortness of breath. 8 g 0   aspirin  81 MG tablet Take 81 mg by mouth daily.     calcium  carbonate (OS-CAL - DOSED IN MG OF ELEMENTAL CALCIUM ) 1250 (500 Ca) MG tablet Take 1 tablet by mouth daily.     ferrous sulfate  325 (65 FE) MG EC tablet Take 325 mg by mouth daily with breakfast.     fluticasone  (FLONASE ) 50 MCG/ACT nasal spray Place 2 sprays into both nostrils 2 (two) times daily. 16 g 1   hydrochlorothiazide  (HYDRODIURIL ) 12.5 MG tablet Take 1  tablet (12.5 mg total) by mouth daily.     Misc Natural Products (OSTEO BI-FLEX JOINT SHIELD PO) Take 1 tablet by mouth daily.      Multiple Vitamin (MULTIVITAMIN) capsule Take 1 capsule by mouth daily.     Turmeric 500 MG CAPS Take 1 capsule by mouth daily.     zinc gluconate 50 MG tablet Take 50 mg by mouth daily.     azelastine  (ASTELIN ) 0.1 % nasal spray Place 2 sprays into both nostrils 2 (two) times daily. Use in each nostril as directed 30 mL 1   benzonatate  (TESSALON  PERLES) 100 MG capsule Take  2 capsules (200 mg total) by mouth 3 (three) times daily as needed. (Patient not taking: Reported on 05/10/2024) 20 capsule 0   Facility-Administered Medications Prior to Visit  Medication Dose Route Frequency Provider Last Rate Last Admin   lidocaine  (PF) (XYLOCAINE ) 1 % injection 10 mL  10 mL Other Once Tanya Glisson, MD        Allergies  Allergen Reactions   Other Shortness Of Breath   Penicillins     Did it involve swelling of the face/tongue/throat, SOB, or low BP? Unknown Did it involve sudden or severe rash/hives, skin peeling, or any reaction on the inside of your mouth or nose? Unknown Did you need to seek medical attention at a hospital or doctor's office? Unknown When did it last happen? Childhood allergy       If all above answers are "NO", may proceed with cephalosporin use.     ROS Review of Systems Negative unless indicated in HPI.    Objective:    Physical Exam Constitutional:      Appearance: Normal appearance.  HENT:     Mouth/Throat:     Mouth: Mucous membranes are moist.  Eyes:     Conjunctiva/sclera: Conjunctivae normal.     Pupils: Pupils are equal, round, and reactive to light.  Cardiovascular:     Rate and Rhythm: Normal rate and regular rhythm.     Pulses: Normal pulses.     Heart sounds: Normal heart sounds.  Pulmonary:     Effort: Pulmonary effort is normal.     Breath sounds: Normal breath sounds.  Abdominal:     General: Bowel sounds are  normal.     Palpations: Abdomen is soft.  Musculoskeletal:     Cervical back: Normal range of motion. No tenderness.  Skin:    General: Skin is warm.     Findings: No bruising.  Neurological:     General: No focal deficit present.     Mental Status: She is alert and oriented to person, place, and time. Mental status is at baseline.  Psychiatric:        Mood and Affect: Mood normal.        Behavior: Behavior normal.        Thought Content: Thought content normal.        Judgment: Judgment normal.     BP 96/72   Pulse 77   Temp 97.7 F (36.5 C)   Ht 5' 3 (1.6 m)   Wt 239 lb 6.4 oz (108.6 kg)   SpO2 95%   BMI 42.41 kg/m  Wt Readings from Last 3 Encounters:  08/30/24 239 lb 6.4 oz (108.6 kg)  05/10/24 236 lb (107 kg)  04/22/24 237 lb 2 oz (107.6 kg)     Health Maintenance  Topic Date Due   Zoster Vaccines- Shingrix (1 of 2) Never done   Pneumococcal Vaccine: 50+ Years (2 of 2 - PCV20 or PCV21) 02/20/2016   DTaP/Tdap/Td (2 - Td or Tdap) 12/22/2023   COVID-19 Vaccine (4 - 2024-25 season) 09/13/2024 (Originally 08/12/2024)   Influenza Vaccine  03/11/2025 (Originally 07/12/2024)   Medicare Annual Wellness (AWV)  05/10/2025   DEXA SCAN  Completed   HPV VACCINES  Aged Out   Meningococcal B Vaccine  Aged Out    There are no preventive care reminders to display for this patient.  Lab Results  Component Value Date   TSH 3.230 08/30/2024   Lab Results  Component Value Date   WBC 4.3 08/30/2024  HGB 14.3 08/30/2024   HCT 44.4 08/30/2024   MCV 100 (H) 08/30/2024   PLT 174 08/30/2024   Lab Results  Component Value Date   NA 140 08/30/2024   K 4.3 08/30/2024   CO2 27 08/30/2024   GLUCOSE 95 08/30/2024   BUN 19 08/30/2024   CREATININE 0.87 08/30/2024   BILITOT 0.5 08/30/2024   ALKPHOS 83 08/30/2024   AST 19 08/30/2024   ALT 28 08/30/2024   PROT 7.0 08/30/2024   ALBUMIN 4.1 08/30/2024   CALCIUM  9.9 08/30/2024   ANIONGAP 12 11/22/2019   EGFR 67 08/30/2024   GFR  61.54 04/22/2024   Lab Results  Component Value Date   CHOL 200 01/08/2024   Lab Results  Component Value Date   HDL 71.50 01/08/2024   Lab Results  Component Value Date   LDLCALC 109 (H) 01/08/2024   Lab Results  Component Value Date   TRIG 101.0 01/08/2024   Lab Results  Component Value Date   CHOLHDL 3 01/08/2024   Lab Results  Component Value Date   HGBA1C 5.9 01/08/2024      Assessment & Plan:  Dizziness Assessment & Plan: Episode of intermittent dizziness, no dizziness at present. Negative orthostatic BP  - Encouraged increased water intake, suggested flavored water with lemon or cucumber. - Will check labs as outlined. - She will let us  know if symptoms reoccur.    Orders: -     TSH Rfx on Abnormal to Free T4 -     CBC with Differential/Platelet -     Comprehensive metabolic panel with GFR -     POCT urinalysis dipstick  Essential hypertension Assessment & Plan: Chronic stable. - Continue hydrochlorothiazide  12.5 mg daily.  -Advised to check blood pressure during the episode of dizziness to rule out hypotension.     Follow-up: Return if symptoms worsen or fail to improve.   Galo Sayed, NP

## 2024-08-31 LAB — COMPREHENSIVE METABOLIC PANEL WITH GFR
ALT: 28 IU/L (ref 0–32)
AST: 19 IU/L (ref 0–40)
Albumin: 4.1 g/dL (ref 3.7–4.7)
Alkaline Phosphatase: 83 IU/L (ref 48–129)
BUN/Creatinine Ratio: 22 (ref 12–28)
BUN: 19 mg/dL (ref 8–27)
Bilirubin Total: 0.5 mg/dL (ref 0.0–1.2)
CO2: 27 mmol/L (ref 20–29)
Calcium: 9.9 mg/dL (ref 8.7–10.3)
Chloride: 99 mmol/L (ref 96–106)
Creatinine, Ser: 0.87 mg/dL (ref 0.57–1.00)
Globulin, Total: 2.9 g/dL (ref 1.5–4.5)
Glucose: 95 mg/dL (ref 70–99)
Potassium: 4.3 mmol/L (ref 3.5–5.2)
Sodium: 140 mmol/L (ref 134–144)
Total Protein: 7 g/dL (ref 6.0–8.5)
eGFR: 67 mL/min/1.73 (ref >59)

## 2024-08-31 LAB — CBC WITH DIFFERENTIAL/PLATELET
Basophils Absolute: 0 x10E3/uL (ref 0.0–0.2)
Basos: 1 %
EOS (ABSOLUTE): 0.1 x10E3/uL (ref 0.0–0.4)
Eos: 3 %
Hematocrit: 44.4 % (ref 34.0–46.6)
Hemoglobin: 14.3 g/dL (ref 11.1–15.9)
Immature Grans (Abs): 0 x10E3/uL (ref 0.0–0.1)
Immature Granulocytes: 0 %
Lymphocytes Absolute: 1 x10E3/uL (ref 0.7–3.1)
Lymphs: 24 %
MCH: 32.1 pg (ref 26.6–33.0)
MCHC: 32.2 g/dL (ref 31.5–35.7)
MCV: 100 fL — ABNORMAL HIGH (ref 79–97)
Monocytes Absolute: 0.4 x10E3/uL (ref 0.1–0.9)
Monocytes: 9 %
Neutrophils Absolute: 2.7 x10E3/uL (ref 1.4–7.0)
Neutrophils: 63 %
Platelets: 174 x10E3/uL (ref 150–450)
RBC: 4.46 x10E6/uL (ref 3.77–5.28)
RDW: 12.1 % (ref 11.7–15.4)
WBC: 4.3 x10E3/uL (ref 3.4–10.8)

## 2024-08-31 LAB — TSH RFX ON ABNORMAL TO FREE T4: TSH: 3.23 u[IU]/mL (ref 0.450–4.500)

## 2024-09-10 ENCOUNTER — Ambulatory Visit: Payer: Self-pay | Admitting: Nurse Practitioner

## 2024-09-10 DIAGNOSIS — R42 Dizziness and giddiness: Secondary | ICD-10-CM | POA: Insufficient documentation

## 2024-09-10 NOTE — Assessment & Plan Note (Signed)
 Chronic stable. - Continue hydrochlorothiazide  12.5 mg daily.  -Advised to check blood pressure during the episode of dizziness to rule out hypotension.

## 2024-09-10 NOTE — Assessment & Plan Note (Signed)
 Episode of intermittent dizziness, no dizziness at present. Negative orthostatic BP  - Encouraged increased water intake, suggested flavored water with lemon or cucumber. - Will check labs as outlined. - She will let us  know if symptoms reoccur.

## 2024-10-29 ENCOUNTER — Encounter: Admitting: Nurse Practitioner

## 2025-02-13 ENCOUNTER — Encounter: Admitting: Nurse Practitioner

## 2025-05-13 ENCOUNTER — Ambulatory Visit
